# Patient Record
Sex: Female | Born: 1965 | Race: White | Hispanic: No | Marital: Married | Smoking: Never smoker
Health system: Southern US, Community
[De-identification: ages and names within clinical notes are randomized; demographics above are authoritative.]

## PROBLEM LIST (undated history)

## (undated) DIAGNOSIS — K219 Gastro-esophageal reflux disease without esophagitis: Secondary | ICD-10-CM

## (undated) DIAGNOSIS — T4145XA Adverse effect of unspecified anesthetic, initial encounter: Secondary | ICD-10-CM

## (undated) DIAGNOSIS — G510 Bell's palsy: Secondary | ICD-10-CM

## (undated) DIAGNOSIS — I499 Cardiac arrhythmia, unspecified: Secondary | ICD-10-CM

## (undated) DIAGNOSIS — J189 Pneumonia, unspecified organism: Secondary | ICD-10-CM

## (undated) DIAGNOSIS — T8859XA Other complications of anesthesia, initial encounter: Secondary | ICD-10-CM

## (undated) DIAGNOSIS — IMO0001 Reserved for inherently not codable concepts without codable children: Secondary | ICD-10-CM

## (undated) DIAGNOSIS — E119 Type 2 diabetes mellitus without complications: Secondary | ICD-10-CM

## (undated) DIAGNOSIS — R42 Dizziness and giddiness: Secondary | ICD-10-CM

## (undated) DIAGNOSIS — I639 Cerebral infarction, unspecified: Secondary | ICD-10-CM

## (undated) DIAGNOSIS — J45909 Unspecified asthma, uncomplicated: Secondary | ICD-10-CM

## (undated) HISTORY — PX: CYSTOSCOPY: SHX5120

## (undated) HISTORY — DX: Cerebral infarction, unspecified: I63.9

## (undated) HISTORY — DX: Unspecified asthma, uncomplicated: J45.909

## (undated) HISTORY — PX: EYE SURGERY: SHX253

## (undated) HISTORY — DX: Bell's palsy: G51.0

---

## 1995-06-16 HISTORY — PX: CHOLECYSTECTOMY: SHX55

## 1998-04-19 ENCOUNTER — Ambulatory Visit (HOSPITAL_COMMUNITY): Admission: RE | Admit: 1998-04-19 | Discharge: 1998-04-19 | Payer: Self-pay | Admitting: Infectious Diseases

## 1998-04-19 ENCOUNTER — Encounter: Admission: RE | Admit: 1998-04-19 | Discharge: 1998-04-19 | Payer: Self-pay | Admitting: Infectious Diseases

## 1999-04-18 ENCOUNTER — Emergency Department (HOSPITAL_COMMUNITY): Admission: EM | Admit: 1999-04-18 | Discharge: 1999-04-18 | Payer: Self-pay | Admitting: Emergency Medicine

## 2001-11-01 ENCOUNTER — Emergency Department (HOSPITAL_COMMUNITY): Admission: EM | Admit: 2001-11-01 | Discharge: 2001-11-01 | Payer: Self-pay | Admitting: *Deleted

## 2004-04-29 ENCOUNTER — Other Ambulatory Visit: Admission: RE | Admit: 2004-04-29 | Discharge: 2004-04-29 | Payer: Self-pay

## 2004-05-31 ENCOUNTER — Emergency Department (HOSPITAL_COMMUNITY): Admission: EM | Admit: 2004-05-31 | Discharge: 2004-05-31 | Payer: Self-pay | Admitting: Emergency Medicine

## 2005-07-29 ENCOUNTER — Emergency Department (HOSPITAL_COMMUNITY): Admission: EM | Admit: 2005-07-29 | Discharge: 2005-07-29 | Payer: Self-pay | Admitting: Emergency Medicine

## 2005-11-09 ENCOUNTER — Emergency Department (HOSPITAL_COMMUNITY): Admission: EM | Admit: 2005-11-09 | Discharge: 2005-11-09 | Payer: Self-pay | Admitting: Emergency Medicine

## 2006-03-24 ENCOUNTER — Emergency Department (HOSPITAL_COMMUNITY): Admission: EM | Admit: 2006-03-24 | Discharge: 2006-03-24 | Payer: Self-pay | Admitting: Emergency Medicine

## 2006-03-29 ENCOUNTER — Ambulatory Visit: Payer: Self-pay | Admitting: Cardiology

## 2006-11-13 ENCOUNTER — Emergency Department (HOSPITAL_COMMUNITY): Admission: EM | Admit: 2006-11-13 | Discharge: 2006-11-14 | Payer: Self-pay | Admitting: Emergency Medicine

## 2006-11-30 ENCOUNTER — Emergency Department (HOSPITAL_COMMUNITY): Admission: EM | Admit: 2006-11-30 | Discharge: 2006-11-30 | Payer: Self-pay | Admitting: Emergency Medicine

## 2007-05-24 ENCOUNTER — Other Ambulatory Visit: Admission: RE | Admit: 2007-05-24 | Discharge: 2007-05-24 | Payer: Self-pay | Admitting: Unknown Physician Specialty

## 2007-05-24 ENCOUNTER — Encounter (INDEPENDENT_AMBULATORY_CARE_PROVIDER_SITE_OTHER): Payer: Self-pay | Admitting: Unknown Physician Specialty

## 2008-06-05 ENCOUNTER — Emergency Department (HOSPITAL_COMMUNITY): Admission: EM | Admit: 2008-06-05 | Discharge: 2008-06-05 | Payer: Self-pay | Admitting: Emergency Medicine

## 2008-06-15 HISTORY — PX: TONSILLECTOMY: SUR1361

## 2010-06-15 ENCOUNTER — Emergency Department (HOSPITAL_COMMUNITY)
Admission: EM | Admit: 2010-06-15 | Discharge: 2010-06-15 | Payer: Self-pay | Source: Home / Self Care | Admitting: Emergency Medicine

## 2010-08-25 LAB — URINALYSIS, ROUTINE W REFLEX MICROSCOPIC
Bilirubin Urine: NEGATIVE
Glucose, UA: NEGATIVE mg/dL
Hgb urine dipstick: NEGATIVE
Ketones, ur: NEGATIVE mg/dL
Nitrite: NEGATIVE
Protein, ur: NEGATIVE mg/dL
Specific Gravity, Urine: 1.02 (ref 1.005–1.030)
Urobilinogen, UA: 0.2 mg/dL (ref 0.0–1.0)
pH: 6.5 (ref 5.0–8.0)

## 2010-08-25 LAB — URINE MICROSCOPIC-ADD ON

## 2010-08-25 LAB — WET PREP, GENITAL
Trich, Wet Prep: NONE SEEN
Yeast Wet Prep HPF POC: NONE SEEN

## 2010-08-25 LAB — GC/CHLAMYDIA PROBE AMP, GENITAL
Chlamydia, DNA Probe: NEGATIVE
GC Probe Amp, Genital: NEGATIVE

## 2010-08-25 LAB — POCT PREGNANCY, URINE: Preg Test, Ur: NEGATIVE

## 2013-06-15 HISTORY — PX: COLONOSCOPY: SHX174

## 2013-10-18 ENCOUNTER — Other Ambulatory Visit: Payer: Self-pay | Admitting: Internal Medicine

## 2013-10-18 DIAGNOSIS — R1011 Right upper quadrant pain: Secondary | ICD-10-CM

## 2013-10-19 ENCOUNTER — Other Ambulatory Visit: Payer: Self-pay

## 2013-10-20 ENCOUNTER — Ambulatory Visit
Admission: RE | Admit: 2013-10-20 | Discharge: 2013-10-20 | Disposition: A | Discharge status: Home / Self Care | Source: Ambulatory Visit | Attending: Internal Medicine | Admitting: Internal Medicine

## 2013-10-20 DIAGNOSIS — R1011 Right upper quadrant pain: Secondary | ICD-10-CM

## 2013-10-24 ENCOUNTER — Other Ambulatory Visit: Payer: Self-pay | Admitting: Internal Medicine

## 2013-10-24 DIAGNOSIS — R109 Unspecified abdominal pain: Secondary | ICD-10-CM

## 2013-10-27 ENCOUNTER — Ambulatory Visit
Admission: RE | Admit: 2013-10-27 | Discharge: 2013-10-27 | Disposition: A | Discharge status: Home / Self Care | Source: Ambulatory Visit | Attending: Internal Medicine | Admitting: Internal Medicine

## 2013-10-27 DIAGNOSIS — R109 Unspecified abdominal pain: Secondary | ICD-10-CM

## 2013-10-27 MED ORDER — IOHEXOL 300 MG/ML  SOLN
100.0000 mL | Freq: Once | INTRAMUSCULAR | Status: AC | PRN
  Administered 2013-10-27: 100 mL via INTRAVENOUS

## 2013-12-19 ENCOUNTER — Other Ambulatory Visit: Payer: Self-pay | Admitting: Gastroenterology

## 2014-02-26 ENCOUNTER — Other Ambulatory Visit: Payer: Self-pay | Admitting: Internal Medicine

## 2014-02-26 DIAGNOSIS — M25512 Pain in left shoulder: Secondary | ICD-10-CM

## 2014-03-08 ENCOUNTER — Ambulatory Visit
Admission: RE | Admit: 2014-03-08 | Discharge: 2014-03-08 | Disposition: A | Discharge status: Home / Self Care | Source: Ambulatory Visit | Attending: Internal Medicine | Admitting: Internal Medicine

## 2014-03-08 DIAGNOSIS — M25512 Pain in left shoulder: Secondary | ICD-10-CM

## 2014-10-30 ENCOUNTER — Other Ambulatory Visit (HOSPITAL_COMMUNITY): Payer: Self-pay | Admitting: Orthopedic Surgery

## 2014-11-08 NOTE — Pre-Procedure Instructions (Signed)
    Melissa [name]  11/08/2014       Your procedure is scheduled on Tuesday, May 7.  [facility] at 5:30 A.M.   Call this number if you have problems the morning of surgery: [phone]              For any other questions, please call [phone], Monday - Friday 8 AM - 4 PM.     Remember:  Do not eat food or drink liquids after midnight Monday, June 6  Take these medicines the morning of surgery with A SIP OF WATER : nebivolol (BYSTOLIC).               Do not take sitaGLIPtin (JANUVIA), metFORMIN (GLUCOPHAGE) the morning of surgery.   Do not wear jewelry, make-up or nail polish.  Do not wear lotions, powders, or perfumes.    Do not shave 48 hours prior to surgery.    Do not bring valuables to the hospital.  [facility] is not responsible for any belongings or valuables.  Contacts, dentures or bridgework may not be worn into surgery.  Leave your suitcase in the car.  After surgery it may be brought to your room.  For patients admitted to the hospital, discharge time will be determined by your treatment team.  Patients discharged the day of surgery will not be allowed to drive home.   Name and phone number of your driver:  -  Special instructions:  Review  [place] - Preparing For Surgery.  Please read over the following fact sheets that you were given. Pain Booklet, Coughing and Deep Breathing and Surgical Site Infection Prevention, How to Manage Your Diabetes Before and After Surgery and What Do I Do About My Diabetes Medications?

## 2014-11-09 ENCOUNTER — Encounter (HOSPITAL_COMMUNITY)
Admission: RE | Admit: 2014-11-09 | Discharge: 2014-11-09 | Disposition: A | Discharge status: Home / Self Care | Source: Ambulatory Visit | Attending: Orthopedic Surgery | Admitting: Orthopedic Surgery

## 2014-11-09 ENCOUNTER — Encounter (HOSPITAL_COMMUNITY): Payer: Self-pay | Admitting: *Deleted

## 2014-11-09 ENCOUNTER — Encounter (HOSPITAL_COMMUNITY): Payer: Self-pay

## 2014-11-09 ENCOUNTER — Other Ambulatory Visit (HOSPITAL_COMMUNITY): Payer: Self-pay | Admitting: Orthopedic Surgery

## 2014-11-09 DIAGNOSIS — E119 Type 2 diabetes mellitus without complications: Secondary | ICD-10-CM | POA: Diagnosis not present

## 2014-11-09 DIAGNOSIS — Z881 Allergy status to other antibiotic agents status: Secondary | ICD-10-CM | POA: Diagnosis not present

## 2014-11-09 DIAGNOSIS — M7502 Adhesive capsulitis of left shoulder: Secondary | ICD-10-CM | POA: Diagnosis not present

## 2014-11-09 DIAGNOSIS — Z888 Allergy status to other drugs, medicaments and biological substances status: Secondary | ICD-10-CM | POA: Diagnosis not present

## 2014-11-09 DIAGNOSIS — Z880 Allergy status to penicillin: Secondary | ICD-10-CM | POA: Diagnosis not present

## 2014-11-09 DIAGNOSIS — K219 Gastro-esophageal reflux disease without esophagitis: Secondary | ICD-10-CM | POA: Diagnosis not present

## 2014-11-09 DIAGNOSIS — Z6841 Body Mass Index (BMI) 40.0 and over, adult: Secondary | ICD-10-CM | POA: Diagnosis not present

## 2014-11-09 DIAGNOSIS — Z01812 Encounter for preprocedural laboratory examination: Secondary | ICD-10-CM | POA: Diagnosis present

## 2014-11-09 DIAGNOSIS — Z9049 Acquired absence of other specified parts of digestive tract: Secondary | ICD-10-CM | POA: Diagnosis not present

## 2014-11-09 DIAGNOSIS — Z794 Long term (current) use of insulin: Secondary | ICD-10-CM | POA: Diagnosis not present

## 2014-11-09 DIAGNOSIS — Z886 Allergy status to analgesic agent status: Secondary | ICD-10-CM | POA: Diagnosis not present

## 2014-11-09 DIAGNOSIS — Z882 Allergy status to sulfonamides status: Secondary | ICD-10-CM | POA: Diagnosis not present

## 2014-11-09 HISTORY — DX: Reserved for inherently not codable concepts without codable children: IMO0001

## 2014-11-09 HISTORY — DX: Pneumonia, unspecified organism: J18.9

## 2014-11-09 HISTORY — DX: Gastro-esophageal reflux disease without esophagitis: K21.9

## 2014-11-09 LAB — HCG, SERUM, QUALITATIVE: Preg, Serum: NEGATIVE

## 2014-11-09 LAB — CBC
HEMATOCRIT: 40.7 % (ref 36.0–46.0)
HEMOGLOBIN: 13.3 g/dL (ref 12.0–15.0)
MCH: 27.5 pg (ref 26.0–34.0)
MCHC: 32.7 g/dL (ref 30.0–36.0)
MCV: 84.1 fL (ref 78.0–100.0)
PLATELETS: 283 10*3/uL (ref 150–400)
RBC: 4.84 MIL/uL (ref 3.87–5.11)
RDW: 14.1 % (ref 11.5–15.5)
WBC: 7.8 10*3/uL (ref 4.0–10.5)

## 2014-11-09 LAB — BASIC METABOLIC PANEL
Anion gap: 7 (ref 5–15)
BUN: 11 mg/dL (ref 6–20)
CO2: 26 mmol/L (ref 22–32)
CREATININE: 0.71 mg/dL (ref 0.44–1.00)
Calcium: 9.4 mg/dL (ref 8.9–10.3)
Chloride: 105 mmol/L (ref 101–111)
GFR calc non Af Amer: >60 mL/min (ref >60)
Glucose, Bld: 251 mg/dL — ABNORMAL HIGH (ref 65–99)
Potassium: 4.4 mmol/L (ref 3.5–5.1)
SODIUM: 138 mmol/L (ref 135–145)

## 2014-11-09 LAB — GLUCOSE, CAPILLARY: Glucose-Capillary: 251 mg/dL — ABNORMAL HIGH (ref 65–99)

## 2014-11-09 NOTE — Progress Notes (Addendum)
Mrs Melissa [name] has Type II diabetes , PCP Dr Claris GowerKahl manages diabetes .  Last A1C was "9 something" and was drawn in May.  50 units Insulin Glargine (Toujeo)  Added recently.  Patient reports fasting runs high and she does take sliding scale Humulog.I instructed patient to take 40 Units of Toujeo the evening before surgery and if CBC > 220 the morning of surgery to take 1/2 of sliding scale dose.   Patient verbalized understanding.  I [facility] labs and [facility].  Patient has a history of palpations and tacycardia had an Echo cardiogram in 2012- "was normal", " I was drinking too much caffeine.  Patient reports that she no longer has palpations, drinking less caffeine. [facility] by Dr Claris GowerKahl, cardiologist.  A1C was 9.0 when drawn 10/22/14.  Dr Claris GowerKahl added Nathen Mayoujeo at that time

## 2014-11-19 MED ORDER — CLINDAMYCIN PHOSPHATE 900 MG/50ML IV SOLN
900.0000 mg | INTRAVENOUS | Status: DC
  Filled 2014-11-19: qty 50

## 2014-11-20 ENCOUNTER — Ambulatory Visit (HOSPITAL_COMMUNITY): Admitting: Anesthesiology

## 2014-11-20 ENCOUNTER — Encounter (HOSPITAL_COMMUNITY)
Admission: RE | Disposition: A | Discharge status: Home / Self Care | Payer: Self-pay | Source: Ambulatory Visit | Attending: Orthopedic Surgery

## 2014-11-20 ENCOUNTER — Ambulatory Visit (HOSPITAL_COMMUNITY)
Admission: RE | Admit: 2014-11-20 | Discharge: 2014-11-20 | Disposition: A | Discharge status: Home / Self Care | Source: Ambulatory Visit | Attending: Orthopedic Surgery | Admitting: Orthopedic Surgery

## 2014-11-20 ENCOUNTER — Encounter (HOSPITAL_COMMUNITY): Payer: Self-pay | Admitting: *Deleted

## 2014-11-20 DIAGNOSIS — K219 Gastro-esophageal reflux disease without esophagitis: Secondary | ICD-10-CM | POA: Insufficient documentation

## 2014-11-20 DIAGNOSIS — Z6841 Body Mass Index (BMI) 40.0 and over, adult: Secondary | ICD-10-CM | POA: Insufficient documentation

## 2014-11-20 DIAGNOSIS — Z882 Allergy status to sulfonamides status: Secondary | ICD-10-CM | POA: Insufficient documentation

## 2014-11-20 DIAGNOSIS — Z881 Allergy status to other antibiotic agents status: Secondary | ICD-10-CM | POA: Insufficient documentation

## 2014-11-20 DIAGNOSIS — E119 Type 2 diabetes mellitus without complications: Secondary | ICD-10-CM | POA: Insufficient documentation

## 2014-11-20 DIAGNOSIS — Z886 Allergy status to analgesic agent status: Secondary | ICD-10-CM | POA: Insufficient documentation

## 2014-11-20 DIAGNOSIS — M7502 Adhesive capsulitis of left shoulder: Secondary | ICD-10-CM | POA: Insufficient documentation

## 2014-11-20 DIAGNOSIS — Z794 Long term (current) use of insulin: Secondary | ICD-10-CM | POA: Insufficient documentation

## 2014-11-20 DIAGNOSIS — Z888 Allergy status to other drugs, medicaments and biological substances status: Secondary | ICD-10-CM | POA: Insufficient documentation

## 2014-11-20 DIAGNOSIS — Z880 Allergy status to penicillin: Secondary | ICD-10-CM | POA: Insufficient documentation

## 2014-11-20 DIAGNOSIS — Z9049 Acquired absence of other specified parts of digestive tract: Secondary | ICD-10-CM | POA: Insufficient documentation

## 2014-11-20 HISTORY — DX: Other complications of anesthesia, initial encounter: T88.59XA

## 2014-11-20 HISTORY — DX: Adverse effect of unspecified anesthetic, initial encounter: T41.45XA

## 2014-11-20 HISTORY — DX: Cardiac arrhythmia, unspecified: I49.9

## 2014-11-20 HISTORY — PX: SHOULDER ARTHROSCOPY: SHX128

## 2014-11-20 HISTORY — DX: Type 2 diabetes mellitus without complications: E11.9

## 2014-11-20 LAB — GLUCOSE, CAPILLARY
GLUCOSE-CAPILLARY: 190 mg/dL — AB (ref 65–99)
Glucose-Capillary: 142 mg/dL — ABNORMAL HIGH (ref 65–99)

## 2014-11-20 SURGERY — ARTHROSCOPY, SHOULDER
Anesthesia: Regional | Site: Shoulder | Laterality: Left

## 2014-11-20 MED ORDER — SUCCINYLCHOLINE CHLORIDE 20 MG/ML IJ SOLN
INTRAMUSCULAR | Status: DC | PRN
  Administered 2014-11-20: 100 mg via INTRAVENOUS

## 2014-11-20 MED ORDER — MIDAZOLAM HCL 2 MG/2ML IJ SOLN
INTRAMUSCULAR | Status: AC
  Filled 2014-11-20: qty 2

## 2014-11-20 MED ORDER — CHLORHEXIDINE GLUCONATE 4 % EX LIQD: 60.0000 mL | Freq: Once | CUTANEOUS | Status: DC

## 2014-11-20 MED ORDER — EPINEPHRINE HCL 1 MG/ML IJ SOLN
INTRAMUSCULAR | Status: DC | PRN
  Administered 2014-11-20: .1 mL

## 2014-11-20 MED ORDER — FENTANYL CITRATE (PF) 100 MCG/2ML IJ SOLN
INTRAMUSCULAR | Status: DC | PRN
  Administered 2014-11-20: 100 ug via INTRAVENOUS

## 2014-11-20 MED ORDER — NEOSTIGMINE METHYLSULFATE 10 MG/10ML IV SOLN
INTRAVENOUS | Status: DC | PRN
  Administered 2014-11-20: 5 mg via INTRAVENOUS

## 2014-11-20 MED ORDER — PHENYLEPHRINE 40 MCG/ML (10ML) SYRINGE FOR IV PUSH (FOR BLOOD PRESSURE SUPPORT)
PREFILLED_SYRINGE | INTRAVENOUS | Status: AC
  Filled 2014-11-20: qty 20

## 2014-11-20 MED ORDER — KETOROLAC TROMETHAMINE 30 MG/ML IJ SOLN
INTRAMUSCULAR | Status: DC | PRN
  Administered 2014-11-20: 30 mg via INTRAMUSCULAR

## 2014-11-20 MED ORDER — SODIUM CHLORIDE 0.9 % IJ SOLN
INTRAMUSCULAR | Status: DC | PRN
  Administered 2014-11-20: 30 mL

## 2014-11-20 MED ORDER — SODIUM CHLORIDE 0.9 % IR SOLN
Status: DC | PRN
  Administered 2014-11-20: 9000 mL

## 2014-11-20 MED ORDER — VANCOMYCIN HCL IN DEXTROSE 1-5 GM/200ML-% IV SOLN
1000.0000 mg | INTRAVENOUS | Status: DC
  Filled 2014-11-20: qty 200

## 2014-11-20 MED ORDER — MORPHINE SULFATE 4 MG/ML IJ SOLN
INTRAMUSCULAR | Status: DC | PRN
  Administered 2014-11-20: 8 mg via INTRAMUSCULAR

## 2014-11-20 MED ORDER — LIDOCAINE HCL (CARDIAC) 20 MG/ML IV SOLN
INTRAVENOUS | Status: DC | PRN
  Administered 2014-11-20: 50 mg via INTRAVENOUS

## 2014-11-20 MED ORDER — BUPIVACAINE-EPINEPHRINE (PF) 0.5% -1:200000 IJ SOLN
INTRAMUSCULAR | Status: AC
  Filled 2014-11-20: qty 30

## 2014-11-20 MED ORDER — GLYCOPYRROLATE 0.2 MG/ML IJ SOLN
INTRAMUSCULAR | Status: AC
  Filled 2014-11-20: qty 3

## 2014-11-20 MED ORDER — PHENYLEPHRINE HCL 10 MG/ML IJ SOLN
INTRAMUSCULAR | Status: DC | PRN
  Administered 2014-11-20: 80 ug via INTRAVENOUS
  Administered 2014-11-20: 40 ug via INTRAVENOUS
  Administered 2014-11-20 (×2): 80 ug via INTRAVENOUS
  Administered 2014-11-20 (×2): 40 ug via INTRAVENOUS

## 2014-11-20 MED ORDER — VANCOMYCIN HCL 1000 MG IV SOLR
1000.0000 mg | INTRAVENOUS | Status: DC | PRN
  Administered 2014-11-20: 1000 mg via INTRAVENOUS

## 2014-11-20 MED ORDER — MORPHINE SULFATE 4 MG/ML IJ SOLN
INTRAMUSCULAR | Status: AC
  Filled 2014-11-20: qty 2

## 2014-11-20 MED ORDER — OXYCODONE-ACETAMINOPHEN 5-325 MG PO TABS: 1.0000 | ORAL_TABLET | Freq: Four times a day (QID) | ORAL | Status: DC | PRN

## 2014-11-20 MED ORDER — BUPIVACAINE-EPINEPHRINE (PF) 0.5% -1:200000 IJ SOLN
INTRAMUSCULAR | Status: DC | PRN
  Administered 2014-11-20: 25 mL via PERINEURAL

## 2014-11-20 MED ORDER — LACTATED RINGERS IV SOLN
INTRAVENOUS | Status: DC | PRN
  Administered 2014-11-20 (×2): via INTRAVENOUS

## 2014-11-20 MED ORDER — MEPERIDINE HCL 25 MG/ML IJ SOLN: 6.2500 mg | INTRAMUSCULAR | Status: DC | PRN

## 2014-11-20 MED ORDER — GLYCOPYRROLATE 0.2 MG/ML IJ SOLN
INTRAMUSCULAR | Status: DC | PRN
  Administered 2014-11-20: 0.6 mg via INTRAVENOUS

## 2014-11-20 MED ORDER — NEOSTIGMINE METHYLSULFATE 10 MG/10ML IV SOLN
INTRAVENOUS | Status: AC
  Filled 2014-11-20: qty 1

## 2014-11-20 MED ORDER — HYDROMORPHONE HCL 1 MG/ML IJ SOLN
0.2500 mg | INTRAMUSCULAR | Status: DC | PRN
  Administered 2014-11-20 (×2): 0.5 mg via INTRAVENOUS

## 2014-11-20 MED ORDER — ONDANSETRON HCL 4 MG/2ML IJ SOLN
INTRAMUSCULAR | Status: DC | PRN
  Administered 2014-11-20: 4 mg via INTRAVENOUS

## 2014-11-20 MED ORDER — MIDAZOLAM HCL 5 MG/5ML IJ SOLN
INTRAMUSCULAR | Status: DC | PRN
  Administered 2014-11-20: 2 mg via INTRAVENOUS

## 2014-11-20 MED ORDER — PROPOFOL 10 MG/ML IV BOLUS
INTRAVENOUS | Status: AC
  Filled 2014-11-20: qty 20

## 2014-11-20 MED ORDER — HYDROMORPHONE HCL 1 MG/ML IJ SOLN
INTRAMUSCULAR | Status: DC
Start: 2014-11-20 — End: 2014-11-20
  Filled 2014-11-20: qty 1

## 2014-11-20 MED ORDER — FENTANYL CITRATE (PF) 250 MCG/5ML IJ SOLN
INTRAMUSCULAR | Status: AC
  Filled 2014-11-20: qty 5

## 2014-11-20 MED ORDER — OXYCODONE HCL 5 MG/5ML PO SOLN: 5.0000 mg | Freq: Once | ORAL | Status: DC | PRN

## 2014-11-20 MED ORDER — OXYCODONE HCL 5 MG PO TABS: 5.0000 mg | ORAL_TABLET | Freq: Once | ORAL | Status: DC | PRN

## 2014-11-20 MED ORDER — ROCURONIUM BROMIDE 100 MG/10ML IV SOLN
INTRAVENOUS | Status: DC | PRN
  Administered 2014-11-20 (×2): 10 mg via INTRAVENOUS

## 2014-11-20 MED ORDER — EPINEPHRINE HCL 1 MG/ML IJ SOLN
INTRAMUSCULAR | Status: AC
  Filled 2014-11-20: qty 1

## 2014-11-20 MED ORDER — BUPIVACAINE-EPINEPHRINE (PF) 0.5% -1:200000 IJ SOLN
INTRAMUSCULAR | Status: DC | PRN
  Administered 2014-11-20: 10 mL

## 2014-11-20 MED ORDER — SCOPOLAMINE 1 MG/3DAYS TD PT72: 1.0000 | MEDICATED_PATCH | TRANSDERMAL | Status: DC

## 2014-11-20 MED ORDER — PROPOFOL 10 MG/ML IV BOLUS
INTRAVENOUS | Status: DC | PRN
  Administered 2014-11-20: 180 mg via INTRAVENOUS

## 2014-11-20 MED ORDER — ONDANSETRON HCL 4 MG/2ML IJ SOLN
INTRAMUSCULAR | Status: AC
  Filled 2014-11-20: qty 2

## 2014-11-20 SURGICAL SUPPLY — 74 items
BENZOIN TINCTURE PRP APPL 2/3 (GAUZE/BANDAGES/DRESSINGS) ×3 IMPLANT
BIT DRILL TAK (DRILL) IMPLANT
BLADE CUDA 5.5 (BLADE) IMPLANT
BLADE CUTTER GATOR 3.5 (BLADE) ×3 IMPLANT
BLADE GREAT WHITE 4.2 (BLADE) ×2 IMPLANT
BLADE GREAT WHITE 4.2MM (BLADE) ×1
BLADE SURG 11 STRL SS (BLADE) ×3 IMPLANT
BUR GATOR 2.9 (BURR) IMPLANT
BUR GATOR 2.9MM (BURR)
BUR OVAL 4.0 (BURR) ×3 IMPLANT
BUR OVAL 6.0 (BURR) ×3 IMPLANT
CANNULA SHOULDER 7CM (CANNULA) IMPLANT
CARTRIDGE CURVETEK MED (MISCELLANEOUS) IMPLANT
CARTRIDGE CURVETEK XLRG (MISCELLANEOUS) IMPLANT
CLOSURE WOUND 1/2 X4 (GAUZE/BANDAGES/DRESSINGS) ×1
COVER SURGICAL LIGHT HANDLE (MISCELLANEOUS) ×3 IMPLANT
DRAPE IMP U-DRAPE 54X76 (DRAPES) ×3 IMPLANT
DRAPE INCISE IOBAN 66X45 STRL (DRAPES) ×6 IMPLANT
DRAPE STERI 35X30 U-POUCH (DRAPES) ×3 IMPLANT
DRAPE U-SHAPE 47X51 STRL (DRAPES) ×6 IMPLANT
DRILL TAK (DRILL)
DRSG MEPILEX BORDER 4X4 (GAUZE/BANDAGES/DRESSINGS) ×3 IMPLANT
DRSG MEPILEX BORDER 4X8 (GAUZE/BANDAGES/DRESSINGS) ×3 IMPLANT
DRSG PAD ABDOMINAL 8X10 ST (GAUZE/BANDAGES/DRESSINGS) ×9 IMPLANT
DURAPREP 26ML APPLICATOR (WOUND CARE) ×3 IMPLANT
ELECT MENISCUS 165MM 90D (ELECTRODE) IMPLANT
ELECT REM PT RETURN 9FT ADLT (ELECTROSURGICAL) ×3
ELECTRODE REM PT RTRN 9FT ADLT (ELECTROSURGICAL) ×1 IMPLANT
FILTER STRAW FLUID ASPIR (MISCELLANEOUS) ×3 IMPLANT
GAUZE SPONGE 4X4 12PLY STRL (GAUZE/BANDAGES/DRESSINGS) ×3 IMPLANT
GAUZE XEROFORM 1X8 LF (GAUZE/BANDAGES/DRESSINGS) ×3 IMPLANT
GLOVE BIO SURGEON ST LM GN SZ9 (GLOVE) ×3 IMPLANT
GLOVE BIOGEL PI IND STRL 8 (GLOVE) ×1 IMPLANT
GLOVE BIOGEL PI INDICATOR 8 (GLOVE) ×2
GLOVE SURG ORTHO 8.0 STRL STRW (GLOVE) ×3 IMPLANT
GOWN STRL REUS W/ TWL LRG LVL3 (GOWN DISPOSABLE) ×3 IMPLANT
GOWN STRL REUS W/TWL LRG LVL3 (GOWN DISPOSABLE) ×6
KIT BASIN OR (CUSTOM PROCEDURE TRAY) ×3 IMPLANT
KIT ROOM TURNOVER OR (KITS) ×3 IMPLANT
MANIFOLD NEPTUNE II (INSTRUMENTS) ×3 IMPLANT
NDL SUT 6 .5 CRC .975X.05 MAYO (NEEDLE) ×1 IMPLANT
NEEDLE HYPO 25X1 1.5 SAFETY (NEEDLE) ×3 IMPLANT
NEEDLE MAYO TAPER (NEEDLE) ×2
NEEDLE SPNL 18GX3.5 QUINCKE PK (NEEDLE) ×3 IMPLANT
NS IRRIG 1000ML POUR BTL (IV SOLUTION) ×3 IMPLANT
PACK SHOULDER (CUSTOM PROCEDURE TRAY) ×3 IMPLANT
PACK UNIVERSAL I (CUSTOM PROCEDURE TRAY) ×3 IMPLANT
PAD ARMBOARD 7.5X6 YLW CONV (MISCELLANEOUS) ×6 IMPLANT
SET ARTHROSCOPY TUBING (MISCELLANEOUS) ×2
SET ARTHROSCOPY TUBING LN (MISCELLANEOUS) ×1 IMPLANT
SLING ARM IMMOBILIZER MED (SOFTGOODS) IMPLANT
SLING ARM IMMOBILIZER XL (CAST SUPPLIES) ×3 IMPLANT
SPEAR FASTAKII (SLEEVE) IMPLANT
SPONGE LAP 4X18 X RAY DECT (DISPOSABLE) ×6 IMPLANT
STRIP CLOSURE SKIN 1/2X4 (GAUZE/BANDAGES/DRESSINGS) ×2 IMPLANT
SUCTION FRAZIER TIP 10 FR DISP (SUCTIONS) ×3 IMPLANT
SUT ETHILON 3 0 PS 1 (SUTURE) ×3 IMPLANT
SUT FIBERWIRE 2-0 18 17.9 3/8 (SUTURE)
SUT PROLENE 3 0 PS 2 (SUTURE) ×3 IMPLANT
SUT VIC AB 0 CT1 27 (SUTURE) ×4
SUT VIC AB 0 CT1 27XBRD ANBCTR (SUTURE) ×2 IMPLANT
SUT VIC AB 1 CT1 27 (SUTURE) ×2
SUT VIC AB 1 CT1 27XBRD ANBCTR (SUTURE) ×1 IMPLANT
SUT VIC AB 2-0 CT1 27 (SUTURE) ×2
SUT VIC AB 2-0 CT1 TAPERPNT 27 (SUTURE) ×1 IMPLANT
SUT VICRYL 0 UR6 27IN ABS (SUTURE) IMPLANT
SUTURE FIBERWR 2-0 18 17.9 3/8 (SUTURE) IMPLANT
SYR 20CC LL (SYRINGE) ×6 IMPLANT
SYR 3ML LL SCALE MARK (SYRINGE) ×3 IMPLANT
SYR TB 1ML LUER SLIP (SYRINGE) ×3 IMPLANT
TOWEL OR 17X24 6PK STRL BLUE (TOWEL DISPOSABLE) ×3 IMPLANT
TOWEL OR 17X26 10 PK STRL BLUE (TOWEL DISPOSABLE) ×3 IMPLANT
WAND HAND CNTRL MULTIVAC 90 (MISCELLANEOUS) IMPLANT
WATER STERILE IRR 1000ML POUR (IV SOLUTION) ×3 IMPLANT

## 2014-11-20 NOTE — Anesthesia Preprocedure Evaluation (Signed)
Anesthesia Evaluation  Patient identified by MRN, date of birth, ID band Patient awake    Reviewed: Allergy & Precautions, NPO status , Patient's Chart, lab work & pertinent test results  History of Anesthesia Complications (+) PONV  Airway Mallampati: I  TM Distance: >3 FB Neck ROM: Full    Dental  (+) Teeth Intact, Dental Advisory Given   Pulmonary  breath sounds clear to auscultation        Cardiovascular Rhythm:Regular Rate:Normal     Neuro/Psych    GI/Hepatic GERD-  Medicated and Controlled,  Endo/Other  diabetes, Well Controlled, Type 2, Insulin DependentMorbid obesity  Renal/GU      Musculoskeletal   Abdominal   Peds  Hematology   Anesthesia Other Findings   Reproductive/Obstetrics                             Anesthesia Physical Anesthesia Plan  ASA: III  Anesthesia Plan: General and Regional   Post-op Pain Management:    Induction: Intravenous  Airway Management Planned: Oral ETT  Additional Equipment:   Intra-op Plan:   Post-operative Plan: Extubation in OR  Informed Consent: I have reviewed the patients History and Physical, chart, labs and discussed the procedure including the risks, benefits and alternatives for the proposed anesthesia with the patient or authorized representative who has indicated his/her understanding and acceptance.   Dental advisory given  Plan Discussed with: CRNA, Surgeon and Anesthesiologist  Anesthesia Plan Comments:         Anesthesia Quick Evaluation

## 2014-11-20 NOTE — Anesthesia Postprocedure Evaluation (Signed)
Anesthesia Post Note  Patient: Melissa [name]  Procedure(s) Performed: Procedure(s) (LRB): ARTHROSCOPY SHOULDER WITH MUA, ROTATOR INTERVAL RELEASE (Left)  Anesthesia type: general  Patient location: PACU  Post pain: Pain level controlled  Post assessment: Patient's Cardiovascular Status Stable  Last Vitals:  Filed Vitals:   11/20/14 1024  BP: 122/70  Pulse: 85  Temp:   Resp: 18    Post vital signs: Reviewed and stable  Level of consciousness: sedated  Complications: No apparent anesthesia complications

## 2014-11-20 NOTE — Transfer of Care (Signed)
Immediate Anesthesia Transfer of Care Note  Patient: Melissa [name]  Procedure(s) Performed: Procedure(s) with comments: ARTHROSCOPY SHOULDER WITH MUA, ROTATOR INTERVAL RELEASE (Left) - LEFT SHOULDER MUA, DOA, ROTATOR INTERVAL RELEASE.  Patient Location: PACU  Anesthesia Type:General  Level of Consciousness: awake, alert , oriented, patient cooperative and responds to stimulation  Airway & Oxygen Therapy: Patient Spontanous Breathing and Patient connected to nasal cannula oxygen  Post-op Assessment: Report given to RN, Post -op Vital signs reviewed and stable and Patient able to stick tongue midline  Post vital signs: stable  Last Vitals:  Filed Vitals:   11/20/14 0548  BP: 116/47  Pulse: 91  Temp: 36.8 C  Resp: 20    Complications: No apparent anesthesia complications

## 2014-11-20 NOTE — Op Note (Signed)
Melissa [name], Melissa [name]                   ACCOUNT NO.:  [id number]  MEDICAL RECORD NO.:  [id number]  LOCATION:  [facility]                         FACILITY:  [facility]  PHYSICIAN:  Burnard BuntingG. Scott Dean, M.D.    DATE OF BIRTH:  1965-10-22  DATE OF PROCEDURE: DATE OF DISCHARGE:  11/20/2014                              OPERATIVE REPORT   PREOPERATIVE DIAGNOSIS:  Left frozen shoulder.  PREOPERATIVE DIAGNOSIS:  Left frozen shoulder.  PROCEDURE:  Left shoulder manipulation under anesthesia with arthroscopy, rotator interval release and subacromial bursectomy without acromioplasty and without coracoacromial ligament release.  ATTENDING SURGEON:  Burnard BuntingG. Scott Dean, M.D.  ASSISTANT:  Patrick Jupiterarla Bethune, RNFA.  INDICATIONS:  Melissa [name] is a 49 year old patient with left frozen shoulder, refractory to nonoperative management, presents now for operative management after explanation of risks and benefits.  OPERATIVE FINDINGS:  Examination under anesthesia.  Pre-manipulation range of motion, forward flexion about 140, external rotation at 15 degrees, abduction was about 40 degrees, isolated glenohumeral abduction was about 80.  Postmanipulation, forward flexion 180, external rotation at 15 degrees, abduction was about 50, isolated glenohumeral abduction 100 degrees.  PROCEDURE IN DETAIL:  The patient was brought to the operating room where general anesthetic was induced.  IV antibiotics were administered. Time-out was called.  Left shoulder manipulated into full forward flexion and abduction with some external rotation with care being taken to avoid rotational torque on the proximal humerus.  At this time, shoulder was prescrubbed with alcohol and Betadine, allowed to air dry, prepped with DuraPrep solution, draped in sterile manner.  Posterior portal created 2 cm medial and inferior to the posterolateral margin of the acromion.  Diagnostic arthroscopy was performed.  Anterior portal was then created under direct  visualization.  Rotator cuff was intact. Glenohumeral surfaces were intact.  The patient did have significant synovitis in and around the biceps anchor with a stable biceps anchor, but significant synovitis also within the rotator interval.  This was debrided with the ArthroCare wand.  Rotator interval was released longitudinally along the superior border of the intraarticular subscap out to the biceps tendon.  Following this rotator interval release, the scope was placed in the subacromial space.  Bursectomy was performed at that time.  The patient after rotator release had external rotation about 70 degrees.  At this time, thorough irrigation was performed of the joint in the subacromial space under direct visualization.  A solution of Marcaine, morphine, and Toradol was injected into the joint.  The portals were then closed using 3-0 nylon.  The patient was placed in a sling for one day only until the block wears off.  She will start on CPM machine today.     Burnard BuntingG. Scott Dean, M.D.     GSD/MEDQ  D:  11/20/2014  T:  11/20/2014  Job:  [id number]

## 2014-11-20 NOTE — Anesthesia Procedure Notes (Addendum)
Anesthesia Regional Block:  Interscalene brachial plexus block  Pre-Anesthetic Checklist: ,, timeout performed, Correct Patient, Correct Site, Correct Laterality, Correct Procedure, Correct Position, site marked, Risks and benefits discussed,  Surgical consent,  Pre-op evaluation,  At surgeon's request and post-op pain management  Laterality: Left and Upper  Prep: chloraprep       Needles:  Injection technique: Single-shot  Needle Type: Echogenic Needle     Needle Length: 5cm 5 cm Needle Gauge: 21 and 21 G    Additional Needles:  Procedures: ultrasound guided (picture in chart) Interscalene brachial plexus block Narrative:  Start time: 11/20/2014 7:03 AM End time: 11/20/2014 7:08 AM Injection made incrementally with aspirations every 5 mL.  Performed by: Personally  Anesthesiologist: CREWS, DAVID   Procedure Name: Intubation Date/Time: 11/20/2014 7:41 AM Performed by: Marylyn IshiharaFURLOW, Michell Giuliano Pre-anesthesia Checklist: Patient identified, Emergency Drugs available, Suction available, Patient being monitored and Timeout performed Patient Re-evaluated:Patient Re-evaluated prior to inductionOxygen Delivery Method: Circle system utilized Preoxygenation: Pre-oxygenation with 100% oxygen Intubation Type: IV induction Ventilation: Mask ventilation without difficulty Laryngoscope Size: Mac and 3 Grade View: Grade I Tube type: Oral Tube size: 7.0 mm Number of attempts: 1 Placement Confirmation: ETT inserted through vocal cords under direct vision,  positive ETCO2 and breath sounds checked- equal and bilateral Secured at: 22 cm Tube secured with: Tape

## 2014-11-20 NOTE — Addendum Note (Signed)
Addendum  created 11/20/14 1649 by Marylyn Ishiharaiane Montay Vanvoorhis, CRNA   Modules edited: Anesthesia Attestations, Anesthesia Events, Narrator   Narrator:  Narrator: Event Log Edited

## 2014-11-20 NOTE — H&P (Signed)
Melissa [name] is an 49 y.o. female.   Chief Complaint: Left shoulder pain and stiffness HPI: Melissa [name] is a 49 year old female with left shoulder pain and stiffness for over 6 months. Denies any history of injury or trauma. She has had multiple episodes of nonoperative treatment including anti-inflammatory disease injection home access program to try to improve stretching however she reports report persistent pain and stiffness in the left shoulder. Denies any neck symptoms or numbness and tingling in the arm. Presents now for manipulation under anesthesia  and rotator interval release.  Past Medical History  Diagnosis Date  . Complication of anesthesia   . PONV (postoperative nausea and vomiting)   . Diabetes mellitus without complication     Type II  . Dysrhythmia     Tachycardia  . Shortness of breath dyspnea     with exertion  . Pneumonia     2014- 'Walking"  . GERD (gastroesophageal reflux disease)     Past Surgical History  Procedure Laterality Date  . Tonsillectomy  2010  . Colonoscopy  2015  . Cholecystectomy  1997  . Cystoscopy      age 42- stretched stem of bladder  . Eye surgery      Baby- Strabismus    Family History  Problem Relation Age of Onset  . Adopted: Yes   Social History:  reports that she has never smoked. She does not have any smokeless tobacco history on file. She reports that she does not drink alcohol or use illicit drugs.  Allergies:  Allergies  Allergen Reactions  . Amoxicillin Anaphylaxis  . Ciprofloxacin Anaphylaxis  . Erythromycin Anaphylaxis  . Penicillins Anaphylaxis  . Sulfa Antibiotics Anaphylaxis  . Vicodin [Hydrocodone-Acetaminophen] Itching    Medications Prior to Admission  Medication Sig Dispense Refill  . insulin aspart (NOVOLOG) cartridge Inject into the skin 3 (three) times daily with meals. Sliding Scale    . Insulin Glargine 300 UNIT/ML SOPN Inject 50 Units into the skin every evening. Toujeo    . medroxyPROGESTERone  (DEPO-PROVERA) 150 MG/ML injection Inject 150 mg into the muscle every 3 (three) months.    . metFORMIN (GLUCOPHAGE) 1000 MG tablet Take 1,000 mg by mouth 2 (two) times daily with a meal.    . Naproxen Sodium 220 MG CAPS Take 440 mg by mouth daily as needed.    . nebivolol (BYSTOLIC) 10 MG tablet Take 10 mg by mouth daily.    . sitaGLIPtin (JANUVIA) 100 MG tablet Take 100 mg by mouth daily.    Marland Kitchen. telmisartan (MICARDIS) 40 MG tablet Take 40 mg by mouth daily.    Marland Kitchen. omeprazole (PRILOSEC OTC) 20 MG tablet Take 20 mg by mouth as needed.      Results for orders placed or performed during the hospital encounter of 11/20/14 (from the past 48 hour(s))  Glucose, capillary     Status: Abnormal   Collection Time: 11/20/14  5:49 AM  Result Value Ref Range   Glucose-Capillary 142 (H) 65 - 99 mg/dL   Comment 1 Notify RN    No results found.  Review of Systems  Constitutional: Negative.   HENT: Negative.   Eyes: Negative.   Respiratory: Negative.   Cardiovascular: Negative.   Gastrointestinal: Negative.   Genitourinary: Negative.   Musculoskeletal: Positive for joint pain.  Skin: Negative.   Neurological: Negative.   Endo/Heme/Allergies: Negative.   Psychiatric/Behavioral: Negative.     Blood pressure 116/47, pulse 91, temperature 98.2 F (36.8 C), resp. rate 20, height 5' (1.524  m), weight 119.296 kg (263 lb), SpO2 98 %. Physical Exam  Constitutional: She appears well-developed.  HENT:  Head: Normocephalic.  Eyes: Pupils are equal, round, and reactive to light.  Neck: Normal range of motion.  Cardiovascular: Normal rate.   Respiratory: Effort normal.  Neurological: She is alert.  Skin: Skin is warm.  Psychiatric: She has a normal mood and affect.   examination of the left shoulder demonstrates intact radial pulse 5 out of 5 grip EPL FPL interosseous flexion extension biceps triceps deltoid strength she does have restricted shoulder range of motion external rotation 15 is abduction is  about 20 on the left compared to 60 on the right she has a less than 90 for flexion abduction on the left-hand side rotator cuff strength is intact   Assessment/Plan Impression is left frozen shoulder with refractory to nonoperative management plan left shoulder manipulation under anesthesia with rotator interval release risk and benefits discussed with the patient including but not limited to infection nerve vessel damage potential for bone breakage as well as incomplete return of full range of motion plan to perform as outpatient with use of CPM machine to begin today all questions  answered  Melissa [name] SCOTT 11/20/2014, 7:21 AM

## 2014-11-20 NOTE — Brief Op Note (Signed)
11/20/2014  8:48 AM  PATIENT:  Melissa [name]  49 y.o. female  PRE-OPERATIVE DIAGNOSIS:  LEFT FROZEN SHOULDER  POST-OPERATIVE DIAGNOSIS:  LEFT FROZEN SHOULDER  PROCEDURE:  Procedure(s): ARTHROSCOPY SHOULDER WITH MUA, ROTATOR INTERVAL RELEASE  SURGEON:  Surgeon(s): Cammy CopaScott Saifullah Jolley, MD  ASSISTANT: carla bethune rnfa  ANESTHESIA:   general  EBL: 2 ml    Total I/O In: 1001 [I.V.:1001] Out: -   BLOOD ADMINISTERED: none  DRAINS: none   LOCAL MEDICATIONS USED:  none  SPECIMEN:  No Specimen  COUNTS:  YES  TOURNIQUET:  * No tourniquets in log *  DICTATION: .Other Dictation: Dictation Number [id number]  PLAN OF CARE: Discharge to home after PACU  PATIENT DISPOSITION:  PACU - hemodynamically stable

## 2014-11-21 ENCOUNTER — Encounter (HOSPITAL_COMMUNITY): Payer: Self-pay | Admitting: Orthopedic Surgery

## 2014-11-28 ENCOUNTER — Ambulatory Visit: Attending: Orthopedic Surgery | Admitting: Physical Therapy

## 2014-11-28 DIAGNOSIS — M25512 Pain in left shoulder: Secondary | ICD-10-CM | POA: Diagnosis not present

## 2014-11-28 DIAGNOSIS — M25612 Stiffness of left shoulder, not elsewhere classified: Secondary | ICD-10-CM

## 2014-11-28 DIAGNOSIS — R293 Abnormal posture: Secondary | ICD-10-CM | POA: Diagnosis not present

## 2014-11-28 DIAGNOSIS — R29898 Other symptoms and signs involving the musculoskeletal system: Secondary | ICD-10-CM | POA: Diagnosis not present

## 2014-11-28 NOTE — Patient Instructions (Signed)
   Rayshawn Maney PT, DPT, LAT, ATC  [facility] Phone: [phone]     

## 2014-11-28 NOTE — Therapy (Signed)
[facility] [address] [place], [place], [address] Phone: [phone]   Fax:  [phone]  Physical Therapy Evaluation  Patient Details  Name: ANNABEL ROCKMAN MRN: [id number] Date of Birth: 1965-12-14 Referring Provider:  Cammy Copa, MD  Encounter Date: 11/28/2014      PT End of Session - 11/28/14 1621    Visit Number 1   Number of Visits 12   Date for PT Re-Evaluation 01/09/15   PT Start Time 1545   PT Stop Time 1630   PT Time Calculation (min) 45 min   Activity Tolerance Patient tolerated treatment well;Patient limited by pain   Behavior During Therapy [facility] for tasks assessed/performed      Past Medical History  Diagnosis Date  . Complication of anesthesia   . PONV (postoperative nausea and vomiting)   . Diabetes mellitus without complication     Type II  . Dysrhythmia     Tachycardia  . Shortness of breath dyspnea     with exertion  . Pneumonia     2014- 'Walking"  . GERD (gastroesophageal reflux disease)     Past Surgical History  Procedure Laterality Date  . Tonsillectomy  2010  . Colonoscopy  2015  . Cholecystectomy  1997  . Cystoscopy      age 29- stretched stem of bladder  . 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 Shoulder arthroscopy Left 11/20/2014    Procedure: ARTHROSCOPY SHOULDER WITH MUA, ROTATOR INTERVAL RELEASE;  Surgeon: Cammy Copa, MD;  [facility]: [facility];  Service: Orthopedics;  Laterality: Left;  LEFT SHOULDER MUA, DOA, ROTATOR INTERVAL RELEASE.    There were no vitals filed for this visit.  Visit Diagnosis:  Left shoulder pain - Plan: PT plan of care cert/re-cert  Decreased ROM of left shoulder - Plan: PT plan of care cert/re-cert  Weakness of left arm - Plan: PT plan of care cert/re-cert  Posture abnormality - Plan: PT plan of care cert/re-cert      Subjective Assessment - 11/28/14 1539    Subjective pt a 49 y.o F with CC of L should pain s/p L shoulder atrhroscopy and MUA  11/20/2014. She reports currently she is feeling like she is doing ok.    Limitations Lifting;House hold activities   How long can you sit comfortably? unlimited   How long can you stand comfortably? unlimited   How long can you walk comfortably? unlimited   Diagnostic tests MRI 03/09/2015 Minimal tendinosis of the supraspinatus tendon without a discrete   Patient Stated Goals to be able to reach, and improve range and strength.    Currently in Pain? Yes   Pain Score 2   last took pain medication 4am   Pain Location Shoulder   Pain Orientation Left   Pain Descriptors / Indicators Dull  deep   Pain Type Surgical pain   Pain Onset In the past 7 days   Pain Frequency Constant   Aggravating Factors  lifting and reaching, laying down   Pain Relieving Factors pain medication            [facility] PT Assessment - 11/28/14 1543    Assessment   Medical Diagnosis L shoulder pain   s/p left shoulder arthroscopy and MUA   Onset Date/Surgical Date --  11/20/2014   Hand Dominance Right   Next MD Visit 12/05/2014   Prior Therapy yes   Precautions   Precautions None   Restrictions   Weight  Bearing Restrictions No   Balance Screen   Has the patient fallen in the past 6 months No   Has the patient had a decrease in activity level because of a fear of falling?  No   Is the patient reluctant to leave their home because of a fear of falling?  No   Home Environment   Living Environment Private residence   Living Arrangements Spouse/significant other   Available Help at Discharge Available 24 hours/day;Available PRN/intermittently   Type of Home House   Home Access Stairs to enter   Entrance Stairs-Number of Steps 4   Home Layout One level   Prior Function   Level of Independence Independent;Independent with basic ADLs   Vocation Full time employment  Nurse   Vocation Requirements lifting, pulling, carrying, standing, walking   Leisure going home, playing with puppy   Cognition   Overall  Cognitive Status Within Functional Limits for tasks assessed   Observation/Other Assessments   Observations surgical incisions is clean, intact and healing well   Focus on Therapeutic Outcomes (FOTO)  41% limited  Predicted 28%   Posture/Postural Control   Posture/Postural Control Postural limitations   Postural Limitations Rounded Shoulders;Forward head   ROM / Strength   AROM / PROM / Strength PROM;AROM;Strength   AROM   Overall AROM Comments R with normal limits   AROM Assessment Site Shoulder   Right/Left Shoulder Right;Left   Left Shoulder Extension 25 Degrees  pain at end range   Left Shoulder Flexion 118 Degrees  pain at end range   Left Shoulder ABduction 80 Degrees  pain at end range   Left Shoulder Internal Rotation 20 Degrees  pain at end range   Left Shoulder External Rotation 15 Degrees  pain at endrange   PROM   PROM Assessment Site Shoulder   Right/Left Shoulder Left;Right   Left Shoulder Extension 40 Degrees   Left Shoulder Flexion 125 Degrees  pain at end range   Left Shoulder ABduction 98 Degrees   Left Shoulder Internal Rotation 25 Degrees   Left Shoulder External Rotation 22 Degrees   Strength   Strength Assessment Site Shoulder   Right/Left Shoulder Right;Left   Right Shoulder Flexion 5/5   Right Shoulder Extension 5/5   Right Shoulder ABduction 5/5   Right Shoulder Internal Rotation 5/5   Right Shoulder External Rotation 5/5   Left Shoulder Flexion 3/5   Left Shoulder Extension 3/5   Left Shoulder ABduction 3/5   Left Shoulder Internal Rotation 3/5   Left Shoulder External Rotation 3/5                   [facility] Adult PT Treatment/Exercise - 11/28/14 1543    Shoulder Exercises: Supine   Protraction AROM;Strengthening;10 reps;Limitations  no weight   Shoulder Exercises: Standing   External Rotation AROM;Strengthening;Left;10 reps;Theraband   Theraband Level (Shoulder External Rotation) Level 2 (Red)   Internal Rotation  AROM;Strengthening;Right;10 reps;Theraband   Theraband Level (Shoulder Internal Rotation) Level 2 (Red)   Row AROM;Strengthening;Both;10 reps;Theraband   Theraband Level (Shoulder Row) Level 2 (Red)   Shoulder Exercises: ROM/Strengthening   Other ROM/Strengthening Exercises wand flex/ abd x 10  in standing   Other ROM/Strengthening Exercises External rotation wand x 10  in supine                PT Education - 11/28/14 1620    Education provided Yes   Education Details evaluation findings, POC, goals, HEP   Person(s) Educated Patient  Methods Explanation   Comprehension Verbalized understanding          PT Short Term Goals - 11/28/14 1628    PT SHORT TERM GOAL #1   Title pt will be I with basic HEP (12/19/2014)   Time 3   Period Weeks   Status New   PT SHORT TERM GOAL #2   Title She will be able to verbalize and demonstrate techniques to reduce inflammation via RICE method(12/19/2014)   Time 3   Status New   PT SHORT TERM GOAL #3   Title She will increase her FOTO score by > 10 points to assist with increased functional capacity (12/19/2014)   Time 3   Period Weeks   Status New           PT Long Term Goals - 11/28/14 1629    PT LONG TERM GOAL #1   Title pt will be I upon discharge with all exercises given throught therapy (01/09/2015)   Time 6   Period Weeks   Status New   PT LONG TERM GOAL #2   Title She will Increase L shoulder AROM to [facility] compared bil to assist with ADLS and personal hygiene (01/09/2015)   Time 6   Period Weeks   Status New   PT LONG TERM GOAL #3   Title She will demonstrate < 2/10 pain during and following overhead lifting and pushing and pulling > 5# to assist with work related task (01/09/2015)   Time 6   Period Weeks   Status New   PT LONG TERM GOAL #4   Title She will increase L shoulder strength to <4/5 to assist with ADLs and work related activities (01/09/2015)   Time 6   Period Weeks   Status New   PT LONG TERM GOAL #5    Title She will increase her FOTO score to > 72 upon discharge to indicate improved functional capacity (01/09/2015)   Time 6   Period Weeks   Status New               Plan - 11/28/14 1621    Clinical Impression Statement Laycee present to OPPT with CC of L shoulder pain s/p shoulder arthroscopy and MUA. The incision site is clean and intact and appears to be healing well. She demonstrates limited AROM/PROM of the left shoulder and limited strength secondary to pain and stiffness. She demonstrates tenderness located at the belly of the supraspinatus, the lateral deltoid and anterior shoulder. She would benefit from skilled physical therapy to improve her function and decrease pain by addressing the impairments listed.    Pt will benefit from skilled therapeutic intervention in order to improve on the following deficits Pain;Impaired UE functional use;Decreased balance;Decreased strength;Postural dysfunction;Improper body mechanics;Hypomobility;Decreased activity tolerance;Decreased endurance;Decreased range of motion;Increased muscle spasms   Rehab Potential Good   PT Frequency 2x / week   PT Duration 6 weeks   PT Treatment/Interventions ADLs/Self Care Home Management;Iontophoresis /ml Dexamethasone;Cryotherapy;Electrical Stimulation;Ultrasound;Moist Heat;Therapeutic activities;Therapeutic exercise;Neuromuscular re-education;Manual techniques;Taping;Dry needling;Patient/family education   PT Next Visit Plan assess response to HEP, shoulder mobility. scapular stability, modalities PRN   PT Home Exercise Plan SEE HEP handout   Consulted and Agree with Plan of Care Patient         Problem List There are no active problems to display for this patient.  Lulu Riding PT, DPT, LAT, ATC  11/28/2014  4:41 PM   [facility] [address] [place], [place], [address]  Phone: [phone]   Fax:  [phone]

## 2014-12-03 ENCOUNTER — Encounter: Payer: Self-pay | Admitting: Physical Therapy

## 2014-12-03 ENCOUNTER — Ambulatory Visit: Attending: Orthopedic Surgery | Admitting: Physical Therapy

## 2014-12-03 DIAGNOSIS — R29898 Other symptoms and signs involving the musculoskeletal system: Secondary | ICD-10-CM | POA: Diagnosis not present

## 2014-12-03 DIAGNOSIS — M25512 Pain in left shoulder: Secondary | ICD-10-CM | POA: Diagnosis present

## 2014-12-03 DIAGNOSIS — R293 Abnormal posture: Secondary | ICD-10-CM | POA: Insufficient documentation

## 2014-12-03 DIAGNOSIS — M25612 Stiffness of left shoulder, not elsewhere classified: Secondary | ICD-10-CM

## 2014-12-03 NOTE — Patient Instructions (Signed)
  Scapular Retraction: Abduction / Extension (Prone)   Lie with arms out from sides 90. Pinch shoulder blades together and raise arms a few inches from floor. Repeat _10___ times per set. Do _1___ sets per session. Do __1__ sessions per day. Then bring arms back at side 10 times squeezing shoulder blades together.   CHEST: Doorway, Bilateral - Standing   Standing in doorway, place hands on wall with elbows bent at shoulder height. Lean forward. Hold 15___ seconds. 2__ reps per set, _1__ sets per day, _2__ days per week  Copyright  VHI. All rights reserved.   http://orth.exer.us/959   Copyright  VHI. All rights reserved.

## 2014-12-03 NOTE — Therapy (Signed)
[facility] [address], [place], [place], [address] Phone: [phone]   Fax:  [phone]  Physical Therapy Treatment  Patient Details  Name: Melissa [name] MRN: [id number] Date of Birth: 09-12-1965 Referring Provider:  Meredith Pel, MD  Encounter Date: 12/03/2014      PT End of Session - 12/03/14 0841    Visit Number 2   Number of Visits 12   Date for PT Re-Evaluation 01/09/15   PT Start Time 0800   PT Stop Time 0842   PT Time Calculation (min) 42 min   Activity Tolerance Patient tolerated treatment well;Patient limited by pain   Behavior During Therapy [facility] for tasks assessed/performed      Past Medical History  Diagnosis Date  . Complication of anesthesia   . PONV (postoperative nausea and vomiting)   . Diabetes mellitus without complication     Type II  . Dysrhythmia     Tachycardia  . Shortness of breath dyspnea     with exertion  . Pneumonia     2014- 'Walking"  . GERD (gastroesophageal reflux disease)     Past Surgical History  Procedure Laterality Date  . Tonsillectomy  2010  . Colonoscopy  2015  . Cholecystectomy  1997  . Cystoscopy      age 80- stretched stem of bladder  . 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 Shoulder arthroscopy Left 11/20/2014    Procedure: ARTHROSCOPY SHOULDER WITH MUA, ROTATOR INTERVAL RELEASE;  Surgeon: Meredith Pel, MD;  Location: [place];  Service: Orthopedics;  Laterality: Left;  LEFT SHOULDER MUA, DOA, ROTATOR INTERVAL RELEASE.    There were no vitals filed for this visit.  Visit Diagnosis:  Left shoulder pain  Decreased ROM of left shoulder  Weakness of left arm  Posture abnormality      Subjective Assessment - 12/03/14 0804    Subjective I am in a little bit of pain.  I am out of work and will go back in a couple of days.  I have a continous motion machine at home that I have been using.    Limitations Lifting;House hold activities   Diagnostic tests MRI 03/09/2015 Minimal tendinosis of the supraspinatus tendon without a discrete   Patient Stated Goals to be able to reach, and improve range and strength.    Currently in Pain? Yes   Pain Score 3    Pain Location Shoulder   Pain Orientation Left   Pain Descriptors / Indicators Aching;Dull   Pain Type Surgical pain   Pain Onset In the past 7 days   Pain Frequency Intermittent   Aggravating Factors  lifting and reaching, and laying down   Pain Relieving Factors pain medication   Multiple Pain Sites No                         [facility] Adult PT Treatment/Exercise - 12/03/14 0001    Shoulder Exercises: Pulleys   Flexion 2 minutes   ABduction 2 minutes   Shoulder Exercises: ROM/Strengthening   UBE (Upper Arm Bike) level 1, 2 min forward, 2 min backward   Other ROM/Strengthening Exercises wall ladder for flexion and abduction 10x with Tactlie cues to decrease upper trap activity   Manual Therapy   Manual Therapy Soft tissue mobilization;Taping;Joint mobilization   Joint Mobilization P-A mobilization to T1-T5 in prone   Soft tissue mobilization left upper trap, subscapularis, levator scapua, RTC  Kinesiotex Edema;Inhibit Muscle  inhibit the upper left trap, edema over T1-T5                PT Education - 12/03/14 0840    Education provided Yes   Education Details shoulder extension, shoulder horizontal abduction, doorway stretch   Person(s) Educated Patient   Methods Explanation;Demonstration;Tactile cues;Verbal cues;Handout   Comprehension Returned demonstration;Verbalized understanding          PT Short Term Goals - 12/03/14 6659    PT SHORT TERM GOAL #1   Title pt will be I with basic HEP (12/19/2014)   Time 3   Period Weeks   Status On-going  learning exercises   PT SHORT TERM GOAL #2   Title She will be able to verbalize and demonstrate techniques to reduce inflammation via RICE method(12/19/2014)   Time 3   Period Weeks   Status  Achieved   PT SHORT TERM GOAL #3   Title She will increase her FOTO score by > 10 points to assist with increased functional capacity (12/19/2014)   Time 3   Period Weeks   Status On-going  improving daily           PT Long Term Goals - 11/28/14 1629    PT LONG TERM GOAL #1   Title pt will be I upon discharge with all exercises given throught therapy (01/09/2015)   Time 6   Period Weeks   Status New   PT LONG TERM GOAL #2   Title She will Increase L shoulder AROM to [facility] compared bil to assist with ADLS and personal hygiene (01/09/2015)   Time 6   Period Weeks   Status New   PT LONG TERM GOAL #3   Title She will demonstrate < 2/10 pain during and following overhead lifting and pushing and pulling > 5# to assist with work related task (01/09/2015)   Time 6   Period Weeks   Status New   PT LONG TERM GOAL #4   Title She will increase L shoulder strength to <4/5 to assist with ADLs and work related activities (01/09/2015)   Time 6   Period Weeks   Status New   PT LONG TERM GOAL #5   Title She will increase her FOTO score to > 72 upon discharge to indicate improved functional capacity (01/09/2015)   Time [place] - 12/03/14 9357    Clinical Impression Statement Patient has increased swelling in the cervical thoracic area with decreased vertebral mobility.  Patient left upper trapezius is hyperactive with movement and has difficulty with shutting it down.  Patietn has tight shoulder girdle muscles.  Patient has weakness in scapular muscles. RICE goal met.    Pt will benefit from skilled therapeutic intervention in order to improve on the following deficits Pain;Impaired UE functional use;Decreased balance;Decreased strength;Postural dysfunction;Improper body mechanics;Hypomobility;Decreased activity tolerance;Decreased endurance;Decreased range of motion;Increased muscle spasms   Rehab Potential Good   PT Frequency 2x / week   PT  Duration 6 weeks   PT Treatment/Interventions ADLs/Self Care Home Management;Iontophoresis 70m/ml Dexamethasone;Cryotherapy;Electrical Stimulation;Ultrasound;Moist Heat;Therapeutic activities;Therapeutic exercise;Neuromuscular re-education;Manual techniques;Taping;Dry needling;Patient/family education   PT Next Visit Plan scapular strengthening, see how tape helped, work on inhibiting left upper trap, left shoulder strengthening; take shoulder ROM measurements   PT Home Exercise Plan current HEP   Consulted and Agree with Plan of Care Patient  Problem List There are no active problems to display for this patient.   Shakenna Herrero,PT 12/03/2014, 8:47 AM  [facility] [address], [place], [place], [address] Phone: [phone]   Fax:  [phone]

## 2014-12-04 ENCOUNTER — Ambulatory Visit

## 2014-12-04 DIAGNOSIS — R293 Abnormal posture: Secondary | ICD-10-CM

## 2014-12-04 DIAGNOSIS — R29898 Other symptoms and signs involving the musculoskeletal system: Secondary | ICD-10-CM

## 2014-12-04 DIAGNOSIS — M25612 Stiffness of left shoulder, not elsewhere classified: Secondary | ICD-10-CM

## 2014-12-04 DIAGNOSIS — M25512 Pain in left shoulder: Secondary | ICD-10-CM

## 2014-12-04 NOTE — Therapy (Signed)
[facility] [address], [address] [place], [place], [address] Phone: [phone]   Fax:  [phone]  Physical Therapy Treatment  Patient Details  Name: FORRESTINE LECRONE MRN: [id number] Date of Birth: Dec 21, 1965 Referring Provider:  Cammy Copa, MD  Encounter Date: 12/04/2014      PT End of Session - 12/04/14 1137    Visit Number 3   Date for PT Re-Evaluation 01/09/15   PT Start Time 1054   PT Stop Time 1137   PT Time Calculation (min) 43 min   Activity Tolerance Patient tolerated treatment well   Behavior During Therapy [facility] for tasks assessed/performed      Past Medical History  Diagnosis Date  . Complication of anesthesia   . PONV (postoperative nausea and vomiting)   . Diabetes mellitus without complication     Type II  . Dysrhythmia     Tachycardia  . Shortness of breath dyspnea     with exertion  . Pneumonia     2014- 'Walking"  . GERD (gastroesophageal reflux disease)     Past Surgical History  Procedure Laterality Date  . Tonsillectomy  2010  . Colonoscopy  2015  . Cholecystectomy  1997  . Cystoscopy      age 49- stretched stem of bladder  . 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 Shoulder arthroscopy Left 11/20/2014    Procedure: ARTHROSCOPY SHOULDER WITH MUA, ROTATOR INTERVAL RELEASE;  Surgeon: Cammy Copa, MD;  [facility];  Service: Orthopedics;  Laterality: Left;  LEFT SHOULDER MUA, DOA, ROTATOR INTERVAL RELEASE.    There were no vitals filed for this visit.  Visit Diagnosis:  Left shoulder pain  Decreased ROM of left shoulder  Weakness of left arm  Posture abnormality      Subjective Assessment - 12/04/14 1055    Subjective A little sore after treatment yesterday. Kinesiotape helped to reduce Lt UT pain.    Currently in Pain? Yes   Pain Score 0-No pain            [facility] PT Assessment - 12/04/14 0001    AROM   Left Shoulder Flexion 135 Degrees   Left Shoulder  ABduction 114 Degrees                     [facility] Adult PT Treatment/Exercise - 12/04/14 0001    Exercises   Exercises Neck   Neck Exercises: Seated   Other Seated Exercise UT stretch & levator stretch: 3x20 seconds   Shoulder Exercises: Seated   Horizontal ABduction Strengthening;Both;Theraband;20 reps   Theraband Level (Shoulder Horizontal ABduction) Level 1 (Yellow)   Shoulder Exercises: Standing   External Rotation AROM;Strengthening;Left;10 reps;Theraband   Theraband Level (Shoulder External Rotation) Level 2 (Red)  good demo of HEP   Internal Rotation AROM;Strengthening;Right;10 reps;Theraband   Theraband Level (Shoulder Internal Rotation) Level 2 (Red)   Row AROM;Strengthening;Both;10 reps;Theraband   Theraband Level (Shoulder Row) Level 2 (Red)   Other Standing Exercises finger ladder: flexion and abduction 2x10 each   Shoulder Exercises: Pulleys   Flexion 3 minutes   ABduction 2 minutes   Shoulder Exercises: ROM/Strengthening   UBE (Upper Arm Bike) level 1x 6 minutes (3/3)  Pt tolerated increased time well   Other ROM/Strengthening Exercises IR with towel 10" x 10                PT Education - 12/04/14 1137    Education provided Yes  Education Details IR with towel   Person(s) Educated Patient   Methods Explanation;Demonstration;Handout   Comprehension Verbalized understanding;Returned demonstration          PT Short Term Goals - 12/03/14 1771    PT SHORT TERM GOAL #1   Title pt will be I with basic HEP (12/19/2014)   Time 3   Period Weeks   Status On-going  learning exercises   PT SHORT TERM GOAL #2   Title She will be able to verbalize and demonstrate techniques to reduce inflammation via RICE method(12/19/2014)   Time 3   Period Weeks   Status Achieved   PT SHORT TERM GOAL #3   Title She will increase her FOTO score by > 10 points to assist with increased functional capacity (12/19/2014)   Time 3   Period Weeks   Status On-going   improving daily           PT Long Term Goals - 11/28/14 1629    PT LONG TERM GOAL #1   Title pt will be I upon discharge with all exercises given throught therapy (01/09/2015)   Time 6   Period Weeks   Status New   PT LONG TERM GOAL #2   Title She will Increase L shoulder AROM to [facility] compared bil to assist with ADLS and personal hygiene (01/09/2015)   Time 6   Period Weeks   Status New   PT LONG TERM GOAL #3   Title She will demonstrate < 2/10 pain during and following overhead lifting and pushing and pulling > 5# to assist with work related task (01/09/2015)   Time 6   Period Weeks   Status New   PT LONG TERM GOAL #4   Title She will increase L shoulder strength to <4/5 to assist with ADLs and work related activities (01/09/2015)   Time 6   Period Weeks   Status New   PT LONG TERM GOAL #5   Title She will increase her FOTO score to > 72 upon discharge to indicate improved functional capacity (01/09/2015)   Time 6   Period Weeks   Status New               Plan - 12/04/14 1112    Clinical Impression Statement Pt with good flexiblity of the Lt UE overhead.  Pt able to tolerate endurance exercise with short rest breaks today.  No pain reported with exercise.  Pt will benefit from PT for postural/scapular strength and retraining to inhibit scapular elevation with Lt UE use and to build Lt shoulder strength and endurance.     Pt will benefit from skilled therapeutic intervention in order to improve on the following deficits Pain;Impaired UE functional use;Decreased balance;Decreased strength;Postural dysfunction;Improper body mechanics;Hypomobility;Decreased activity tolerance;Decreased endurance;Decreased range of motion;Increased muscle spasms   Rehab Potential Good   PT Frequency 2x / week   PT Duration 6 weeks   PT Treatment/Interventions ADLs/Self Care Home Management;Iontophoresis 4mg /ml Dexamethasone;Cryotherapy;Electrical Stimulation;Ultrasound;Moist Heat;Therapeutic  activities;Therapeutic exercise;Neuromuscular re-education;Manual techniques;Taping;Dry needling;Patient/family education   PT Next Visit Plan Scapular, Lt shoulder strength, Postural srength.  Apply kinesiotape to Lt UE if needed.     Consulted and Agree with Plan of Care Patient        Problem List There are no active problems to display for this patient.   Briar Sword, PT 12/04/2014, 11:38 AM  [facility] [address], [address] [place], [place], [address] Phone: [phone]   Fax:  [phone]

## 2014-12-04 NOTE — Patient Instructions (Addendum)
ROM: Towel Stretch - with Interior Rotation   Pull left arm up behind back by pulling towel up with other arm. Hold _10___ seconds. Repeat _10___ times per set. Do ____ sets per session. Do 2-3___ sessions per day.  http://orth.exer.us/889   Copyright  VHI. All rights reserved.  [facility] [address] [place], [place] [address] Phone # [phone] Fax [phone]

## 2014-12-11 ENCOUNTER — Encounter: Admitting: Physical Therapy

## 2014-12-12 ENCOUNTER — Ambulatory Visit: Admitting: Physical Therapy

## 2014-12-12 ENCOUNTER — Encounter: Payer: Self-pay | Admitting: Physical Therapy

## 2014-12-12 DIAGNOSIS — M25512 Pain in left shoulder: Secondary | ICD-10-CM

## 2014-12-12 DIAGNOSIS — M25612 Stiffness of left shoulder, not elsewhere classified: Secondary | ICD-10-CM

## 2014-12-12 DIAGNOSIS — R29898 Other symptoms and signs involving the musculoskeletal system: Secondary | ICD-10-CM

## 2014-12-12 DIAGNOSIS — R293 Abnormal posture: Secondary | ICD-10-CM

## 2014-12-12 NOTE — Therapy (Signed)
[facility] [address] [place], [place], [address] Phone: [phone]   Fax:  [phone]  Physical Therapy Treatment  Patient Details  Name: Melissa [name] MRN: [id number] Date of Birth: November 15, 1965 Referring Provider:  Cammy Copa, MD  Encounter Date: 12/12/2014      PT End of Session - 12/12/14 1046    Visit Number 4   Number of Visits 12   Date for PT Re-Evaluation 01/09/15   PT Start Time 1015   PT Stop Time 1053   PT Time Calculation (min) 38 min   Activity Tolerance Patient tolerated treatment well   Behavior During Therapy [facility] for tasks assessed/performed      Past Medical History  Diagnosis Date  . Complication of anesthesia   . PONV (postoperative nausea and vomiting)   . Diabetes mellitus without complication     Type II  . Dysrhythmia     Tachycardia  . Shortness of breath dyspnea     with exertion  . Pneumonia     2014- 'Walking"  . GERD (gastroesophageal reflux disease)     Past Surgical History  Procedure Laterality Date  . Tonsillectomy  2010  . Colonoscopy  2015  . Cholecystectomy  1997  . Cystoscopy      age 9- stretched stem of bladder  . 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 Shoulder arthroscopy Left 11/20/2014    Procedure: ARTHROSCOPY SHOULDER WITH MUA, ROTATOR INTERVAL RELEASE;  Surgeon: Cammy Copa, MD;  Location: [facility] OR;  Service: Orthopedics;  Laterality: Left;  LEFT SHOULDER MUA, DOA, ROTATOR INTERVAL RELEASE.    There were no vitals filed for this visit.  Visit Diagnosis:  Weakness of left arm  Left shoulder pain  Decreased ROM of left shoulder  Posture abnormality      Subjective Assessment - 12/12/14 1013    Subjective Went back to work this week.Work was super busy, pulled three people out of cars.   Currently in Pain? Yes   Pain Score 2    Pain Orientation Left   Pain Descriptors / Indicators Sore   Aggravating Factors  Work duites, heavy  duties   Pain Relieving Factors Meds                         [facility] Adult PT Treatment/Exercise - 12/12/14 0001    Neck Exercises: Seated   Other Seated Exercise UT 3 x breaths, holding onto bed    Other Seated Exercise Levator stretch RT 3x breaths   Shoulder Exercises: Seated   Horizontal ABduction Strengthening;Both;20 reps;Theraband  TC to inhibit Bil UT 2x10   Theraband Level (Shoulder Horizontal ABduction) Level 1 (Yellow)   External Rotation Strengthening;Both;20 reps;Theraband   Theraband Level (Shoulder External Rotation) Level 1 (Yellow)   External Rotation Limitations VC to open chest   Shoulder Exercises: Standing   Extension Strengthening;Both;20 reps;Theraband   Theraband Level (Shoulder Extension) Level 2 (Red)   Row Strengthening;Both;20 reps;Theraband   Theraband Level (Shoulder Row) Level 2 (Red)   Shoulder Exercises: ROM/Strengthening   UBE (Upper Arm Bike) L2 3x3    Pushups 10 reps  VC/TC to inhibit bil UT   Shoulder Exercises: Stretch   Other Shoulder Stretches IR behind the back stretch with towel 3x 15 sec   Manual Therapy   Manual Therapy --  Applied Kineisotape to LT upper trap, skin intact   Manual therapy comments Pt verbally  understands wear time                   PT Short Term Goals - 12/12/14 1039    PT SHORT TERM GOAL #1   Title pt will be I with basic HEP (12/19/2014)   Period Weeks   Status Achieved   PT SHORT TERM GOAL #2   Title She will be able to verbalize and demonstrate techniques to reduce inflammation via RICE method(12/19/2014)   Time 3   Period Weeks   Status Achieved   PT SHORT TERM GOAL #3   Title She will increase her FOTO score by > 10 points to assist with increased functional capacity (12/19/2014)   Time 3   Period Weeks   Status On-going  Will do on 10th visit.            PT Long Term Goals - 11/28/14 1629    PT LONG TERM GOAL #1   Title pt will be I upon discharge with all exercises given  throught therapy (01/09/2015)   Time 6   Period Weeks   Status New   PT LONG TERM GOAL #2   Title She will Increase L shoulder [facility]OM to [facility] bil to assist with ADLS and personal hygiene (01/09/2015)   Time 6   Period Weeks   Status New   PT LONG TERM GOAL #3   Title She will demonstrate < 2/10 pain during and following overhead lifting and pushing and pulling > 5# to assist with work related task (01/09/2015)   Time 6   Period Weeks   Status New   PT LONG TERM GOAL #4   Title She will increase L shoulder strength to <4/5 to assist with ADLs and work related activities (01/09/2015)   Time 6   Period Weeks   Status New   PT LONG TERM GOAL #5   Title She will increase her FOTO score to > 72 upon discharge to indicate improved functional capacity (01/09/2015)   Time 6   Period Weeks   Status New               Plan - 12/12/14 1047    Clinical Impression Statement Started work this week and has been sore. Was able to do everything at work she needed to do. Continues to work on inhibiting her UT muscles as she strengthens her upper/mid back.   Pt will benefit from skilled therapeutic intervention in order to improve on the following deficits Pain;Impaired UE functional use;Decreased balance;Decreased strength;Postural dysfunction;Improper body mechanics;Hypomobility;Decreased activity tolerance;Decreased endurance;Decreased range of motion;Increased muscle spasms   Rehab Potential Good   PT Duration 6 weeks   PT Treatment/Interventions ADLs/Self Care Home Management;Iontophoresis 4mg /ml Dexamethasone;Cryotherapy;Electrical Stimulation;Ultrasound;Moist Heat;Therapeutic activities;Therapeutic exercise;Neuromuscular re-education;Manual techniques;Taping;Dry needling;Patient/family education   PT Next Visit Plan Scapular, Lt shoulder strength, Postural srength.  Apply kinesiotape to Lt UE if needed.     Consulted and Agree with Plan of Care Patient        Problem List There are  no active problems to display for this patient.   COCHRAN,JENNIFER, PTA 12/12/2014, 10:54 AM  [facility] [address] [place], [place], [address] Phone: [phone]   Fax:  [phone]

## 2014-12-18 ENCOUNTER — Encounter: Payer: Self-pay | Admitting: Physical Therapy

## 2014-12-18 ENCOUNTER — Ambulatory Visit: Attending: Orthopedic Surgery | Admitting: Physical Therapy

## 2014-12-18 DIAGNOSIS — M25512 Pain in left shoulder: Secondary | ICD-10-CM | POA: Insufficient documentation

## 2014-12-18 DIAGNOSIS — M25612 Stiffness of left shoulder, not elsewhere classified: Secondary | ICD-10-CM

## 2014-12-18 DIAGNOSIS — M7582 Other shoulder lesions, left shoulder: Secondary | ICD-10-CM | POA: Diagnosis present

## 2014-12-18 DIAGNOSIS — R29898 Other symptoms and signs involving the musculoskeletal system: Secondary | ICD-10-CM | POA: Diagnosis present

## 2014-12-18 DIAGNOSIS — R293 Abnormal posture: Secondary | ICD-10-CM | POA: Diagnosis present

## 2014-12-18 NOTE — Therapy (Signed)
[facility] [address] [place], [place], [address] Phone: [phone]   Fax:  [phone]  Physical Therapy Treatment  Patient Details  Name: Melissa [name] MRN: [id number] Date of Birth: 1965/10/26 Referring Provider:  Cammy Copa, MD  Encounter Date: 12/18/2014      PT End of Session - 12/18/14 1611    Visit Number 5   Number of Visits 12   Date for PT Re-Evaluation 01/09/15   PT Start Time 1530   PT Stop Time 1612   PT Time Calculation (min) 42 min   Activity Tolerance Patient tolerated treatment well   Behavior During Therapy [facility] for tasks assessed/performed      Past Medical History  Diagnosis Date  . Complication of anesthesia   . PONV (postoperative nausea and vomiting)   . Diabetes mellitus without complication     Type II  . Dysrhythmia     Tachycardia  . Shortness of breath dyspnea     with exertion  . Pneumonia     2014- 'Walking"  . GERD (gastroesophageal reflux disease)     Past Surgical History  Procedure Laterality Date  . Tonsillectomy  2010  . Colonoscopy  2015  . Cholecystectomy  1997  . Cystoscopy      age 21- stretched stem of bladder  . 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 Shoulder arthroscopy Left 11/20/2014    Procedure: ARTHROSCOPY SHOULDER WITH MUA, ROTATOR INTERVAL RELEASE;  Surgeon: Cammy Copa, MD;  Location: [facility] OR;  Service: Orthopedics;  Laterality: Left;  LEFT SHOULDER MUA, DOA, ROTATOR INTERVAL RELEASE.    There were no vitals filed for this visit.  Visit Diagnosis:  Weakness of left arm  Left shoulder pain  Decreased ROM of left shoulder      Subjective Assessment - 12/18/14 1539    Subjective I have been sore lately.  When cold air hits the area I will hurt. I could reach in my back pocket last week for first time.    Limitations Lifting;House hold activities   How long can you sit comfortably? unlimited   How long can you stand comfortably?  unlimited   How long can you walk comfortably? unlimited   Diagnostic tests MRI 03/09/2015 Minimal tendinosis of the supraspinatus tendon without a discrete   Patient Stated Goals to be able to reach, and improve range and strength.    Currently in Pain? Yes   Pain Score 3    Pain Location Shoulder   Pain Orientation Left   Pain Descriptors / Indicators Dull;Aching   Pain Type Surgical pain   Pain Onset In the past 7 days   Pain Frequency Intermittent   Aggravating Factors  cold air   Pain Relieving Factors ice   Multiple Pain Sites No            [facility] PT Assessment - 12/18/14 0001    AROM   Left Shoulder Flexion 150 Degrees   Left Shoulder ABduction 142 Degrees   Left Shoulder Internal Rotation 58 Degrees   Left Shoulder External Rotation 72 Degrees                     [facility] Adult PT Treatment/Exercise - 12/18/14 0001    Shoulder Exercises: Seated   Horizontal ABduction Strengthening;Both;20 reps;Theraband  TC to inhibit Bil UT 2x10   Theraband Level (Shoulder Horizontal ABduction) Level 3 (Green)   Horizontal ABduction Limitations tactile  cues to relax the upper trap   External Rotation Strengthening;Left;10 reps;Weights  shoulder at 90 degrees; 3 sets   External Rotation Weight (lbs) 1    Abduction Strengthening;Left;10 reps;Weights;Limitations  3 sets   ABduction Weight (lbs) 1   ABduction Limitations therapist guides scapula   Shoulder Exercises: Standing   Internal Rotation AAROM;Left;10 reps  with therapist guidance   Flexion Both;10 reps;Weights;Other (comment)  therapist guiding left scapula; 3 sets   Shoulder Flexion Weight (lbs) 1   Shoulder Exercises: ROM/Strengthening   UBE (Upper Arm Bike) L2 3x3    Manual Therapy   Manual Therapy Soft tissue mobilization;Joint mobilization;Passive ROM   Joint Mobilization T1-T6 for rotation and sidebending grade 3; A-C joint mobilition grade 3; posterior mobilization to left shoulder   Soft tissue  mobilization anterior left shoulder and left upper trapezius   Passive ROM left shoulder for IR reaching behind her back using a towel roll for distraction                PT Education - 12/18/14 1611    Education provided No          PT Short Term Goals - 12/18/14 1612    PT SHORT TERM GOAL #3   Title She will increase her FOTO score by > 10 points to assist with increased functional capacity (12/19/2014)   Time 3   Period Weeks   Status On-going  Not done yet           PT Long Term Goals - 11/28/14 1629    PT LONG TERM GOAL #1   Title pt will be I upon discharge with all exercises given throught therapy (01/09/2015)   Time 6   Period Weeks   Status New   PT LONG TERM GOAL #2   Title She will Increase L shoulder [facility] to assist with ADLS and personal hygiene (01/09/2015)   Time 6   Period Weeks   Status New   PT LONG TERM GOAL #3   Title She will demonstrate < 2/10 pain during and following overhead lifting and pushing and pulling > 5# to assist with work related task (01/09/2015)   Time 6   Period Weeks   Status New   PT LONG TERM GOAL #4   Title She will increase L shoulder strength to <4/5 to assist with ADLs and work related activities (01/09/2015)   Time 6   Period Weeks   Status New   PT LONG TERM GOAL #5   Title She will increase her FOTO score to > 72 upon discharge to indicate improved functional capacity (01/09/2015)   Time 6   Period Weeks   Status New               Plan - 12/18/14 1613    Clinical Impression Statement Patient has increased left shoulder AROM as follows: flexion 150, abduction 142, and external rotation to 52 degrees.  Patient is not able to get an item out of her back pocket.  Patient left shoulder fatiques with strengthening.  Patient left scapula needs assistance to move correctly with overhead movements. Patient is now able to use green theraband for horizontal abduction and exercise with 1 pound weight.  Patient A-C joint has decresaed mobility and T1-T5 has decresaed mobility.    Pt will benefit from skilled therapeutic intervention in order to improve on the following deficits Pain;Impaired UE functional use;Decreased balance;Decreased strength;Postural dysfunction;Improper body mechanics;Hypomobility;Decreased activity tolerance;Decreased endurance;Decreased range  of motion;Increased muscle spasms   Rehab Potential Good   PT Frequency 2x / week   PT Duration 6 weeks   PT Treatment/Interventions ADLs/Self Care Home Management;Iontophoresis 4mg /ml Dexamethasone;Cryotherapy;Electrical Stimulation;Ultrasound;Moist Heat;Therapeutic activities;Therapeutic exercise;Neuromuscular re-education;Manual techniques;Taping;Dry needling;Patient/family education   PT Next Visit Plan left shoulder and scapula strength, use manual skills to move the left scapula with overhead movements.    PT Home Exercise Plan progress as needed   Consulted and Agree with Plan of Care Patient        Problem List There are no active problems to display for this patient.   Griffin Gerrard,PT 12/18/2014, 4:17 PM  [facility] [address] [place], [place], [address] Phone: [phone]   Fax:  [phone]

## 2014-12-20 ENCOUNTER — Ambulatory Visit: Admitting: Physical Therapy

## 2014-12-20 ENCOUNTER — Encounter: Payer: Self-pay | Admitting: Physical Therapy

## 2014-12-20 DIAGNOSIS — R293 Abnormal posture: Secondary | ICD-10-CM

## 2014-12-20 DIAGNOSIS — R29898 Other symptoms and signs involving the musculoskeletal system: Secondary | ICD-10-CM

## 2014-12-20 DIAGNOSIS — M25512 Pain in left shoulder: Secondary | ICD-10-CM

## 2014-12-20 DIAGNOSIS — M25612 Stiffness of left shoulder, not elsewhere classified: Secondary | ICD-10-CM

## 2014-12-20 NOTE — Therapy (Signed)
[facility] [address], [address] [place], [place], [address] Phone: [phone]   Fax:  [phone]  Physical Therapy Treatment  Patient Details  Name: Melissa Maymy L [name] MRN: [id number] Date of Birth: 05/06/1966 Referring Provider:  Cammy Copaean, Scott Gregory, MD  Encounter Date: 12/20/2014      PT End of Session - 12/20/14 1613    Visit Number 6   Number of Visits 12   Date for PT Re-Evaluation 01/09/15   PT Start Time 1533   PT Stop Time 1614   PT Time Calculation (min) 41 min   Activity Tolerance Patient tolerated treatment well   Behavior [facility] Therapy [facility] for tasks assessed/performed      Past Medical History  Diagnosis Date  . Complication of anesthesia   . PONV (postoperative nausea and vomiting)   . Diabetes mellitus without complication     Type II  . Dysrhythmia     Tachycardia  . Shortness of breath dyspnea     with exertion  . Pneumonia     2014- 'Walking"  . GERD (gastroesophageal reflux disease)     Past Surgical History  Procedure Laterality Date  . Tonsillectomy  2010  . Colonoscopy  2015  . Cholecystectomy  1997  . Cystoscopy      age 66- stretched stem of bladder  . 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 Shoulder arthroscopy Left 11/20/2014    Procedure: ARTHROSCOPY SHOULDER WITH MUA, ROTATOR INTERVAL RELEASE;  Surgeon: Cammy CopaScott Gregory Dean, MD;  Location: [facility] OR;  Service: Orthopedics;  Laterality: Left;  LEFT SHOULDER MUA, DOA, ROTATOR INTERVAL RELEASE.    There were no vitals filed for this visit.  Visit Diagnosis:  Weakness of left arm  Left shoulder pain  Decreased ROM of left shoulder  Posture abnormality      Subjective Assessment - 12/20/14 1538    Subjective Just come from work, but my left arm stays sore   Currently in Pain? Yes   Pain Score 2    Pain Location Shoulder   Pain Orientation Left   Pain Descriptors / Indicators Dull;Aching   Pain Type Surgical pain   Pain Onset More  than a month ago   Pain Frequency Intermittent   Multiple Pain Sites No                         [facility] Adult PT Treatment/Exercise - 12/20/14 0001    Shoulder Exercises: Standing   ABduction Strengthening;20 reps;Theraband   Theraband Level (Shoulder ABduction) Level 2 (Red)  standing agains wall to contro trunk extension   Extension Strengthening;Both;20 reps;Theraband   Theraband Level (Shoulder Extension) Level 2 (Red)   Other Standing Exercises Scapular mob: bilelevation/depression into abd with elbow bend in front of mirrow   Shoulder Exercises: ROM/Strengthening   UBE (Upper Arm Bike) L2 6 (3/3)  sitting on green physioball   Shoulder Exercises: Stretch   Other Shoulder Stretches IR behind the back stretch with towel 3x 15 sec   Other Shoulder Stretches abduction stretch leaning with Lt side against wall   Manual Therapy   Manual Therapy Soft tissue mobilization;Joint mobilization;Passive ROM  in Rt sidelying to left scapula with PROM Lt UE                  PT Short Term Goals - 12/18/14 1612    PT SHORT TERM GOAL #3   Title She will increase her  FOTO score by > 10 points to assist with increased functional capacity (12/19/2014)   Time 3   Period Weeks   Status On-going  Not done yet           PT Long Term Goals - 11/28/14 1629    PT LONG TERM GOAL #1   Title pt will be I upon discharge with all exercises given throught therapy (01/09/2015)   Time 6   Period Weeks   Status New   PT LONG TERM GOAL #2   Title She will Increase L shoulder AROM to [facility] compared bil to assist with ADLS and personal hygiene (01/09/2015)   Time 6   Period Weeks   Status New   PT LONG TERM GOAL #3   Title She will demonstrate < 2/10 pain during and following overhead lifting and pushing and pulling > 5# to assist with work related task (01/09/2015)   Time 6   Period Weeks   Status New   PT LONG TERM GOAL #4   Title She will increase L shoulder strength to <4/5  to assist with ADLs and work related activities (01/09/2015)   Time 6   Period Weeks   Status New   PT LONG TERM GOAL #5   Title She will increase her FOTO score to > 72 upon discharge to indicate improved functional capacity (01/09/2015)   Time 6   Period Weeks   Status New               Plan - 12/20/14 1614    Clinical Impression Statement Pt continues to expierience weakness left shoulder and compensatory m ovement with available range of motion. Pt needs assistance to  move correctly with overhead movements. Pt will benefit from skilled Pt to address weakness and decreased ROM    Pt will benefit from skilled therapeutic intervention in order to improve on the following deficits Pain;Impaired UE functional use;Decreased balance;Decreased strength;Postural dysfunction;Improper body mechanics;Hypomobility;Decreased activity tolerance;Decreased endurance;Decreased range of motion;Increased muscle spasms   Rehab Potential Good   PT Frequency 2x / week   PT Duration 8 weeks   PT Treatment/Interventions ADLs/Self Care Home Management;Iontophoresis /ml Dexamethasone;Cryotherapy;Electrical Stimulation;Ultrasound;Moist Heat;Therapeutic activities;Therapeutic exercise;Neuromuscular re-education;Manual techniques;Taping;Dry needling;Patient/family education   PT Next Visit Plan Continue with L:eft shoulder strength and scapular mob use manual skills to move the left scapula with overhead movements.   Consulted and Agree with Plan of Care Patient        Problem List There are no active problems to display for this patient.   NAUMANN-HOUEGNIFIO,Kodi Steil PTA 12/20/2014, 5:28 PM  [facility] [address], [address] [place], [place], [address] Phone: [phone]   Fax:  [phone]

## 2014-12-24 ENCOUNTER — Encounter: Admitting: Physical Therapy

## 2014-12-24 ENCOUNTER — Ambulatory Visit

## 2014-12-27 ENCOUNTER — Encounter: Admitting: Physical Therapy

## 2014-12-27 ENCOUNTER — Encounter: Payer: Self-pay | Admitting: Physical Therapy

## 2014-12-27 ENCOUNTER — Ambulatory Visit: Admitting: Physical Therapy

## 2014-12-27 DIAGNOSIS — M25612 Stiffness of left shoulder, not elsewhere classified: Secondary | ICD-10-CM

## 2014-12-27 DIAGNOSIS — M25512 Pain in left shoulder: Secondary | ICD-10-CM

## 2014-12-27 DIAGNOSIS — R293 Abnormal posture: Secondary | ICD-10-CM

## 2014-12-27 DIAGNOSIS — R29898 Other symptoms and signs involving the musculoskeletal system: Secondary | ICD-10-CM

## 2014-12-27 NOTE — Therapy (Signed)
[facility] [address], [address] [place], [place], [address] Phone: [phone]   Fax:  [phone]  Physical Therapy Treatment  Patient Details  Name: Melissa [name] MRN: [id number] Date of Birth: 08/12/1965 Referring Provider:  Cammy Copaean, Scott Gregory, MD  Encounter Date: 12/27/2014      PT End of Session - 12/27/14 1630    Visit Number 7   Number of Visits 12   Date for PT Re-Evaluation 01/09/15   PT Start Time 1616   PT Stop Time 1658   PT Time Calculation (min) 42 min   Activity Tolerance Patient tolerated treatment well   Behavior During Therapy [facility] for tasks assessed/performed      Past Medical History  Diagnosis Date  . Complication of anesthesia   . PONV (postoperative nausea and vomiting)   . Diabetes mellitus without complication     Type II  . Dysrhythmia     Tachycardia  . Shortness of breath dyspnea     with exertion  . Pneumonia     2014- 'Walking"  . GERD (gastroesophageal reflux disease)     Past Surgical History  Procedure Laterality Date  . Tonsillectomy  2010  . Colonoscopy  2015  . Cholecystectomy  1997  . Cystoscopy      age 75- stretched stem of bladder  . 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 Shoulder arthroscopy Left 11/20/2014    Procedure: ARTHROSCOPY SHOULDER WITH MUA, ROTATOR INTERVAL RELEASE;  Surgeon: Cammy CopaScott Gregory Dean, MD;  Location: [facility];  Service: Orthopedics;  Laterality: Left;  LEFT SHOULDER MUA, DOA, ROTATOR INTERVAL RELEASE.    There were no vitals filed for this visit.  Visit Diagnosis:  Weakness of left arm  Left shoulder pain  Decreased ROM of left shoulder  Posture abnormality      Subjective Assessment - 12/27/14 1625    Subjective Just come from work, my left arm is doing well, except can not sleep on it or reaching behind. All over the shoulde feels sore.   Limitations Lifting;House hold activities   How long can you sit comfortably? unlimited   How long  can you stand comfortably? unlimited   How long can you walk comfortably? unlimited   Diagnostic tests MRI 03/09/2015 Minimal tendinosis of the supraspinatus tendon without a discrete   Patient Stated Goals to be able to reach, and improve range and strength.    Currently in Pain? Yes   Pain Score 2    Pain Location Shoulder   Pain Orientation Left   Pain Descriptors / Indicators Dull;Aching   Pain Type Surgical pain   Pain Onset More than a month ago   Pain Frequency Intermittent                         [facility] Adult PT Treatment/Exercise - 12/27/14 0001    Shoulder Exercises: Supine   Other Supine Exercises sidelying Rt Lt UE into abd and flexion with 2# weight   Shoulder Exercises: Standing   ABduction Strengthening;20 reps;Theraband   Theraband Level (Shoulder ABduction) Level 2 (Red)   Extension Strengthening;Both;20 reps;Theraband   Theraband Level (Shoulder Extension) Level 2 (Red)   Row Strengthening;Both;20 reps;Theraband   Theraband Level (Shoulder Row) Level 2 (Red)   Other Standing Exercises Scapular mob: bil elevation/depression into abd with elbow bend in front of mirrow   Shoulder Exercises: ROM/Strengthening   UBE (Upper Arm Bike) L2 6 (3/3)  may increase time and resistance, sit on green ball   Other ROM/Strengthening Exercises prone rows and extension with 2# 2x10   Manual Therapy   Manual Therapy Soft tissue mobilization;Joint mobilization;Passive ROM  in supine to upper trapezius, TP at [facility] joint                  PT Short Term Goals - 12/27/14 1700    PT SHORT TERM GOAL #1   Title pt will be I with basic HEP (12/19/2014)   Time 3   Period Weeks   Status Achieved   PT SHORT TERM GOAL #2   Title She will be able to verbalize and demonstrate techniques to reduce inflammation via RICE method(12/19/2014)   Time 3   Period Weeks   Status Achieved   PT SHORT TERM GOAL #3   Title She will increase her FOTO score by > 10 points to assist  with increased functional capacity (12/19/2014)   Time 3   Period Weeks   Status On-going           PT Long Term Goals - 12/27/14 1701    PT LONG TERM GOAL #1   Title pt will be I upon discharge with all exercises given throught therapy (01/09/2015)   Time 6   Period Weeks   Status On-going   PT LONG TERM GOAL #2   Title She will Increase L shoulder AROM to [facility] compared bil to assist with ADLS and personal hygiene (01/09/2015)   Time 6   Period Weeks   Status On-going   PT LONG TERM GOAL #3   Title She will demonstrate < 2/10 pain during and following overhead lifting and pushing and pulling > 5# to assist with work related task (01/09/2015)   Time 6   Period Weeks   Status Achieved   PT LONG TERM GOAL #4   Title She will increase L shoulder strength to <4/5 to assist with ADLs and work related activities (01/09/2015)   Time 6   Period Weeks   Status On-going   PT LONG TERM GOAL #5   Title She will increase her FOTO score to > 72 upon discharge to indicate improved functional capacity (01/09/2015)   Time 6   Period Weeks   Status On-going               Plan - 12/27/14 1631    Clinical Impression Statement Pt continues to present with compensatory movement with Left shoulder and weakness and limited endurance. Pt needs assitance to move scapula correctly with overhead movements. Pt will continue to benefit from skilled PT  to address  weakness and decreased ROM.   Pt will benefit from skilled therapeutic intervention in order to improve on the following deficits Pain;Impaired UE functional use;Decreased balance;Decreased strength;Postural dysfunction;Improper body mechanics;Hypomobility;Decreased activity tolerance;Decreased endurance;Decreased range of motion;Increased muscle spasms   Rehab Potential Good   PT Frequency 2x / week   PT Duration 8 weeks   PT Treatment/Interventions ADLs/Self Care Home Management;Iontophoresis /ml Dexamethasone;Cryotherapy;Electrical  Stimulation;Ultrasound;Moist Heat;Therapeutic activities;Therapeutic exercise;Neuromuscular re-education;Manual techniques;Taping;Dry needling;Patient/family education   PT Next Visit Plan Continue with left shoulder strength and scapular mob, use manual skills to move the left scapula with overhead movements   PT Home Exercise Plan progress as needed   Consulted and Agree with Plan of Care Patient        Problem List There are no active problems to display for this patient.   NAUMANN-HOUEGNIFIO,Laray Corbit PTA 12/27/2014, 5:09 PM  [facility] [address] [place], [place], [address] Phone: [phone]   Fax:  [phone]

## 2014-12-31 ENCOUNTER — Encounter: Admitting: Physical Therapy

## 2014-12-31 ENCOUNTER — Encounter

## 2015-01-02 ENCOUNTER — Encounter: Admitting: Physical Therapy

## 2015-01-03 ENCOUNTER — Ambulatory Visit: Admitting: Physical Therapy

## 2015-01-03 DIAGNOSIS — R29898 Other symptoms and signs involving the musculoskeletal system: Secondary | ICD-10-CM | POA: Diagnosis not present

## 2015-01-03 DIAGNOSIS — M25612 Stiffness of left shoulder, not elsewhere classified: Secondary | ICD-10-CM

## 2015-01-03 NOTE — Therapy (Signed)
[facility] [address] [place], [place], [address] Phone: [phone]   Fax:  [phone]  Physical Therapy Treatment  Patient Details  Name: Melissa [name] MRN: [id number] Date of Birth: [birth year] Referring Provider:  Meredith Pel, MD  Encounter Date: 01/03/2015      PT End of Session - 01/03/15 1614    Visit Number 8   Number of Visits 12   Date for PT Re-Evaluation 01/09/15   PT Start Time 4142   PT Stop Time 1645   PT Time Calculation (min) 31 min      Past Medical History  Diagnosis Date  . Complication of anesthesia   . PONV (postoperative nausea and vomiting)   . Diabetes mellitus without complication     Type II  . Dysrhythmia     Tachycardia  . Shortness of breath dyspnea     with exertion  . Pneumonia     2014- 'Walking"  . GERD (gastroesophageal reflux disease)     Past Surgical History  Procedure Laterality Date  . Tonsillectomy  2010  . Colonoscopy  2015  . Cholecystectomy  1997  . Cystoscopy      age 17- stretched stem of bladder  . 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 Shoulder arthroscopy Left 11/20/2014    Procedure: ARTHROSCOPY SHOULDER WITH MUA, ROTATOR INTERVAL RELEASE;  Surgeon: Meredith Pel, MD;  Location: [place];  Service: Orthopedics;  Laterality: Left;  LEFT SHOULDER MUA, DOA, ROTATOR INTERVAL RELEASE.    There were no vitals filed for this visit.  Visit Diagnosis:  Decreased ROM of left shoulder  Weakness of left arm      Subjective Assessment - 01/03/15 1615    Subjective Patient here just from work and is very tired. My left arm is pretty good, it's not bad.   Currently in Pain? Yes   Pain Score 2    Pain Location Shoulder   Pain Orientation Left   Pain Descriptors / Indicators Aching;Dull   Pain Type Surgical pain   Pain Relieving Factors ibuprofen prn            [facility] PT Assessment - 01/03/15 0001    PROM   Left Shoulder Extension 49  Degrees   Left Shoulder Flexion 125 Degrees  no pain    Left Shoulder ABduction 128 Degrees   Left Shoulder Internal Rotation 63 Degrees  Limited reaching behind back thumb to L3   Left Shoulder External Rotation 36 Degrees  Able to reach behind head easily   Strength   Left Shoulder Flexion --  5-/5   Left Shoulder Extension 5/5   Left Shoulder ABduction 4+/5   Left Shoulder Internal Rotation 5/5   Left Shoulder External Rotation 5/5                     [facility] Adult PT Treatment/Exercise - 01/03/15 0001    Shoulder Exercises: Standing   ABduction Strengthening;Theraband  x 30   Theraband Level (Shoulder ABduction) Level 2 (Red)   Extension Strengthening;Both;Theraband  x 30   Theraband Level (Shoulder Extension) Level 3 (Green)   Row Strengthening;Both;20 reps;Theraband   Theraband Level (Shoulder Row) Level 3 (Green)   Shoulder Exercises: ROM/Strengthening   UBE (Upper Arm Bike) L2 6 (3/3)  patient did not want increased resistance today. up next tim   Other ROM/Strengthening Exercises prone rows and extension with 2# 3x10   Other ROM/Strengthening  Exercises prone T no weight 2x   Manual Therapy   Joint Mobilization Scapular mobs for IR                  PT Short Term Goals - 12/27/14 1700    PT SHORT TERM GOAL #1   Title pt will be I with basic HEP (12/19/2014)   Time 3   Period Weeks   Status Achieved   PT SHORT TERM GOAL #2   Title She will be able to verbalize and demonstrate techniques to reduce inflammation via RICE method(12/19/2014)   Time 3   Period Weeks   Status Achieved   PT SHORT TERM GOAL #3   Title She will increase her FOTO score by > 10 points to assist with increased functional capacity (12/19/2014)   Time 3   Period Weeks   Status On-going           PT Long Term Goals - 01/03/15 1655    PT LONG TERM GOAL #1   Title pt will be I upon discharge with all exercises given throught therapy (01/09/2015)   Time 6   Period Weeks    Status On-going   PT LONG TERM GOAL #2   Title She will Increase L shoulder AROM to [facility] compared bil to assist with ADLS and personal hygiene (01/09/2015)   Time 6   Period Weeks   Status On-going   PT LONG TERM GOAL #3   Title She will demonstrate < 2/10 pain during and following overhead lifting and pushing and pulling > 5# to assist with work related task (01/09/2015)   Time 6   Period Weeks   Status Achieved   PT LONG TERM GOAL #4   Title She will increase L shoulder strength to <4/5 to assist with ADLs and work related activities (01/09/2015)  goal should be for > 4/5 strength   Time 6   Period Weeks   Status Achieved   PT LONG TERM GOAL #5   Title She will increase her FOTO score to > 72 upon discharge to indicate improved functional capacity (01/09/2015)   Time 6   Period Weeks   Status On-going               Plan - 01/03/15 1654    Clinical Impression Statement Patient is progressing with her ROM and has met her strength goals. She still has decreased mid trap strength on the left. Her greatest limitation functionally is reaching behind her back. She will benefit from continued PT to gain full functional ROM and strength.    PT Next Visit Plan Continue with left shoulder strength and scapular mob, use manual skills to move the left scapula with overhead movements. Also focus on gaining IR (behind back).        Problem List There are no active problems to display for this patient.   Almyra Free Liora Myles PT  01/03/2015, 5:02 PM  [facility] [address], [place], [place], [address] Phone: [phone]   Fax:  [phone]

## 2015-01-07 ENCOUNTER — Ambulatory Visit

## 2015-01-07 DIAGNOSIS — M25612 Stiffness of left shoulder, not elsewhere classified: Secondary | ICD-10-CM

## 2015-01-07 DIAGNOSIS — R29898 Other symptoms and signs involving the musculoskeletal system: Secondary | ICD-10-CM | POA: Diagnosis not present

## 2015-01-07 DIAGNOSIS — R293 Abnormal posture: Secondary | ICD-10-CM

## 2015-01-07 NOTE — Therapy (Signed)
[facility] [address] [place], [place], [address] Phone: [phone]   Fax:  [phone]  Physical Therapy Treatment  Patient Details  Name: Melissa [name] MRN: [id number] Date of Birth: 01-Jul-1965 Referring Provider:  Meredith Pel, MD  Encounter Date: 01/07/2015      PT End of Session - 01/07/15 1649    Visit Number 9   PT Start Time 4944   PT Stop Time 1649   PT Time Calculation (min) 36 min   Activity Tolerance Patient tolerated treatment well   Behavior During Therapy [facility] for tasks assessed/performed      Past Medical History  Diagnosis Date  . Complication of anesthesia   . PONV (postoperative nausea and vomiting)   . Diabetes mellitus without complication     Type II  . Dysrhythmia     Tachycardia  . Shortness of breath dyspnea     with exertion  . Pneumonia     2014- 'Walking"  . GERD (gastroesophageal reflux disease)     Past Surgical History  Procedure Laterality Date  . Tonsillectomy  2010  . Colonoscopy  2015  . Cholecystectomy  1997  . Cystoscopy      age 40- stretched stem of bladder  . 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 Shoulder arthroscopy Left 11/20/2014    Procedure: ARTHROSCOPY SHOULDER WITH MUA, ROTATOR INTERVAL RELEASE;  Surgeon: Meredith Pel, MD;  Location: [place];  Service: Orthopedics;  Laterality: Left;  LEFT SHOULDER MUA, DOA, ROTATOR INTERVAL RELEASE.    There were no vitals filed for this visit.  Visit Diagnosis:  Decreased ROM of left shoulder  Weakness of left arm  Posture abnormality      Subjective Assessment - 01/07/15 1620    Subjective Ready for D/C.  Lt shoulder feels a little bit weak but I can do what I want to do.     Currently in Pain? Yes   Pain Score 1    Pain Location Shoulder   Pain Orientation Left   Pain Descriptors / Indicators Aching;Dull   Pain Type Surgical pain   Pain Frequency Intermittent   Aggravating Factors  end of  day, working   Pain Relieving Factors ibuprofen prn   Multiple Pain Sites No            [facility] PT Assessment - 01/07/15 0001    Assessment   Medical Diagnosis L shoulder pain   s/p left shoulder arthroscopy and MUA   Onset Date/Surgical Date 11/20/14   Hand Dominance Right   Observation/Other Assessments   Focus on Therapeutic Outcomes (FOTO)  37% limited   PROM   Left Shoulder Extension 49 Degrees   Left Shoulder Flexion 125 Degrees  no pain    Left Shoulder ABduction 128 Degrees  lacking 4.5 inches vs the Rt   Left Shoulder Internal Rotation 63 Degrees  limited reaching behind back thumb to L3   Left Shoulder External Rotation 36 Degrees  able to reach behind head easily   Strength   Left Shoulder Flexion --  5-/5   Left Shoulder Extension 5/5   Left Shoulder ABduction 4+/5   Left Shoulder Internal Rotation 5/5   Left Shoulder External Rotation 5/5                     [facility] Adult PT Treatment/Exercise - 01/07/15 0001    Shoulder Exercises: Standing   ABduction Strengthening;Theraband  x  30   Theraband Level (Shoulder ABduction) Level 3 (Green)   Extension Strengthening;Both;Theraband  x 30   Theraband Level (Shoulder Extension) Level 3 (Green)   Row Strengthening;Both;20 reps;Theraband   Theraband Level (Shoulder Row) Level 3 (Green)   Shoulder Exercises: ROM/Strengthening   UBE (Upper Arm Bike) L2 6 (3/3)  patient did not want increased resistance today. up next tim   Other ROM/Strengthening Exercises prone rows and extension with 2# 3x10   Other ROM/Strengthening Exercises prone T no weight 2x   Shoulder Exercises: Isometric Strengthening   External Rotation Theraband   Theraband Level (External Rotation) Level 3 (Green)  2x10   Internal Rotation Theraband   Theraband Level (Internal Rotation) Level 3 (Green)  2x10                  PT Short Term Goals - 12/27/14 1700    PT SHORT TERM GOAL #1   Title pt will be I with basic HEP  (12/19/2014)   Time 3   Period Weeks   Status Achieved   PT SHORT TERM GOAL #2   Title She will be able to verbalize and demonstrate techniques to reduce inflammation via RICE method(12/19/2014)   Time 3   Period Weeks   Status Achieved   PT SHORT TERM GOAL #3   Title She will increase her FOTO score by > 10 points to assist with increased functional capacity (12/19/2014)   Time 3   Period Weeks   Status On-going           PT Long Term Goals - 01/07/15 1650    PT LONG TERM GOAL #1   Title pt will be I upon discharge with all exercises given throught therapy (01/09/2015)   Status Achieved   PT LONG TERM GOAL #2   Title She will Increase L shoulder AROM to [facility] compared bil to assist with ADLS and personal hygiene (01/09/2015)   Status Partially Met  see ROM measures   PT LONG TERM GOAL #3   Title She will demonstrate < 2/10 pain during and following overhead lifting and pushing and pulling > 5# to assist with work related task (01/09/2015)   Status Achieved   PT LONG TERM GOAL #4   Title She will increase L shoulder strength to <4/5 to assist with ADLs and work related activities (01/09/2015)   Status Achieved   PT LONG TERM GOAL #5   Title She will increase her FOTO score to > 72 upon discharge to indicate improved functional capacity (01/09/2015)   Status Achieved               Plan - 01/07/15 1641    Clinical Impression Statement Pt with mild Lt shoulder weakness and limited AROM into IR.  Pt has comprehensive HEP and will continue with this for continued gains.  Pt will D/C to HEP and follow-up with MD as needed.     PT Next Visit Plan D/C PT to HEP   Consulted and Agree with Plan of Care Patient        Problem List There are no active problems to display for this patient.  PHYSICAL THERAPY DISCHARGE SUMMARY  Visits from Start of Care: 9  Current functional level related to goals / functional outcomes: See above for goal assessment.     Remaining  deficits: Limited IR AROM and strength deficits s/p surgery.  Pt has HEP in place for continued gains.     Education / Equipment: HEP Plan:  Patient agrees to discharge.  Patient goals were partially met. Patient is being discharged due to being pleased with the current functional level.  ?????     Ahtziry Saathoff, PT 01/07/2015, 4:51 PM  [facility] [address] [place], [place], [address] Phone: [phone]   Fax:  [phone]

## 2015-01-10 ENCOUNTER — Encounter: Admitting: Physical Therapy

## 2015-06-07 ENCOUNTER — Ambulatory Visit (INDEPENDENT_AMBULATORY_CARE_PROVIDER_SITE_OTHER): Admitting: Primary Care

## 2015-06-07 ENCOUNTER — Encounter: Payer: Self-pay | Admitting: Primary Care

## 2015-06-07 VITALS — BP 118/76 | HR 76 | Temp 97.5°F | Ht 62.0 in | Wt 261.8 lb

## 2015-06-07 DIAGNOSIS — E119 Type 2 diabetes mellitus without complications: Secondary | ICD-10-CM

## 2015-06-07 DIAGNOSIS — K219 Gastro-esophageal reflux disease without esophagitis: Secondary | ICD-10-CM | POA: Diagnosis not present

## 2015-06-07 DIAGNOSIS — E1165 Type 2 diabetes mellitus with hyperglycemia: Secondary | ICD-10-CM | POA: Insufficient documentation

## 2015-06-07 DIAGNOSIS — R000 Tachycardia, unspecified: Secondary | ICD-10-CM | POA: Insufficient documentation

## 2015-06-07 DIAGNOSIS — R110 Nausea: Secondary | ICD-10-CM | POA: Insufficient documentation

## 2015-06-07 DIAGNOSIS — Z794 Long term (current) use of insulin: Secondary | ICD-10-CM

## 2015-06-07 MED ORDER — INSULIN GLARGINE 300 UNIT/ML ~~LOC~~ SOPN: 60.0000 [IU] | PEN_INJECTOR | Freq: Every day | SUBCUTANEOUS | Status: DC

## 2015-06-07 MED ORDER — ONDANSETRON 8 MG PO TBDP: 8.0000 mg | ORAL_TABLET | Freq: Three times a day (TID) | ORAL | Status: DC | PRN

## 2015-06-07 MED ORDER — INSULIN PEN NEEDLE 32G X 4 MM MISC: Status: DC

## 2015-06-07 NOTE — Assessment & Plan Note (Signed)
Diagnosed in 1997. Failed treatment with levemir, lantus, byetta. Currently on metformin, januvia, and toujeo. She is needing insurance authorization for toujeo as she's been using samples for the past several months.  Refill provided for pens and toujeo.  On micardis for renal protection. No statin, will obtain records for lipid panel. Endorses normal urine microalbumin, will obtain records.

## 2015-06-07 NOTE — Progress Notes (Signed)
Subjective:    Patient ID: Melissa [name], female    DOB: 22-Apr-1966, 49 y.o.   MRN: [id number]  HPI  [name] is a 49 year old female who presents today to establish care and discuss the problems mentioned below. Will obtain old records. Last physical was sometime in 2016.  1) Type 2 Diabetes: Diagnosed in 1997. Currently managed on Toujeo 60 units at bedtime, Metforman 1000 mg BID, and Januvia 100 mg. Her last A1C was 7.4 several months ago. She's failed treatment in the past with Byetta, Lantus, Levemir. She has not taken the Novolog as perscribed. The Toujeo has helped to get her A1C down from over 10 to 7.4. She feels it helps the best and is requesting refill and authorization. Currently on micardis 40 mg for renal protection. Believes she's had a urine microalbumin this year which was negative. Endorses a poor diet and does not exercise.   2) Tachycardia: Currently managed on Bystolic 10 mg for tachycardia. Resting heart rate off Bystolic is 115. Occasional chest pain. She follows with Dr. Claris Gower with cardiology in [place].   3) Depression: Treated in 2012 with Wellbutrin for overeating and a situational depression. She stayed on this medication for several years. Feels well overall now and denies concerns for anxiety or depression.   4) Frequent Headaches: History of migraines that are very infrequent now. Headaches occur several times monthly and will take aleve with improvement.   5) GERD: Diagnosed years ago. She does not currently take anything regularly. She is aware of her triggers.   Review of Systems  Constitutional: Negative for unexpected weight change.  HENT: Negative for rhinorrhea.   Respiratory: Negative for cough and shortness of breath.   Cardiovascular: Negative for chest pain.  Gastrointestinal: Negative for diarrhea and constipation.       Bouts of diarrhea, suspected IBS  Genitourinary: Negative for difficulty urinating.       No periods. History of  endometriosis, follow with GYN.  Musculoskeletal:       Some shoulder pain.  Skin: Negative for rash.  Allergic/Immunologic: Positive for environmental allergies.  Neurological: Negative for dizziness, numbness and headaches.  Psychiatric/Behavioral:       Denies concerns for anxiety or depression       Past Medical History  Diagnosis Date  . Complication of anesthesia   . PONV (postoperative nausea and vomiting)   . Diabetes mellitus without complication (HCC)     Type II  . Dysrhythmia     Tachycardia  . Shortness of breath dyspnea     with exertion  . Pneumonia     2014- 'Walking"  . GERD (gastroesophageal reflux disease)   . Asthma     Social History   Social History  . Marital Status: Married    Spouse Name: N/A  . Number of Children: N/A  . Years of Education: N/A   Occupational History  . Not on file.   Social History Main Topics  . Smoking status: Never Smoker   . Smokeless tobacco: Not on file  . Alcohol Use: 0.0 oz/week    0 Standard drinks or equivalent per week     Comment: rare  . Drug Use: No  . Sexual Activity: Not on file   Other Topics Concern  . Not on file   Social History Narrative   Married.   1 daughter.   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     Past  Surgical History  Procedure Laterality Date  . Tonsillectomy  2010  . Colonoscopy  2015  . Cholecystectomy  1997  . Cystoscopy      age 24- stretched stem of bladder  . 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 Shoulder arthroscopy Left 11/20/2014    Procedure: ARTHROSCOPY SHOULDER WITH MUA, ROTATOR INTERVAL RELEASE;  Surgeon: Cammy CopaScott Gregory Dean, MD;  Location: [facility] OR;  Service: Orthopedics;  Laterality: Left;  LEFT SHOULDER MUA, DOA, ROTATOR INTERVAL RELEASE.    Family History  Problem Relation Age of Onset  . Adopted: Yes  . Other      adopted    Allergies  Allergen Reactions  . Amoxicillin Anaphylaxis  . Ciprofloxacin Anaphylaxis  . Erythromycin  Anaphylaxis  . Other Anaphylaxis    ANTIBIOTICS!! PT CAN ONLY TAKE AVELOX AND LEVAQUIN AND ZITHROMAX  . Penicillins Anaphylaxis  . Sulfa Antibiotics Anaphylaxis  . Vicodin [Hydrocodone-Acetaminophen] Itching    Current Outpatient Prescriptions on File Prior to Visit  Medication Sig Dispense Refill  . medroxyPROGESTERone (DEPO-PROVERA) 150 MG/ML injection Inject 150 mg into the muscle every 3 (three) months.    . metFORMIN (GLUCOPHAGE) 1000 MG tablet Take 1,000 mg by mouth 2 (two) times daily with a meal.    . nebivolol (BYSTOLIC) 10 MG tablet Take 10 mg by mouth daily.    . sitaGLIPtin (JANUVIA) 100 MG tablet Take 100 mg by mouth daily.    Marland Kitchen. telmisartan (MICARDIS) 40 MG tablet Take 40 mg by mouth daily.    . insulin aspart (NOVOLOG) cartridge Inject into the skin 3 (three) times daily with meals. Reported on 06/07/2015    . Naproxen Sodium 220 MG CAPS Take 440 mg by mouth daily as needed. Reported on 06/07/2015    . omeprazole (PRILOSEC OTC) 20 MG tablet Take 20 mg by mouth as needed. Reported on 06/07/2015     No current facility-administered medications on file prior to visit.    BP 118/76 mmHg  Pulse 76  Temp(Src) 97.5 F (36.4 C) (Oral)  Ht 5\' 2"  (1.575 m)  Wt 261 lb 12.8 oz (118.752 kg)  BMI 47.87 kg/m2  SpO2 98%    Objective:   Physical Exam  Constitutional: She is oriented to person, place, and time. She appears well-nourished.  Cardiovascular: Normal rate and regular rhythm.   Pulmonary/Chest: Effort normal and breath sounds normal.  Neurological: She is alert and oriented to person, place, and time.  Skin: Skin is warm and dry.  Psychiatric: She has a normal mood and affect.          Assessment & Plan:

## 2015-06-07 NOTE — Patient Instructions (Signed)
I've sent refills to [facility] for your Zofran.  The pens and Toujeo have been sent in through [facility].  I will review your records and determine appropriate follow up.  It was a pleasure to meet you today! Please don't hesitate to call me with any questions. Welcome to [facility]!

## 2015-06-07 NOTE — Progress Notes (Signed)
Pre visit review using our clinic review tool, if applicable. No additional management support is needed unless otherwise documented below in the visit note. 

## 2015-06-07 NOTE — Assessment & Plan Note (Signed)
Does not take any medication regularly. Aware of triggers and understands weight loss would be helpful.

## 2015-06-07 NOTE — Assessment & Plan Note (Signed)
Managed on bystolic 10 mg. HR off medication resting is 115-120. Denies dizziness, chest pain.

## 2015-06-07 NOTE — Assessment & Plan Note (Signed)
Occasional with diabetic medications. Will require zofran PRN. Refills provided.

## 2015-06-18 ENCOUNTER — Telehealth: Payer: Self-pay | Admitting: Primary Care

## 2015-06-18 ENCOUNTER — Other Ambulatory Visit: Payer: Self-pay | Admitting: Primary Care

## 2015-06-18 DIAGNOSIS — Z794 Long term (current) use of insulin: Principal | ICD-10-CM

## 2015-06-18 DIAGNOSIS — E1165 Type 2 diabetes mellitus with hyperglycemia: Secondary | ICD-10-CM

## 2015-06-18 DIAGNOSIS — E785 Hyperlipidemia, unspecified: Secondary | ICD-10-CM

## 2015-06-18 NOTE — Telephone Encounter (Signed)
Please notify Melissa [name] that I've reviewed her records and she is due again now for an A1C and cholesterol check. Please ensure that she comes fasting for 4 hours. Will you please schedule a lab only appointment? She will be due for a complete physical in early May. Will you please schedule?   Thanks.

## 2015-06-19 ENCOUNTER — Encounter: Payer: Self-pay | Admitting: *Deleted

## 2015-06-19 NOTE — Telephone Encounter (Deleted)
Also notified patient on prior author on

## 2015-06-19 NOTE — Telephone Encounter (Addendum)
Called and spoken to patient stated that she will schedule the CPE when she later due to work schedule. She does not know until the work schedule comes out. Will be sending letter to reminder patient to schedule lab appointment prior to CPE appointment.

## 2015-06-24 ENCOUNTER — Encounter: Payer: Self-pay | Admitting: Primary Care

## 2015-08-05 ENCOUNTER — Ambulatory Visit (INDEPENDENT_AMBULATORY_CARE_PROVIDER_SITE_OTHER): Admitting: Primary Care

## 2015-08-05 ENCOUNTER — Encounter: Payer: Self-pay | Admitting: Primary Care

## 2015-08-05 VITALS — BP 120/76 | HR 86 | Temp 97.8°F | Ht 62.0 in | Wt 260.8 lb

## 2015-08-05 DIAGNOSIS — J029 Acute pharyngitis, unspecified: Secondary | ICD-10-CM | POA: Diagnosis not present

## 2015-08-05 MED ORDER — AZITHROMYCIN 250 MG PO TABS: ORAL_TABLET | ORAL | Status: DC

## 2015-08-05 NOTE — Progress Notes (Signed)
Subjective:    Patient ID: Melissa [name], female    DOB: [name] 01, 1967, 50 y.o.   MRN: [id number]  HPI  Melissa [name] is a 50 year old female who presents today with a chief complaint of lymphadenopathy. She also reports nausea, headache, ear pain, nausea, vomiting, diarrhea. Her symptoms have been present since Friday last week. Her main complaint is right sided lymph node/neck swelling and moderate/severe pain. Her nausea and vomiting have subsided and her diarrhea is slowing down.  She's taken 800 mg of ibuprofen for her symptoms. She's also taken Zofran for nausea and vomiting. She's running fevers of 101 if she does not take her iburpofen. Overall she's feeling fatigued. Denies sore throat, cough, nasal congestion, sinus pressure.  Review of Systems  Constitutional: Positive for fever and fatigue. Negative for chills.  HENT: Positive for congestion, sinus pressure and sore throat.   Respiratory: Negative for cough and shortness of breath.   Gastrointestinal: Positive for diarrhea. Negative for nausea and vomiting.  Neurological: Positive for headaches.       Past Medical History  Diagnosis Date  . Complication of anesthesia   . Diabetes mellitus without complication (HCC)     Type II  . Dysrhythmia     Tachycardia  . Shortness of breath dyspnea     with exertion  . Pneumonia     2014- 'Walking"  . GERD (gastroesophageal reflux disease)   . Asthma     Social History   Social History  . Marital Status: Married    Spouse Name: N/A  . Number of Children: N/A  . Years of Education: N/A   Occupational History  . Not on file.   Social History Main Topics  . Smoking status: Never Smoker   . Smokeless tobacco: Not on file  . Alcohol Use: 0.0 oz/week    0 Standard drinks or equivalent per week     Comment: rare  . Drug Use: No  . Sexual Activity: Not on file   Other Topics Concern  . Not on file   Social History Narrative   Married.   1 daughter.   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     Past Surgical History  Procedure Laterality Date  . Tonsillectomy  2010  . Colonoscopy  2015  . Cholecystectomy  1997  . Cystoscopy      age 45- stretched stem of bladder  . 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 Shoulder arthroscopy Left 11/20/2014    Procedure: ARTHROSCOPY SHOULDER WITH MUA, ROTATOR INTERVAL RELEASE;  Surgeon: Cammy Copa, MD;  Location: [facility] OR;  Service: Orthopedics;  Laterality: Left;  LEFT SHOULDER MUA, DOA, ROTATOR INTERVAL RELEASE.    Family History  Problem Relation Age of Onset  . Adopted: Yes  . Other      adopted    Allergies  Allergen Reactions  . Amoxicillin Anaphylaxis  . Ciprofloxacin Anaphylaxis  . Erythromycin Anaphylaxis  . Other Anaphylaxis    ANTIBIOTICS!! PT CAN ONLY TAKE AVELOX AND LEVAQUIN AND ZITHROMAX  . Penicillins Anaphylaxis  . Sulfa Antibiotics Anaphylaxis  . Vicodin [Hydrocodone-Acetaminophen] Itching    Current Outpatient Prescriptions on File Prior to Visit  Medication Sig Dispense Refill  . insulin aspart (NOVOLOG) cartridge Inject into the skin 3 (three) times daily with meals. Reported on 06/07/2015    . Insulin Glargine (TOUJEO SOLOSTAR) 300 UNIT/ML SOPN Inject 60 Units into the skin at  bedtime. 12 pen 3  . Insulin Pen Needle (BD PEN NEEDLE NANO U/F) 32G X 4 MM MISC AS DIRECTED INJECT THREE TIMES A DAY SUBQ 30 DAYS 100 each 11  . medroxyPROGESTERone (DEPO-PROVERA) 150 MG/ML injection Inject 150 mg into the muscle every 3 (three) months.    . metFORMIN (GLUCOPHAGE) 1000 MG tablet Take 1,000 mg by mouth 2 (two) times daily with a meal.    . Naproxen Sodium 220 MG CAPS Take 440 mg by mouth daily as needed. Reported on 06/07/2015    . nebivolol (BYSTOLIC) 10 MG tablet Take 10 mg by mouth daily.    Marland Kitchen omeprazole (PRILOSEC OTC) 20 MG tablet Take 20 mg by mouth as needed. Reported on 06/07/2015    . ondansetron (ZOFRAN ODT) 8 MG disintegrating tablet Take 1 tablet (8 mg total)  by mouth every 8 (eight) hours as needed for nausea or vomiting. 30 tablet 3  . sitaGLIPtin (JANUVIA) 100 MG tablet Take 100 mg by mouth daily.    Marland Kitchen telmisartan (MICARDIS) 40 MG tablet Take 40 mg by mouth daily.     No current facility-administered medications on file prior to visit.    BP 120/76 mmHg  Pulse 86  Temp(Src) 97.8 F (36.6 C) (Oral)  Ht  (1.575 m)  Wt 260 lb 12.8 oz (118.298 kg)  BMI 47.69 kg/m2  SpO2 97%    Objective:   Physical Exam  Constitutional: She appears well-nourished.  HENT:  Right Ear: Tympanic membrane and ear canal normal.  Left Ear: Tympanic membrane and ear canal normal.  Nose: Right sinus exhibits no maxillary sinus tenderness and no frontal sinus tenderness. Left sinus exhibits no maxillary sinus tenderness and no frontal sinus tenderness.  Mouth/Throat: Posterior oropharyngeal erythema present. No oropharyngeal exudate or posterior oropharyngeal edema.  Eyes: Conjunctivae are normal.  Neck:  Enlarged lymph node to right cervical chain just below right mandibular joint. Very tender.  Cardiovascular: Normal rate and regular rhythm.   Pulmonary/Chest: Effort normal and breath sounds normal. She has no wheezes. She has no rales.  Lymphadenopathy:    She has cervical adenopathy.  Skin: Skin is warm and dry. No erythema.          Assessment & Plan:  Cervical Adenopathy:  Recent GI symptoms of N/V/D with fevers of 101 consistently. Exam with 1.5 cm, very tender, lymph node to right cervical chain. ENT exam unremarkable. Suspect related to recent GI involvement. Due to presentation and high fevers, will treat empirically for bacterial involvement. RX for Zpak provided as she has multiple allergies. Ibuprofen, fluids, rest. Return precautions provided.

## 2015-08-05 NOTE — Patient Instructions (Signed)
Start Azithromycin antibiotics. Take 2 tablets by mouth today, then 1 tablet daily for 4 additional days.  Continue ibuprofen for fevers and inflammation.   Please call me if your fevers do not reduce or if your swelling becomes worse.  It was a pleasure to see you today!

## 2015-08-05 NOTE — Progress Notes (Signed)
Pre visit review using our clinic review tool, if applicable. No additional management support is needed unless otherwise documented below in the visit note. 

## 2015-08-22 ENCOUNTER — Other Ambulatory Visit: Payer: Self-pay

## 2015-08-22 DIAGNOSIS — I10 Essential (primary) hypertension: Secondary | ICD-10-CM

## 2015-08-22 MED ORDER — NEBIVOLOL HCL 10 MG PO TABS: 10.0000 mg | ORAL_TABLET | Freq: Every day | ORAL | Status: DC

## 2015-08-22 NOTE — Telephone Encounter (Signed)
Pt left v/m requesting refill bystolic [facility] mg [facility]. 06/07/15 visit to establish care notes that pt is taking Bystolic and is followed by Dr Claris GowerKahl cardi[place].Please advise.

## 2015-09-11 ENCOUNTER — Other Ambulatory Visit: Payer: Self-pay | Admitting: Primary Care

## 2015-09-11 DIAGNOSIS — E119 Type 2 diabetes mellitus without complications: Secondary | ICD-10-CM

## 2015-09-11 DIAGNOSIS — Z794 Long term (current) use of insulin: Principal | ICD-10-CM

## 2015-09-11 MED ORDER — SITAGLIPTIN PHOSPHATE 100 MG PO TABS: 100.0000 mg | ORAL_TABLET | Freq: Every day | ORAL | Status: DC

## 2015-09-11 NOTE — Telephone Encounter (Signed)
Received faxed refill request for sitagliptin (JANUVIA) 100 MG tablet.  Rx has not been prescribed by Jae DireKate. Last seen on 08/05/2015. No future appointment.

## 2016-01-27 ENCOUNTER — Encounter: Payer: Self-pay | Admitting: Internal Medicine

## 2016-01-27 ENCOUNTER — Ambulatory Visit (INDEPENDENT_AMBULATORY_CARE_PROVIDER_SITE_OTHER): Admitting: Internal Medicine

## 2016-01-27 DIAGNOSIS — E1165 Type 2 diabetes mellitus with hyperglycemia: Secondary | ICD-10-CM

## 2016-01-27 DIAGNOSIS — E785 Hyperlipidemia, unspecified: Secondary | ICD-10-CM | POA: Diagnosis not present

## 2016-01-27 DIAGNOSIS — J029 Acute pharyngitis, unspecified: Secondary | ICD-10-CM

## 2016-01-27 DIAGNOSIS — Z794 Long term (current) use of insulin: Secondary | ICD-10-CM

## 2016-01-27 MED ORDER — HYDROCODONE-HOMATROPINE 5-1.5 MG/5ML PO SYRP: 5.0000 mL | ORAL_SOLUTION | Freq: Three times a day (TID) | ORAL | 0 refills | Status: DC | PRN

## 2016-01-27 MED ORDER — ALBUTEROL SULFATE HFA 108 (90 BASE) MCG/ACT IN AERS: 2.0000 | INHALATION_SPRAY | Freq: Four times a day (QID) | RESPIRATORY_TRACT | 2 refills | Status: DC | PRN

## 2016-01-27 NOTE — Progress Notes (Signed)
Subjective:    Patient ID: Melissa [name], female    DOB: 10/22/1965, 50 y.o.   MRN: [id number]  HPI  Pt presents to the clinic today with a complaint of cough x 4 weeks.   She reports symptoms began with nasal congestion and runny nose which have improved, and have progressed to chest congestion.  She reports the cough is nonproductive.  She admits to shortness of breath with coughing spells, and states she has burning in her chest when she coughs.  She admits to intermittent bilateral ear pain that she describes as an ache at a severity of 2/10.  She admits to fever and chills the first 2 weeks with a Tmax of 101.7, but denies these symptoms over the last 2 weeks.  She denies sore throat, watery eyes or red eyes.  She has tried Mucinex, Mucinex D, Robitussin DM, Nyquil, Dayquil, Delsym, Tessalon, Albuterol, honey, salt water gargles, and cough drops with no relief.  She reports her son-in-law had similar symptoms 4 weeks ago, and his symptoms have resolved while hers have persisted.  She reports she gets asthmatic bronchitis once a year usually in the fall, and the current symptoms feel similar to prior episodes.  Review of Systems Past Medical History:  Diagnosis Date  . Asthma   . Complication of anesthesia   . Diabetes mellitus without complication (HCC)    Type II  . Dysrhythmia    Tachycardia  . GERD (gastroesophageal reflux disease)   . Pneumonia    2014- 'Walking"  . Shortness of breath dyspnea    with exertion   Past Surgical History:  Procedure Laterality Date  . CHOLECYSTECTOMY  1997  . COLONOSCOPY  2015  . CYSTOSCOPY     age 48- stretched stem of bladder  . 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 SHOULDER ARTHROSCOPY Left 11/20/2014   Procedure: ARTHROSCOPY SHOULDER WITH MUA, ROTATOR INTERVAL RELEASE;  Surgeon: Cammy CopaScott Gregory Dean, MD;  Location: [facility] OR;  Service: Orthopedics;  Laterality: Left;  LEFT SHOULDER MUA, DOA, ROTATOR INTERVAL RELEASE.  . TONSILLECTOMY  2010   Family  History  Problem Relation Age of Onset  . Adopted: Yes  . Other      adopted   Current Outpatient Prescriptions on File Prior to Visit  Medication Sig Dispense Refill  . insulin aspart (NOVOLOG) cartridge Inject into the skin 3 (three) times daily with meals. Reported on 06/07/2015    . Insulin Pen Needle (BD PEN NEEDLE NANO U/F) 32G X 4 MM MISC AS DIRECTED INJECT THREE TIMES A DAY SUBQ 30 DAYS 100 each 11  . medroxyPROGESTERone (DEPO-PROVERA) 150 MG/ML injection Inject 150 mg into the muscle every 3 (three) months.    . metFORMIN (GLUCOPHAGE) 1000 MG tablet Take 1,000 mg by mouth 2 (two) times daily with a meal.    . Naproxen Sodium 220 MG CAPS Take 440 mg by mouth daily as needed. Reported on 06/07/2015    . nebivolol (BYSTOLIC) 10 MG tablet Take 1 tablet (10 mg total) by mouth daily. 90 tablet 3  . omeprazole (PRILOSEC OTC) 20 MG tablet Take 20 mg by mouth as needed. Reported on 06/07/2015    . ondansetron (ZOFRAN ODT) 8 MG disintegrating tablet Take 1 tablet (8 mg total) by mouth every 8 (eight) hours as needed for nausea or vomiting. 30 tablet 3  . sitaGLIPtin (JANUVIA) 100 MG tablet Take 1 tablet (100 mg total) by mouth daily. 90 tablet 1  . telmisartan (  MICARDIS) 40 MG tablet Take 40 mg by mouth daily.    . Insulin Glargine (TOUJEO SOLOSTAR) 300 UNIT/ML SOPN Inject 60 Units into the skin at bedtime. (Patient not taking: Reported on 01/27/2016) 12 pen 3   No current facility-administered medications on file prior to visit.    Allergies  Allergen Reactions  . Amoxicillin Anaphylaxis  . Ciprofloxacin Anaphylaxis  . Erythromycin Anaphylaxis  . Other Anaphylaxis    ANTIBIOTICS!! PT CAN ONLY TAKE AVELOX AND LEVAQUIN AND ZITHROMAX  . Penicillins Anaphylaxis  . Sulfa Antibiotics Anaphylaxis  . Vicodin [Hydrocodone-Acetaminophen] Itching    Const: Pt reports fever and chills. HEENT: Pt reports bilateral ear pain, nasal congestion, and runny nose.  Denies sore throat, watery eyes, or  red eyes. Pulm: Pt report cough, shortness of breath, chest congestion, and burning in her chest when coughing.  Denies sputum production.     Objective:   Physical Exam  BP 120/80   Pulse 85   Temp 98.6 F (37 C) (Oral)   Wt 248 lb (112.5 kg)   SpO2 98%   BMI 45.36 kg/m   General:  Well-appearing, in no acute distress. HEENT: No scleral icterus or conjunctival injection.  TMs pearly grey, landmarks visible.  No erythema or discharge of the nose.  No erythema or exudates of the pharynx.  Right maxillary sinus tender to palpation. Neck: No lymphadenopathy noted. Pulm:  Clear to auscultation bilaterally.  No wheezes, rales, or rhonchi. CV: Regular rate and rhythm.  No murmurs, rubs, or gallops.     Assessment & Plan:   Viral URI with cough:  Rx for Hycodan 5ml q8hrs as needed for cough Zrytec daily  eRx refill for Albuterol inhaler Call if symptoms worsen or do not resolve  Kailan Carmen, NP

## 2016-01-27 NOTE — Patient Instructions (Signed)

## 2016-01-28 LAB — LIPID PANEL
Cholesterol: 199 mg/dL (ref 0–200)
HDL: 45.8 mg/dL (ref >39.00)
NonHDL: 153.03
TRIGLYCERIDES: 251 mg/dL — AB (ref 0.0–149.0)
Total CHOL/HDL Ratio: 4
VLDL: 50.2 mg/dL — ABNORMAL HIGH (ref 0.0–40.0)

## 2016-01-28 LAB — HEMOGLOBIN A1C: HEMOGLOBIN A1C: 7 % — AB (ref 4.6–6.5)

## 2016-01-28 LAB — LDL CHOLESTEROL, DIRECT: Direct LDL: 123 mg/dL

## 2016-03-20 ENCOUNTER — Telehealth: Payer: Self-pay | Admitting: Primary Care

## 2016-03-20 NOTE — Telephone Encounter (Signed)
Patient Name: Melissa [name]  DOB: 12/20/1965    Initial Comment Caller states having blood in stool, wanting to make appt   Nurse Assessment  Nurse: Stefano GaulStringer, RN, Dwana CurdVera Date/Time (Eastern Time): 03/20/2016 8:46:05 AM  Confirm and document reason for call. If symptomatic, describe symptoms. You must click the next button to save text entered. ---Caller states she is having blood in her stool. has had small amount of blood in the commode. Has happened 3-4 times. Has slight abd pain when she has to a BM  Has the patient traveled out of the country within the last 30 days? ---Not Applicable  Does the patient have any new or worsening symptoms? ---Yes  Will a triage be completed? ---Yes  Related visit to physician within the last 2 weeks? ---No  Does the PT have any chronic conditions? (i.e. diabetes, asthma, etc.) ---No  Is the patient pregnant or possibly pregnant? (Ask all females between the ages of 4512-55) ---No  Is this a behavioral health or substance abuse call? ---No     Guidelines    Guideline Title Affirmed Question Affirmed Notes  Rectal Bleeding MODERATE rectal bleeding (small blood clots, passing blood without stool, or toilet water turns red)    Final Disposition User   See Physician within 24 Hours Stringer, RN, Dwana CurdVera    Comments  Pt does not want to make appt for today but wants appt on Monday. Please call pt back to make appt   Referrals  GO TO FACILITY REFUSED   Disagree/Comply: Disagree  Disagree/Comply Reason: Disagree with instructions

## 2016-03-20 NOTE — Telephone Encounter (Signed)
I spoke with pt and last wk one time had loose BM with streaks of blood and reoccurred on 03/29/16. No pain now. Pt has abd pain when has BM, no fever, N&V or mucus in stool. Pt said loose stool is normal for pt. Pt could not come in for appt today but scheduled appt on 03/23/16 at 8:45 wit Mayra ReelKate Clark NP. 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

## 2016-03-20 NOTE — Telephone Encounter (Signed)
Noted  

## 2016-03-23 ENCOUNTER — Ambulatory Visit (INDEPENDENT_AMBULATORY_CARE_PROVIDER_SITE_OTHER): Admitting: Primary Care

## 2016-03-23 ENCOUNTER — Encounter: Payer: Self-pay | Admitting: Primary Care

## 2016-03-23 VITALS — BP 118/76 | HR 98 | Temp 97.7°F | Ht 62.0 in | Wt 251.0 lb

## 2016-03-23 DIAGNOSIS — G4700 Insomnia, unspecified: Secondary | ICD-10-CM | POA: Insufficient documentation

## 2016-03-23 DIAGNOSIS — R5383 Other fatigue: Secondary | ICD-10-CM | POA: Diagnosis not present

## 2016-03-23 LAB — COMPREHENSIVE METABOLIC PANEL
ALBUMIN: 3.7 g/dL (ref 3.5–5.2)
ALK PHOS: 98 U/L (ref 39–117)
ALT: 27 U/L (ref 0–35)
AST: 17 U/L (ref 0–37)
BILIRUBIN TOTAL: 0.3 mg/dL (ref 0.2–1.2)
BUN: 11 mg/dL (ref 6–23)
CO2: 26 mEq/L (ref 19–32)
Calcium: 9.1 mg/dL (ref 8.4–10.5)
Chloride: 102 mEq/L (ref 96–112)
Creatinine, Ser: 0.91 mg/dL (ref 0.40–1.20)
GFR: 70.13 mL/min (ref >60.00)
Glucose, Bld: 271 mg/dL — ABNORMAL HIGH (ref 70–99)
Potassium: 4.3 mEq/L (ref 3.5–5.1)
SODIUM: 136 meq/L (ref 135–145)
TOTAL PROTEIN: 7.2 g/dL (ref 6.0–8.3)

## 2016-03-23 LAB — CBC
HCT: 38 % (ref 36.0–46.0)
Hemoglobin: 12.9 g/dL (ref 12.0–15.0)
MCHC: 33.9 g/dL (ref 30.0–36.0)
MCV: 83.4 fl (ref 78.0–100.0)
Platelets: 235 10*3/uL (ref 150.0–400.0)
RBC: 4.56 Mil/uL (ref 3.87–5.11)
RDW: 15.1 % (ref 11.5–15.5)
WBC: 8.5 10*3/uL (ref 4.0–10.5)

## 2016-03-23 LAB — VITAMIN D 25 HYDROXY (VIT D DEFICIENCY, FRACTURES): VITD: 22.39 ng/mL — AB (ref 30.00–100.00)

## 2016-03-23 LAB — TSH: TSH: 2.67 u[IU]/mL (ref 0.35–4.50)

## 2016-03-23 LAB — VITAMIN B12: VITAMIN B 12: 1082 pg/mL — AB (ref 211–911)

## 2016-03-23 MED ORDER — TRAZODONE HCL 50 MG PO TABS: 25.0000 mg | ORAL_TABLET | Freq: Every evening | ORAL | 0 refills | Status: DC | PRN

## 2016-03-23 NOTE — Patient Instructions (Signed)
Your symptoms are similar to those of menopause.  Complete lab work prior to leaving today. I will notify you of your results once received.   It was a pleasure to see you today!  Menopause Menopause is the normal time of life when menstrual periods stop completely. Menopause is complete when you have missed 12 consecutive menstrual periods. It usually occurs between the ages of 48 years and 55 years. Very rarely does a woman develop menopause before the age of 40 years. At menopause, your ovaries stop producing the female hormones estrogen and progesterone. This can cause undesirable symptoms and also affect your health. Sometimes the symptoms may occur 4-5 years before the menopause begins. There is no relationship between menopause and:  Oral contraceptives.  Number of children you had.  Race.  The age your menstrual periods started (menarche). Heavy smokers and very thin women may develop menopause earlier in life. CAUSES  The ovaries stop producing the female hormones estrogen and progesterone.  Other causes include:  Surgery to remove both ovaries.  The ovaries stop functioning for no known reason.  Tumors of the pituitary gland in the brain.  Medical disease that affects the ovaries and hormone production.  Radiation treatment to the abdomen or pelvis.  Chemotherapy that affects the ovaries. SYMPTOMS   Hot flashes.  Night sweats.  Decrease in sex drive.  Vaginal dryness and thinning of the vagina causing painful intercourse.  Dryness of the skin and developing wrinkles.  Headaches.  Tiredness.  Irritability.  Memory problems.  Weight gain.  Bladder infections.  Hair growth of the face and chest.  Infertility. More serious symptoms include:  Loss of bone (osteoporosis) causing breaks (fractures).  Depression.  Hardening and narrowing of the arteries (atherosclerosis) causing heart attacks and strokes. DIAGNOSIS   When the menstrual periods  have stopped for 12 straight months.  Physical exam.  Hormone studies of the blood. TREATMENT  There are many treatment choices and nearly as many questions about them. The decisions to treat or not to treat menopausal changes is an individual choice made with your health care provider. Your health care provider can discuss the treatments with you. Together, you can decide which treatment will work best for you. Your treatment choices may include:   Hormone therapy (estrogen and progesterone).  Non-hormonal medicines.  Treating the individual symptoms with medicine (for example antidepressants for depression).  Herbal medicines that may help specific symptoms.  Counseling by a psychiatrist or psychologist.  Group therapy.  Lifestyle changes including:  Eating healthy.  Regular exercise.  Limiting caffeine and alcohol.  Stress management and meditation.  No treatment. HOME CARE INSTRUCTIONS   Take the medicine your health care provider gives you as directed.  Get plenty of sleep and rest.  Exercise regularly.  Eat a diet that contains calcium (good for the bones) and soy products (acts like estrogen hormone).  Avoid alcoholic beverages.  Do not smoke.  If you have hot flashes, dress in layers.  Take supplements, calcium, and vitamin D to strengthen bones.  You can use over-the-counter lubricants or moisturizers for vaginal dryness.  Group therapy is sometimes very helpful.  Acupuncture may be helpful in some cases. SEEK MEDICAL CARE IF:   You are not sure you are in menopause.  You are having menopausal symptoms and need advice and treatment.  You are still having menstrual periods after age 48 years.  You have pain with intercourse.  Menopause is complete (no menstrual period for 12 months) and  you develop vaginal bleeding.  You need a referral to a specialist (gynecologist, psychiatrist, or psychologist) for treatment. SEEK IMMEDIATE MEDICAL CARE  IF:   You have severe depression.  You have excessive vaginal bleeding.  You fell and think you have a broken bone.  You have pain when you urinate.  You develop leg or chest pain.  You have a fast pounding heart beat (palpitations).  You have severe headaches.  You develop vision problems.  You feel a lump in your breast.  You have abdominal pain or severe indigestion.   This information is not intended to replace advice given to you by your health care provider. Make sure you discuss any questions you have with your health care provider.   Document Released: 08/22/2003 Document Revised: 02/01/2013 Document Reviewed: 12/29/2012 Elsevier Interactive Patient Education [employer]

## 2016-03-23 NOTE — Assessment & Plan Note (Signed)
Ongoing for the past 3-4 months. Check CBC, CMP, TSH, Vitamin D and B 12. Likely related to menopause and/or sleep disturbance.

## 2016-03-23 NOTE — Progress Notes (Signed)
Subjective:    Patient ID: Melissa [name], female    DOB: 05/05/1966, 50 y.o.   MRN: [id number]  HPI  [name] is a 50 year old female with a history of GERD and type 2 diabetes who presents today with a chief complaint of fatigue.  She's noticed symptoms of her hair falling out, periods of hypoglycemia, increased fatigue, feeling tired all of the time. She is not sleeping throughout the night as she will wake numerous times throughout the night. This has been problematic for years, but more so increased over the past 3-4 months. She was previously managed on Ambien with improvement. She's tried OTC Melatonin which causes headaches and other OTC products without improvement. She is also experiencing hot flashes. Denies cold intolerance, unexplained weight gain or loss, palpitations.  She's stopped taking her insulin in early August due to hypoglycemic episodes. Her blood sugars are running between 130-140's on average and her last A1C was 7.0 in August.   Wt Readings from Last 3 Encounters:  03/23/16 251 lb (113.9 kg)  01/27/16 248 lb (112.5 kg)  08/05/15 260 lb 12.8 oz (118.3 kg)    Review of Systems  Constitutional: Positive for fatigue. Negative for chills and unexpected weight change.  Respiratory: Negative for shortness of breath.   Cardiovascular: Negative for chest pain and palpitations.  Endocrine: Negative for cold intolerance.  Genitourinary:       Hot flashes  Neurological: Negative for dizziness and weakness.       Past Medical History:  Diagnosis Date  . Asthma   . Complication of anesthesia   . Diabetes mellitus without complication (HCC)    Type II  . Dysrhythmia    Tachycardia  . GERD (gastroesophageal reflux disease)   . Pneumonia    2014- 'Walking"  . Shortness of breath dyspnea    with exertion     Social History   Social History  . Marital status: Married    Spouse name: N/A  . Number of children: N/A  . Years of education: N/A   Occupational  History  . Not on file.   Social History Main Topics  . Smoking status: Never Smoker  . Smokeless tobacco: Never Used  . Alcohol use 0.0 oz/week     Comment: rare  . Drug use: No  . Sexual activity: Not on file   Other Topics Concern  . Not on file   Social History Narrative   Married.   1 daughter.   Works at [employer].   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     Past Surgical History:  Procedure Laterality Date  . CHOLECYSTECTOMY  1997  . COLONOSCOPY  2015  . CYSTOSCOPY     age 53- stretched stem of bladder  . 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 SHOULDER ARTHROSCOPY Left 11/20/2014   Procedure: ARTHROSCOPY SHOULDER WITH MUA, ROTATOR INTERVAL RELEASE;  Surgeon: Cammy CopaScott Gregory Dean, MD;  Location: [facility] OR;  Service: Orthopedics;  Laterality: Left;  LEFT SHOULDER MUA, DOA, ROTATOR INTERVAL RELEASE.  . TONSILLECTOMY  2010    Family History  Problem Relation Age of Onset  . Adopted: Yes  . Other      adopted    Allergies  Allergen Reactions  . Amoxicillin Anaphylaxis  . Ciprofloxacin Anaphylaxis  . Erythromycin Anaphylaxis  . Other Anaphylaxis    ANTIBIOTICS!! PT CAN ONLY TAKE AVELOX AND LEVAQUIN AND ZITHROMAX  . Penicillins Anaphylaxis  . Sulfa Antibiotics Anaphylaxis  .  Vicodin [Hydrocodone-Acetaminophen] Itching    Current Outpatient Prescriptions on File Prior to Visit  Medication Sig Dispense Refill  . albuterol (PROVENTIL HFA;VENTOLIN HFA) 108 (90 Base) MCG/ACT inhaler Inhale 2 puffs into the lungs every 6 (six) hours as needed for wheezing or shortness of breath. 1 Inhaler 2  . medroxyPROGESTERone (DEPO-PROVERA) 150 MG/ML injection Inject 150 mg into the muscle every 3 (three) months.    . metFORMIN (GLUCOPHAGE) 1000 MG tablet Take 1,000 mg by mouth 2 (two) times daily with a meal.    . Naproxen Sodium 220 MG CAPS Take 440 mg by mouth daily as needed. Reported on 06/07/2015    . nebivolol (BYSTOLIC) 10 MG tablet Take 1 tablet (10 mg total) by mouth daily.  90 tablet 3  . omeprazole (PRILOSEC OTC) 20 MG tablet Take 20 mg by mouth as needed. Reported on 06/07/2015    . ondansetron (ZOFRAN ODT) 8 MG disintegrating tablet Take 1 tablet (8 mg total) by mouth every 8 (eight) hours as needed for nausea or vomiting. 30 tablet 3  . sitaGLIPtin (JANUVIA) 100 MG tablet Take 1 tablet (100 mg total) by mouth daily. 90 tablet 1  . telmisartan (MICARDIS) 40 MG tablet Take 40 mg by mouth daily.    . insulin aspart (NOVOLOG) cartridge Inject into the skin 3 (three) times daily with meals. Reported on 06/07/2015    . Insulin Glargine (TOUJEO SOLOSTAR) 300 UNIT/ML SOPN Inject 60 Units into the skin at bedtime. (Patient not taking: Reported on 03/23/2016) 12 pen 3  . Insulin Pen Needle (BD PEN NEEDLE NANO U/F) 32G X 4 MM MISC AS DIRECTED INJECT THREE TIMES A DAY SUBQ 30 DAYS (Patient not taking: Reported on 03/23/2016) 100 each 11   No current facility-administered medications on file prior to visit.     BP 118/76   Pulse 98   Temp 97.7 F (36.5 C) (Oral)   Ht 5\' 2"  (1.575 m)   Wt 251 lb (113.9 kg)   SpO2 98%   BMI 45.91 kg/m    Objective:   Physical Exam  Constitutional: She appears well-nourished.  Neck: Neck supple.  Cardiovascular: Normal rate and regular rhythm.   Pulmonary/Chest: Effort normal and breath sounds normal.  Skin: Skin is warm and dry.  Psychiatric: She has a normal mood and affect.          Assessment & Plan:

## 2016-03-23 NOTE — Progress Notes (Signed)
Pre visit review using our clinic review tool, if applicable. No additional management support is needed unless otherwise documented below in the visit note. 

## 2016-03-23 NOTE — Assessment & Plan Note (Signed)
Ongoing for years, previously managed on Ambien which caused her to wake and call friends during the night. Sleep maintenance insomnia mostly. Will trial low dose Trazodone since she's failed OTC treatment. Will continue to monitor.

## 2016-04-01 ENCOUNTER — Other Ambulatory Visit: Payer: Self-pay | Admitting: Primary Care

## 2016-04-01 DIAGNOSIS — E119 Type 2 diabetes mellitus without complications: Secondary | ICD-10-CM

## 2016-04-01 DIAGNOSIS — Z794 Long term (current) use of insulin: Principal | ICD-10-CM

## 2016-04-17 ENCOUNTER — Other Ambulatory Visit: Payer: Self-pay | Admitting: Primary Care

## 2016-04-17 DIAGNOSIS — Z794 Long term (current) use of insulin: Principal | ICD-10-CM

## 2016-04-17 DIAGNOSIS — E119 Type 2 diabetes mellitus without complications: Secondary | ICD-10-CM

## 2016-04-17 MED ORDER — METFORMIN HCL 1000 MG PO TABS: 1000.0000 mg | ORAL_TABLET | Freq: Two times a day (BID) | ORAL | 3 refills | Status: DC

## 2016-04-17 NOTE — Telephone Encounter (Signed)
Ok to refill? Electronically refill request for   metFORMIN (GLUCOPHAGE) 1000 MG tablet  Medication have not been prescribed by Jae DireKate. Last seen on 03/23/2016.

## 2016-06-11 ENCOUNTER — Encounter: Payer: Self-pay | Admitting: Primary Care

## 2016-06-12 MED ORDER — ONDANSETRON 8 MG PO TBDP: 8.0000 mg | ORAL_TABLET | Freq: Three times a day (TID) | ORAL | 0 refills | Status: DC | PRN

## 2016-06-22 ENCOUNTER — Other Ambulatory Visit: Payer: Self-pay | Admitting: Primary Care

## 2016-06-22 DIAGNOSIS — G4700 Insomnia, unspecified: Secondary | ICD-10-CM

## 2016-06-22 NOTE — Telephone Encounter (Signed)
Ok to refill? Electronically refill request for   traZODone (DESYREL) 50 MG tablet  Last prescribed and seen on 03/23/2016.

## 2016-09-22 ENCOUNTER — Other Ambulatory Visit: Payer: Self-pay | Admitting: Primary Care

## 2016-09-22 DIAGNOSIS — I10 Essential (primary) hypertension: Secondary | ICD-10-CM

## 2016-10-05 ENCOUNTER — Emergency Department (HOSPITAL_COMMUNITY)
Admission: EM | Admit: 2016-10-05 | Discharge: 2016-10-05 | Disposition: A | Discharge status: Home / Self Care | Attending: Emergency Medicine | Admitting: Emergency Medicine

## 2016-10-05 ENCOUNTER — Emergency Department (HOSPITAL_BASED_OUTPATIENT_CLINIC_OR_DEPARTMENT_OTHER): Admit: 2016-10-05 | Discharge: 2016-10-05 | Disposition: A | Discharge status: Home / Self Care

## 2016-10-05 ENCOUNTER — Encounter (HOSPITAL_COMMUNITY): Payer: Self-pay | Admitting: Emergency Medicine

## 2016-10-05 ENCOUNTER — Emergency Department (HOSPITAL_COMMUNITY)

## 2016-10-05 DIAGNOSIS — J45909 Unspecified asthma, uncomplicated: Secondary | ICD-10-CM | POA: Diagnosis not present

## 2016-10-05 DIAGNOSIS — R42 Dizziness and giddiness: Secondary | ICD-10-CM | POA: Insufficient documentation

## 2016-10-05 DIAGNOSIS — M79604 Pain in right leg: Secondary | ICD-10-CM | POA: Insufficient documentation

## 2016-10-05 DIAGNOSIS — Z7984 Long term (current) use of oral hypoglycemic drugs: Secondary | ICD-10-CM | POA: Diagnosis not present

## 2016-10-05 DIAGNOSIS — Z79899 Other long term (current) drug therapy: Secondary | ICD-10-CM | POA: Insufficient documentation

## 2016-10-05 DIAGNOSIS — M79609 Pain in unspecified limb: Secondary | ICD-10-CM | POA: Diagnosis not present

## 2016-10-05 DIAGNOSIS — E119 Type 2 diabetes mellitus without complications: Secondary | ICD-10-CM | POA: Diagnosis not present

## 2016-10-05 DIAGNOSIS — N390 Urinary tract infection, site not specified: Secondary | ICD-10-CM | POA: Diagnosis not present

## 2016-10-05 LAB — URINALYSIS, ROUTINE W REFLEX MICROSCOPIC
Bilirubin Urine: NEGATIVE
Glucose, UA: NEGATIVE mg/dL
HGB URINE DIPSTICK: NEGATIVE
Ketones, ur: NEGATIVE mg/dL
Nitrite: NEGATIVE
PROTEIN: NEGATIVE mg/dL
Specific Gravity, Urine: 1.006 (ref 1.005–1.030)
pH: 5 (ref 5.0–8.0)

## 2016-10-05 LAB — COMPREHENSIVE METABOLIC PANEL
ALT: 50 U/L (ref 14–54)
AST: 31 U/L (ref 15–41)
Albumin: 4 g/dL (ref 3.5–5.0)
Alkaline Phosphatase: 75 U/L (ref 38–126)
Anion gap: 8 (ref 5–15)
BILIRUBIN TOTAL: 0.4 mg/dL (ref 0.3–1.2)
BUN: 17 mg/dL (ref 6–20)
CALCIUM: 9.1 mg/dL (ref 8.9–10.3)
CO2: 22 mmol/L (ref 22–32)
CREATININE: 0.89 mg/dL (ref 0.44–1.00)
Chloride: 107 mmol/L (ref 101–111)
GFR calc Af Amer: >60 mL/min (ref >60)
Glucose, Bld: 150 mg/dL — ABNORMAL HIGH (ref 65–99)
Potassium: 3.8 mmol/L (ref 3.5–5.1)
Sodium: 137 mmol/L (ref 135–145)
TOTAL PROTEIN: 7.8 g/dL (ref 6.5–8.1)

## 2016-10-05 LAB — LIPASE, BLOOD: LIPASE: 48 U/L (ref 11–51)

## 2016-10-05 LAB — CBC
HCT: 39.5 % (ref 36.0–46.0)
HEMOGLOBIN: 13 g/dL (ref 12.0–15.0)
MCH: 28.2 pg (ref 26.0–34.0)
MCHC: 32.9 g/dL (ref 30.0–36.0)
MCV: 85.7 fL (ref 78.0–100.0)
Platelets: 252 10*3/uL (ref 150–400)
RBC: 4.61 MIL/uL (ref 3.87–5.11)
RDW: 14 % (ref 11.5–15.5)
WBC: 8.1 10*3/uL (ref 4.0–10.5)

## 2016-10-05 LAB — POC URINE PREG, ED: PREG TEST UR: NEGATIVE

## 2016-10-05 LAB — CBG MONITORING, ED: Glucose-Capillary: 152 mg/dL — ABNORMAL HIGH (ref 65–99)

## 2016-10-05 LAB — PROTIME-INR
INR: 1.02
Prothrombin Time: 13.5 seconds (ref 11.4–15.2)

## 2016-10-05 LAB — TROPONIN I: Troponin I: <0.03 ng/mL (ref <0.03)

## 2016-10-05 MED ORDER — NITROFURANTOIN MONOHYD MACRO 100 MG PO CAPS: 100.0000 mg | ORAL_CAPSULE | Freq: Two times a day (BID) | ORAL | 0 refills | Status: DC

## 2016-10-05 MED ORDER — ONDANSETRON 4 MG PO TBDP: 4.0000 mg | ORAL_TABLET | Freq: Three times a day (TID) | ORAL | 0 refills | Status: DC | PRN

## 2016-10-05 MED ORDER — SODIUM CHLORIDE 0.9 % IV BOLUS (SEPSIS)
1000.0000 mL | Freq: Once | INTRAVENOUS | Status: AC
  Administered 2016-10-05: 1000 mL via INTRAVENOUS

## 2016-10-05 MED ORDER — MECLIZINE HCL 25 MG PO TABS: 25.0000 mg | ORAL_TABLET | Freq: Three times a day (TID) | ORAL | 0 refills | Status: DC | PRN

## 2016-10-05 MED ORDER — MECLIZINE HCL 25 MG PO TABS
25.0000 mg | ORAL_TABLET | Freq: Once | ORAL | Status: AC
  Administered 2016-10-05: 25 mg via ORAL
  Filled 2016-10-05: qty 1

## 2016-10-05 NOTE — ED Notes (Signed)
Pt ambulatory and independent at discharge.  Verbalized understanding of discharge instructions 

## 2016-10-05 NOTE — Discharge Instructions (Signed)
Take Meclizine as prescribed for dizziness--caution may cause sedation --do not drink alcohol, drive, or operate machinery while taking. Take Zofran as prescribed as needed for nausea. Take Macrobid (antibiotic) as prescribed--take with food to prevent Gi upset. Call to schedule a follow up appointment with your primary care doctor. Return to ER if you experience fevers, unexplained weight loss, vision or gait changes, double vision, neck pain, chest pain, shortness of breath, abdominal pain, unable to tolerate food or fluids, extremity numbness/tingling/weakness, worsening symptoms, or any additional concerns.

## 2016-10-05 NOTE — Progress Notes (Signed)
*  PRELIMINARY RESULTS* Vascular Ultrasound Right lower extremity venous duplex has been completed.  Preliminary findings: No evidence of DVT or baker's cyst.  Farrel Demark, RDMS, RVT  10/05/2016, 2:10 PM

## 2016-10-05 NOTE — ED Provider Notes (Signed)
[facility]-EMERGENCY DEPT Provider Note   CSN: [id number] Arrival date & time: 10/05/16  1231     History   Chief Complaint Chief Complaint  Patient presents with  . Dizziness  . Leg Pain    HPI Josanne KHUSHBOO CHUCK is a 51 y.o. female.  Pt is a 51 y/o F with PMHx of asthma, T2DM, and gerd who presents to ED for dizziness, onset approx 3 days ago, describes sensation of room spinning, worse with position changes, with associated nausea. No recent URI symptoms. Also reports mild frontal headache, gradual onset, feels similar to previous, no sudden onset or thunderclap, no photophobia or phonophobia. Denies head trauma or LOC. No anticoag use. Also reports R upper thigh pain with palpation, denies recent trauma or known injury. No hx of DVT/PE. No recent prolonged immobilization or estrogen use. Denies fevers, chills, unexplained weight loss, vision or gait changes, diplopia, tinnitus, CP, SOB, cough, pleurisy, abd pain, vomiting, diarrhea, dysuria, hematuria, extremity numbness/tingling/weakness, or any additional concerns.       Past Medical History:  Diagnosis Date  . Asthma   . Complication of anesthesia   . Diabetes mellitus without complication (HCC)    Type II  . Dysrhythmia    Tachycardia  . GERD (gastroesophageal reflux disease)   . Pneumonia    2014- 'Walking"  . Shortness of breath dyspnea    with exertion    Patient Active Problem List   Diagnosis Date Noted  . Other fatigue 03/23/2016  . Insomnia 03/23/2016  . Nausea without vomiting 06/07/2015  . Type 2 diabetes mellitus without complication, with long-term current use of insulin (HCC) 06/07/2015  . GERD (gastroesophageal reflux disease) 06/07/2015  . Tachycardia 06/07/2015    Past Surgical History:  Procedure Laterality Date  . CHOLECYSTECTOMY  1997  . COLONOSCOPY  2015  . CYSTOSCOPY     age 51- stretched stem of bladder  . 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 SHOULDER ARTHROSCOPY Left 11/20/2014   Procedure:  ARTHROSCOPY SHOULDER WITH MUA, ROTATOR INTERVAL RELEASE;  Surgeon: Cammy Copa, MD;  Location: [facility] OR;  Service: Orthopedics;  Laterality: Left;  LEFT SHOULDER MUA, DOA, ROTATOR INTERVAL RELEASE.  . TONSILLECTOMY  2010    OB History    No data available       Home Medications    Prior to Admission medications   Medication Sig Start Date End Date Taking? Authorizing Provider  albuterol (PROVENTIL HFA;VENTOLIN HFA) 108 (90 Base) MCG/ACT inhaler Inhale 2 puffs into the lungs every 6 (six) hours as needed for wheezing or shortness of breath. 01/27/16  Yes Lorre Munroe, NP  BYSTOLIC 10 MG tablet TAKE 1 TABLET DAILY Patient taking differently: Take 1 tablet by mouth at bedtime 09/22/16  Yes Doreene Nest, NP  JANUVIA 100 MG tablet TAKE 1 TABLET DAILY Patient taking differently: Take 1 tablet by mouth at bedtime 04/01/16  Yes Doreene Nest, NP  medroxyPROGESTERone (DEPO-PROVERA) 150 MG/ML injection Inject 150 mg into the muscle every 3 (three) months.   Yes Historical Provider, MD  metFORMIN (GLUCOPHAGE) 1000 MG tablet Take 1 tablet (1,000 mg total) by mouth 2 (two) times daily with a meal. 04/17/16  Yes Doreene Nest, NP  naproxen sodium (ANAPROX) 220 MG tablet Take 440 mg by mouth 2 (two) times daily as needed (for pain).   Yes Historical Provider, MD  ondansetron (ZOFRAN-ODT) 8 MG disintegrating tablet Take 1 tablet (8 mg total) by mouth every 8 (eight) hours  as needed for nausea or vomiting. 06/12/16  Yes Lorre Munroe, NP  telmisartan (MICARDIS) 40 MG tablet Take 40 mg by mouth at bedtime.    Yes Historical Provider, MD  meclizine (ANTIVERT) 25 MG tablet Take 1 tablet (25 mg total) by mouth 3 (three) times daily as needed for dizziness. 10/05/16   Hinton Dyer Kalika Smay, NP  nitrofurantoin, macrocrystal-monohydrate, (MACROBID) 100 MG capsule Take 1 capsule (100 mg total) by mouth 2 (two) times daily. 10/05/16   Hinton Dyer Lakeith Careaga, NP  ondansetron (ZOFRAN ODT) 4 MG disintegrating tablet  Take 1 tablet (4 mg total) by mouth every 8 (eight) hours as needed for nausea or vomiting. 10/05/16   Albesa Seen, NP    Family History Family History  Problem Relation Age of Onset  . Adopted: Yes  . Other      adopted    Social History Social History  Substance Use Topics  . Smoking status: Never Smoker  . Smokeless tobacco: Never Used  . Alcohol use 0.0 oz/week     Comment: rare     Allergies   Amoxicillin; Ciprofloxacin; Erythromycin; Other; Penicillins; Sulfa antibiotics; and Vicodin [hydrocodone-acetaminophen]   Review of Systems Review of Systems  Constitutional: Negative for chills, fever and unexpected weight change.  HENT: Negative for congestion, ear pain, facial swelling, sore throat and tinnitus.   Eyes: Negative for photophobia, pain, redness and visual disturbance.  Respiratory: Negative for cough and shortness of breath.   Cardiovascular: Negative for chest pain, palpitations and leg swelling.  Gastrointestinal: Positive for nausea. Negative for abdominal pain, diarrhea and vomiting.  Genitourinary: Negative for dysuria, frequency and hematuria.  Musculoskeletal: Negative for back pain, neck pain and neck stiffness.       R thigh pain  Skin: Negative for rash.  Neurological: Positive for dizziness and headaches. Negative for weakness and numbness.     Physical Exam Updated Vital Signs BP 120/69 (BP Location: Right Arm)   Pulse 82   Temp 98 F (36.7 C) (Oral)   Resp 15   Ht 5' (1.524 m)   Wt 111.6 kg   SpO2 100%   BMI 48.04 kg/m   Physical Exam  Constitutional: She is oriented to person, place, and time. She appears well-developed and well-nourished.  HENT:  Head: Normocephalic and atraumatic. Head is without raccoon's eyes and without Battle's sign.  Right Ear: Tympanic membrane, external ear and ear canal normal. No middle ear effusion. No hemotympanum.  Left Ear: Tympanic membrane, external ear and ear canal normal.  No middle ear  effusion. No hemotympanum.  Nose: Nose normal. Right sinus exhibits no maxillary sinus tenderness and no frontal sinus tenderness. Left sinus exhibits no maxillary sinus tenderness and no frontal sinus tenderness.  Mouth/Throat: Uvula is midline, oropharynx is clear and moist and mucous membranes are normal.  Eyes: Conjunctivae and EOM are normal. Pupils are equal, round, and reactive to light. No scleral icterus.  Neck: Normal range of motion and full passive range of motion without pain. Neck supple. Carotid bruit is not present.  Able to actively rotate neck 45 degrees to L and R.   Cardiovascular: Normal rate, regular rhythm, normal heart sounds and normal pulses.  Exam reveals no gallop.   No murmur heard. Pulses:      Radial pulses are 2+ on the right side, and 2+ on the left side.       Dorsalis pedis pulses are 2+ on the right side, and 2+ on the left side.  Posterior tibial pulses are 2+ on the right side, and 2+ on the left side.  Pulmonary/Chest: Effort normal and breath sounds normal. She has no wheezes. She has no rhonchi. She has no rales.  Abdominal: Soft. Bowel sounds are normal. There is no tenderness.  Musculoskeletal: Normal range of motion.  +R medial thigh ttp, skin intact without erythema, ecchymosis or rash. Full active ROM to bilateral hips, knees, and ankles. Dorsalis pedis and posterior tibial pulses 2+, cap refill < 2sec. Sharp and dull sensation grossly intact to bilateral LE.   Neurological: She is alert and oriented to person, place, and time. She displays a negative Romberg sign. Gait normal.  Strength 5/5 to bilateral UE and LE. Speech clear, no facial asymmetry. Finger to nose intact. Gait steady with ambulation around room. +Epleys manuever  Skin: Skin is warm and dry.  Psychiatric: She has a normal mood and affect. Her speech is normal.  Nursing note and vitals reviewed.    ED Treatments / Results  Labs (all labs ordered are listed, but only abnormal  results are displayed) Labs Reviewed  URINE CULTURE - Abnormal; Notable for the following:       Result Value   Culture MULTIPLE SPECIES PRESENT, SUGGEST RECOLLECTION (*)    All other components within normal limits  COMPREHENSIVE METABOLIC PANEL - Abnormal; Notable for the following:    Glucose, Bld 150 (*)    All other components within normal limits  URINALYSIS, ROUTINE W REFLEX MICROSCOPIC - Abnormal; Notable for the following:    APPearance HAZY (*)    Leukocytes, UA LARGE (*)    Bacteria, UA RARE (*)    Squamous Epithelial / LPF 6-30 (*)    All other components within normal limits  CBG MONITORING, ED - Abnormal; Notable for the following:    Glucose-Capillary 152 (*)    All other components within normal limits  CBC  LIPASE, BLOOD  TROPONIN I  PROTIME-INR  POC URINE PREG, ED    EKG  EKG Interpretation  Date/Time:  Monday October 05 2016 13:28:10 EDT Ventricular Rate:  82 PR Interval:    QRS Duration: 78 QT Interval:  353 QTC Calculation: 413 R Axis:   16 Text Interpretation:  Sinus rhythm Low voltage, precordial leads Borderline T wave abnormalities Confirmed by Juleen China  MD, STEPHEN ([id number]) on 10/05/2016 2:04:42 PM       Radiology No results found.  Procedures Procedures (including critical care time)  Medications Ordered in ED Medications  sodium chloride 0.9 % bolus 1,000 mL (0 mLs Intravenous Stopped 10/05/16 1718)  meclizine (ANTIVERT) tablet 25 mg (25 mg Oral Given 10/05/16 1340)     Initial Impression / Assessment and Plan / ED Course  I have reviewed the triage vital signs and the nursing notes.  Pertinent labs & imaging results that were available during my care of the patient were reviewed by me and considered in my medical decision making (see chart for details).    Pt is a 51 y/o F who presents to ED for vertigo, likely peripheral in etiology. No acute neuro deficits. Doubt CVA, TIA, carotid or veterobasilar dissection, or cardiac dysrhythmia at  this time. Low suspicion for cerebellar or posterior circulation CVA/TIA/dissection given history and non-focal neurologic exam with no other associated neurologic symptoms. Will get labs for electrolyte abnormality/anemia, EKG for dysrhythmia, WPW, Brugada, Ischemia, long QT. CXR to assess for wide mediastinum and other cardiac abnormality. Given localized ttp to R thigh without trauma or known injury, will  get venous duplex to r/o DVT. Will also get orthostatic VS, hydrate, and re-eval.   2:31 PM Pt in u/s  2:50 PM Pt updated on results, UA pending. Will plan to ambulate and likely dc with PCP f/u; pt amenable to this plan.   3:53 PM Pt ambulated around pod with steady gait. No evidence of cerebellar dysfunction or other focal neurologic deficits on exam repeat neuro exam. Strict return precautions discussed, recommended follow up with PCP in next 24-48hrs.   3:55 PM UA with large leuks, will treat with macrobid and send for culture. Discussed results, discharge instructions, rx and safety, return precautions, and follow up. Pt verbalizes understanding using verbal teachback and agrees with plan, denies any additional concerns.    Final Clinical Impressions(s) / ED Diagnoses   Final diagnoses:  Vertigo  Right leg pain  Urinary tract infection without hematuria, site unspecified    New Prescriptions Discharge Medication List as of 10/05/2016  4:08 PM    START taking these medications   Details  meclizine (ANTIVERT) 25 MG tablet Take 1 tablet (25 mg total) by mouth 3 (three) times daily as needed for dizziness., Starting Mon 10/05/2016, Print    !! ondansetron (ZOFRAN ODT) 4 MG disintegrating tablet Take 1 tablet (4 mg total) by mouth every 8 (eight) hours as needed for nausea or vomiting., Starting Mon 10/05/2016, Print    nitrofurantoin, macrocrystal-monohydrate, (MACROBID) 100 MG capsule Take 1 capsule (100 mg total) by mouth 2 (two) times daily., Starting Mon 10/05/2016, Print      !! - Potential duplicate medications found. Please discuss with provider.       Albesa Seen, NP 10/07/16 [id number]    Raeford Razor, MD 10/08/16 1515

## 2016-10-05 NOTE — ED Notes (Signed)
Pt's CBG taken=152 

## 2016-10-05 NOTE — ED Triage Notes (Signed)
Patient reports dizziness x2 days. Reports slight intermittent headache, but denies chest pain/shortness of breath. Also reports knot on upper right extremity & pain to the area.

## 2016-10-07 LAB — URINE CULTURE

## 2016-10-13 LAB — HM DIABETES EYE EXAM

## 2016-11-02 ENCOUNTER — Encounter: Payer: Self-pay | Admitting: Family Medicine

## 2016-11-02 ENCOUNTER — Ambulatory Visit (INDEPENDENT_AMBULATORY_CARE_PROVIDER_SITE_OTHER): Admitting: Family Medicine

## 2016-11-02 VITALS — BP 120/88 | HR 70 | Temp 98.0°F | Ht 62.0 in | Wt 139.0 lb

## 2016-11-02 DIAGNOSIS — I889 Nonspecific lymphadenitis, unspecified: Secondary | ICD-10-CM | POA: Diagnosis not present

## 2016-11-02 DIAGNOSIS — R42 Dizziness and giddiness: Secondary | ICD-10-CM | POA: Insufficient documentation

## 2016-11-02 DIAGNOSIS — E119 Type 2 diabetes mellitus without complications: Secondary | ICD-10-CM | POA: Diagnosis not present

## 2016-11-02 LAB — HEMOGLOBIN A1C: Hgb A1c MFr Bld: 7.5 % — ABNORMAL HIGH (ref 4.6–6.5)

## 2016-11-02 MED ORDER — AMOXICILLIN-POT CLAVULANATE 875-125 MG PO TABS: 1.0000 | ORAL_TABLET | Freq: Two times a day (BID) | ORAL | 0 refills | Status: AC

## 2016-11-02 NOTE — Assessment & Plan Note (Signed)
Persistent. Refer to PT for vestibular rehabilitation. Ok to continue as needed meclizine. The patient indicates understanding of these issues and agrees with the plan.

## 2016-11-02 NOTE — Progress Notes (Signed)
Subjective:   Patient ID: Melissa [name], female    DOB: 04/20/1966, 51 y.o.   MRN: [id number]  Melissa [name] is a pleasant 51 y.o. year old female pt of Mayra Reel with h/o DM, GERD, asthma, new to me, who presents to clinic today with Cyst (BEHIND RIGHT EAR PT NOTICED IT ABOUT A WEEK AGO)  on 11/02/2016  HPI:  Was seen in ED for vertigo on 10/05/16. Note reviewed. At that time, had been experiencing 3 days of sensation of room spinning, worse with changes in head position and associated with nausea. No URI symptoms. Mild frontal headache, No photophobia.  Troponin, EKG, Lipase, CXR, CMET and CBC unremarkable. +Epley maneuver.  UA pos for large leg- treated with macrobid. Urine cx- probable contaminant.  Given IV fluid bolus and meclizine.  Symptoms improved.  Diagnosed with BPV.  She still is having dizziness with changes in head position. Meclizine only helps  For a few hours.  Also has a lump behind her right ear that is growing in size and becoming painful.   Current Outpatient Prescriptions on File Prior to Visit  Medication Sig Dispense Refill  . albuterol (PROVENTIL HFA;VENTOLIN HFA) 108 (90 Base) MCG/ACT inhaler Inhale 2 puffs into the lungs every 6 (six) hours as needed for wheezing or shortness of breath. 1 Inhaler 2  . BYSTOLIC 10 MG tablet TAKE 1 TABLET DAILY (Patient taking differently: Take 1 tablet by mouth at bedtime) 90 tablet 1  . JANUVIA 100 MG tablet TAKE 1 TABLET DAILY (Patient taking differently: Take 1 tablet by mouth at bedtime) 90 tablet 1  . meclizine (ANTIVERT) 25 MG tablet Take 1 tablet (25 mg total) by mouth 3 (three) times daily as needed for dizziness. 30 tablet 0  . medroxyPROGESTERone (DEPO-PROVERA) 150 MG/ML injection Inject 150 mg into the muscle every 3 (three) months.    . metFORMIN (GLUCOPHAGE) 1000 MG tablet Take 1 tablet (1,000 mg total) by mouth 2 (two) times daily with a meal. 180 tablet 3  . naproxen sodium (ANAPROX) 220 MG tablet Take 440 mg  by mouth 2 (two) times daily as needed (for pain).    . nitrofurantoin, macrocrystal-monohydrate, (MACROBID) 100 MG capsule Take 1 capsule (100 mg total) by mouth 2 (two) times daily. 14 capsule 0  . ondansetron (ZOFRAN ODT) 4 MG disintegrating tablet Take 1 tablet (4 mg total) by mouth every 8 (eight) hours as needed for nausea or vomiting. 12 tablet 0  . ondansetron (ZOFRAN-ODT) 8 MG disintegrating tablet Take 1 tablet (8 mg total) by mouth every 8 (eight) hours as needed for nausea or vomiting. 20 tablet 0  . telmisartan (MICARDIS) 40 MG tablet Take 40 mg by mouth at bedtime.      No current facility-administered medications on file prior to visit.     Allergies  Allergen Reactions  . Amoxicillin Anaphylaxis and Other (See Comments)    Has patient had a PCN reaction causing immediate rash, facial/tongue/throat swelling, SOB or lightheadedness with hypotension: Yes Has patient had a PCN reaction causing severe rash involving mucus membranes or skin necrosis: No Has patient had a PCN reaction that required hospitalization No Has patient had a PCN reaction occurring within the last 10 years: No If all of the above answers are "NO", then [name] proceed with Cephalosporin use.  . Ciprofloxacin Anaphylaxis  . Erythromycin Anaphylaxis  . Other Anaphylaxis    Pt states that she can only take Avelox, Levaquin, and Zithromax.    Marland Kitchen Penicillins  Anaphylaxis and Other (See Comments)    Has patient had a PCN reaction causing immediate rash, facial/tongue/throat swelling, SOB or lightheadedness with hypotension: Yes Has patient had a PCN reaction causing severe rash involving mucus membranes or skin necrosis: No Has patient had a PCN reaction that required hospitalization No Has patient had a PCN reaction occurring within the last 10 years: No If all of the above answers are "NO", then [name] proceed with Cephalosporin use.  . Sulfa Antibiotics Anaphylaxis  . Vicodin [Hydrocodone-Acetaminophen] Itching     Past Medical History:  Diagnosis Date  . Asthma   . Complication of anesthesia   . Diabetes mellitus without complication (HCC)    Type II  . Dysrhythmia    Tachycardia  . GERD (gastroesophageal reflux disease)   . Pneumonia    2014- 'Walking"  . Shortness of breath dyspnea    with exertion    Past Surgical History:  Procedure Laterality Date  . CHOLECYSTECTOMY  1997  . COLONOSCOPY  2015  . CYSTOSCOPY     age 48- stretched stem of bladder  . 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 SHOULDER ARTHROSCOPY Left 11/20/2014   Procedure: ARTHROSCOPY SHOULDER WITH MUA, ROTATOR INTERVAL RELEASE;  Surgeon: Cammy Copa, MD;  Location: [facility] OR;  Service: Orthopedics;  Laterality: Left;  LEFT SHOULDER MUA, DOA, ROTATOR INTERVAL RELEASE.  . TONSILLECTOMY  2010    Family History  Problem Relation Age of Onset  . Adopted: Yes  . Other Unknown        adopted    Social History   Social History  . Marital status: Married    Spouse name: N/A  . Number of children: N/A  . Years of education: N/A   Occupational History  . Not on file.   Social History Main Topics  . Smoking status: Never Smoker  . Smokeless tobacco: Never Used  . Alcohol use 0.0 oz/week     Comment: rare  . Drug use: No  . Sexual activity: Not on file   Other Topics Concern  . Not on file   Social History Narrative   Married.   1 daughter.   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    The PMH, PSH, Social History, Family History, Medications, and allergies have been reviewed in [facility], and have been updated if relevant.   Review of Systems  Constitutional: Negative for fever.  HENT: Negative for congestion, dental problem, facial swelling, hearing loss, sinus pressure, sneezing, sore throat, tinnitus, trouble swallowing and voice change.   Eyes: Negative.   Cardiovascular: Negative.   Gastrointestinal: Positive for nausea. Negative for vomiting.  Neurological: Positive for  dizziness. Negative for tremors, seizures, syncope, facial asymmetry, speech difficulty, weakness, light-headedness, numbness and headaches.  All other systems reviewed and are negative.      Objective:    BP 120/88   Pulse 70   Temp 98 F (36.7 C)   Ht 5\' 2"  (1.575 m)   Wt 139 lb (63 kg)   SpO2 98%   BMI 25.42 kg/m    Physical Exam  Constitutional: She is oriented to person, place, and time. She appears well-developed and well-nourished. No distress.  HENT:  Head: Normocephalic and atraumatic.  Eyes: Conjunctivae are normal.  Cardiovascular: Normal rate.   Pulmonary/Chest: Effort normal.  Musculoskeletal: Normal range of motion. She exhibits no edema.  Lymphadenopathy:       Head (right side): Posterior auricular  adenopathy present.  Neurological: She is alert and oriented to person, place, and time. No cranial nerve deficit.  Skin: Skin is warm and dry. She is not diaphoretic.  Psychiatric: She has a normal mood and affect. Her behavior is normal. Judgment and thought content normal.  Nursing note and vitals reviewed.         Assessment & Plan:   Lymphadenitis  Vertigo No Follow-up on file.

## 2016-11-02 NOTE — Progress Notes (Signed)
Pre visit review using our clinic review tool, if applicable. No additional management support is needed unless otherwise documented below in the visit note. 

## 2016-11-02 NOTE — Patient Instructions (Signed)
Great to see you.  Please take augmentin as directed.  I have referred you to vestibular rehab.

## 2016-11-02 NOTE — Assessment & Plan Note (Signed)
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 eRx sent for Augmentin. Call or return to clinic prn if these symptoms worsen or fail to improve as anticipated.  Orders Placed This Encounter  Procedures  . Hemoglobin A1c  . Ambulatory referral to Physical Therapy

## 2016-11-10 ENCOUNTER — Encounter: Payer: Self-pay | Admitting: Radiology

## 2016-11-10 ENCOUNTER — Encounter: Payer: Self-pay | Admitting: Primary Care

## 2016-11-10 ENCOUNTER — Ambulatory Visit (INDEPENDENT_AMBULATORY_CARE_PROVIDER_SITE_OTHER): Admitting: Primary Care

## 2016-11-10 VITALS — BP 110/66 | HR 82 | Temp 98.5°F | Ht 62.0 in | Wt 233.8 lb

## 2016-11-10 DIAGNOSIS — R000 Tachycardia, unspecified: Secondary | ICD-10-CM

## 2016-11-10 DIAGNOSIS — Z794 Long term (current) use of insulin: Secondary | ICD-10-CM | POA: Diagnosis not present

## 2016-11-10 DIAGNOSIS — G4700 Insomnia, unspecified: Secondary | ICD-10-CM

## 2016-11-10 DIAGNOSIS — I10 Essential (primary) hypertension: Secondary | ICD-10-CM | POA: Diagnosis not present

## 2016-11-10 DIAGNOSIS — E119 Type 2 diabetes mellitus without complications: Secondary | ICD-10-CM

## 2016-11-10 MED ORDER — NEBIVOLOL HCL 10 MG PO TABS: 10.0000 mg | ORAL_TABLET | Freq: Every day | ORAL | 3 refills | Status: DC

## 2016-11-10 MED ORDER — SITAGLIPTIN PHOS-METFORMIN HCL 50-1000 MG PO TABS: 1.0000 | ORAL_TABLET | Freq: Two times a day (BID) | ORAL | 1 refills | Status: DC

## 2016-11-10 MED ORDER — TELMISARTAN 40 MG PO TABS: 40.0000 mg | ORAL_TABLET | Freq: Every day | ORAL | 3 refills | Status: DC

## 2016-11-10 MED ORDER — ZOLPIDEM TARTRATE 5 MG PO TABS: 5.0000 mg | ORAL_TABLET | Freq: Every evening | ORAL | 0 refills | Status: DC | PRN

## 2016-11-10 NOTE — Assessment & Plan Note (Signed)
Doing well on Bystolic, continue same.

## 2016-11-10 NOTE — Patient Instructions (Signed)
Stop by the lab for the controlled substance contract and urine drug screen.  Try Ambien 5 mg tablets for insomnia. Please update me in several weeks as discussed.  Start janumet 50/1000 mg. Take 1 tablet by mouth twice daily for diabetes.   Schedule a lab only appointment in 3 months to recheck your A1C.  Follow up in 6 months for re-evaluation.  It was a pleasure to see you today!

## 2016-11-10 NOTE — Assessment & Plan Note (Signed)
Failed numerous OTC products, failed Trazodone. Given success with Ambien, will start with low dose of 5 mg to use PRN. She will update in 2-3 weeks. UDS and controlled substance contract obtained.

## 2016-11-10 NOTE — Assessment & Plan Note (Signed)
Recent increase in A1C, has not been taking meds. Has lost quite a bit of weight since dieting. Will have her take Janumet twice daily, everyday. Recheck A1C in three months. Strongly encouraged her to start exercising. Foot exam today unremarkable.

## 2016-11-10 NOTE — Progress Notes (Signed)
Subjective:    Patient ID: Melissa [name], female    DOB: January 04, 1966, 51 y.o.   MRN: [id number]  HPI  Melissa [name] is a 51 year old female who presents today for follow up.  1) Type 2 Diabetes: Currently managed on Januvia 100 mg, metformin 1000 mg. Also managed on telmisartan 40 mg for renal protection. Recent A1C of 7.5 which is a slight increase from 7.0 in August 2017.   She and her husband are on Herbal Life that they began in March 2018. She's also using My Fitness Pal for calorie counting. She's since lost 20+ pounds. She's not been taking much of her medication (Metformin/Januvia) on a daily basis. She's checking her sugars once weekly on average with numbers ranging 80-158's, 110's-120's.  Diet currently consists of:  Breakfast: Protein shake Lunch: Protein shake Dinner: Meat, wrap, taco salad Snacks: Protein shake, protein bar, cheese Desserts: None Beverages: Water, protein shakes, occasional diet pepsi  Exercise: She does not currently exercise   2) Tachycardia: Currently managed on Bystolic 10 mg. Her HR today is 82. She denies chest pain, palpitations, dizziness.   3) Insomnia: Long history of difficulty staying asleep. She will wake 3-5 times nightly. She tried Trazone 100 mg without improvement. She can't tolerate Melatonin given side effects of headaches. She's tried several other medications OTC. She's taken Ambien 10 mg for the past five days since having Lasik eye surgery and did very well. She's slept better over the past 5 days than she has in over 2 years.  She would like to try this medication at home.  Review of Systems  Constitutional: Negative for fatigue.  Respiratory: Negative for shortness of breath.   Cardiovascular: Negative for chest pain and palpitations.  Neurological: Negative for dizziness, numbness and headaches.  Psychiatric/Behavioral: Positive for sleep disturbance.       Past Medical History:  Diagnosis Date  . Asthma   . Complication of  anesthesia   . Diabetes mellitus without complication (HCC)    Type II  . Dysrhythmia    Tachycardia  . GERD (gastroesophageal reflux disease)   . Pneumonia    2014- 'Walking"  . Shortness of breath dyspnea    with exertion     Social History   Social History  . Marital status: Married    Spouse name: N/A  . Number of children: N/A  . Years of education: N/A   Occupational History  . Not on file.   Social History Main Topics  . Smoking status: Never Smoker  . Smokeless tobacco: Never Used  . Alcohol use 0.0 oz/week     Comment: rare  . Drug use: No  . Sexual activity: Not on file   Other Topics Concern  . Not on file   Social History Narrative   Married.   1 daughter.   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     Past Surgical History:  Procedure Laterality Date  . CHOLECYSTECTOMY  1997  . COLONOSCOPY  2015  . CYSTOSCOPY     age 84- stretched stem of bladder  . 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 SHOULDER ARTHROSCOPY Left 11/20/2014   Procedure: ARTHROSCOPY SHOULDER WITH MUA, ROTATOR INTERVAL RELEASE;  Surgeon: Cammy Copa, MD;  Location: [facility] OR;  Service: Orthopedics;  Laterality: Left;  LEFT SHOULDER MUA, DOA, ROTATOR INTERVAL RELEASE.  . TONSILLECTOMY  2010    Family History  Problem Relation Age  of Onset  . Adopted: Yes  . Other Unknown        adopted    Allergies  Allergen Reactions  . Amoxicillin Anaphylaxis and Other (See Comments)    Has patient had a PCN reaction causing immediate rash, facial/tongue/throat swelling, SOB or lightheadedness with hypotension: Yes Has patient had a PCN reaction causing severe rash involving mucus membranes or skin necrosis: No Has patient had a PCN reaction that required hospitalization No Has patient had a PCN reaction occurring within the last 10 years: No If all of the above answers are "NO", then [name] proceed with Cephalosporin use.  . Ciprofloxacin Anaphylaxis  . Erythromycin  Anaphylaxis  . Other Anaphylaxis    Pt states that she can only take Avelox, Levaquin, and Zithromax.    Marland Kitchen. Penicillins Anaphylaxis and Other (See Comments)    Has patient had a PCN reaction causing immediate rash, facial/tongue/throat swelling, SOB or lightheadedness with hypotension: Yes Has patient had a PCN reaction causing severe rash involving mucus membranes or skin necrosis: No Has patient had a PCN reaction that required hospitalization No Has patient had a PCN reaction occurring within the last 10 years: No If all of the above answers are "NO", then [name] proceed with Cephalosporin use.  . Sulfa Antibiotics Anaphylaxis  . Vicodin [Hydrocodone-Acetaminophen] Itching    Current Outpatient Prescriptions on File Prior to Visit  Medication Sig Dispense Refill  . albuterol (PROVENTIL HFA;VENTOLIN HFA) 108 (90 Base) MCG/ACT inhaler Inhale 2 puffs into the lungs every 6 (six) hours as needed for wheezing or shortness of breath. 1 Inhaler 2  . amoxicillin-clavulanate (AUGMENTIN) 875-125 MG tablet Take 1 tablet by mouth 2 (two) times daily. 20 tablet 0  . BYSTOLIC 10 MG tablet TAKE 1 TABLET DAILY (Patient taking differently: Take 1 tablet by mouth at bedtime) 90 tablet 1  . JANUVIA 100 MG tablet TAKE 1 TABLET DAILY (Patient taking differently: Take 1 tablet by mouth at bedtime) 90 tablet 1  . meclizine (ANTIVERT) 25 MG tablet Take 1 tablet (25 mg total) by mouth 3 (three) times daily as needed for dizziness. 30 tablet 0  . medroxyPROGESTERone (DEPO-PROVERA) 150 MG/ML injection Inject 150 mg into the muscle every 3 (three) months.    . metFORMIN (GLUCOPHAGE) 1000 MG tablet Take 1 tablet (1,000 mg total) by mouth 2 (two) times daily with a meal. 180 tablet 3  . naproxen sodium (ANAPROX) 220 MG tablet Take 440 mg by mouth 2 (two) times daily as needed (for pain).    . ondansetron (ZOFRAN-ODT) 8 MG disintegrating tablet Take 1 tablet (8 mg total) by mouth every 8 (eight) hours as needed for nausea or  vomiting. 20 tablet 0  . telmisartan (MICARDIS) 40 MG tablet Take 40 mg by mouth at bedtime.      No current facility-administered medications on file prior to visit.     BP 110/66 (BP Location: Right Arm, Patient Position: Sitting, Cuff Size: Large)   Pulse 82   Temp 98.5 F (36.9 C) (Oral)   Ht 5\' 2"  (1.575 m)   Wt 233 lb 12.8 oz (106.1 kg)   SpO2 97%   BMI 42.76 kg/m    Objective:   Physical Exam  Constitutional: She appears well-nourished.  Neck: Neck supple.  Cardiovascular: Normal rate and regular rhythm.   Pulmonary/Chest: Effort normal and breath sounds normal.  Skin: Skin is warm and dry.  Psychiatric: She has a normal mood and affect.  Assessment & Plan:

## 2016-11-11 ENCOUNTER — Encounter: Payer: Self-pay | Admitting: Primary Care

## 2016-11-24 ENCOUNTER — Encounter: Payer: Self-pay | Admitting: Primary Care

## 2016-12-07 ENCOUNTER — Other Ambulatory Visit: Payer: Self-pay | Admitting: Primary Care

## 2016-12-07 DIAGNOSIS — I10 Essential (primary) hypertension: Secondary | ICD-10-CM

## 2016-12-07 DIAGNOSIS — R000 Tachycardia, unspecified: Secondary | ICD-10-CM

## 2016-12-07 DIAGNOSIS — E119 Type 2 diabetes mellitus without complications: Secondary | ICD-10-CM

## 2016-12-07 MED ORDER — NEBIVOLOL HCL 10 MG PO TABS: 10.0000 mg | ORAL_TABLET | Freq: Every day | ORAL | 1 refills | Status: DC

## 2016-12-07 MED ORDER — SITAGLIPTIN PHOS-METFORMIN HCL 50-1000 MG PO TABS: 1.0000 | ORAL_TABLET | Freq: Two times a day (BID) | ORAL | 1 refills | Status: DC

## 2016-12-07 MED ORDER — TELMISARTAN 40 MG PO TABS: 40.0000 mg | ORAL_TABLET | Freq: Every day | ORAL | 1 refills | Status: DC

## 2016-12-14 ENCOUNTER — Other Ambulatory Visit: Payer: Self-pay | Admitting: *Deleted

## 2016-12-14 DIAGNOSIS — E119 Type 2 diabetes mellitus without complications: Secondary | ICD-10-CM

## 2016-12-14 DIAGNOSIS — G4700 Insomnia, unspecified: Secondary | ICD-10-CM

## 2016-12-14 MED ORDER — ZOLPIDEM TARTRATE 5 MG PO TABS: 5.0000 mg | ORAL_TABLET | Freq: Every evening | ORAL | 0 refills | Status: DC | PRN

## 2016-12-14 MED ORDER — SITAGLIPTIN PHOS-METFORMIN HCL 50-1000 MG PO TABS: 1.0000 | ORAL_TABLET | Freq: Two times a day (BID) | ORAL | 1 refills | Status: DC

## 2016-12-14 NOTE — Telephone Encounter (Signed)
Please advise for Ambien since Jae DireKate is out of the office.

## 2016-12-14 NOTE — Telephone Encounter (Signed)
Send the Janumet 50-1000 mg to [facility]

## 2016-12-14 NOTE — Telephone Encounter (Signed)
Patient left a voicemail stating that [facility] has sent over a request for a refill on her Janumet and they have not received it.   Patient stated that she also needs a refill on her Ambien Last refill 11/10/16 #30 Last office visit same date.

## 2016-12-14 NOTE — Telephone Encounter (Signed)
Printed.  Thanks.  

## 2016-12-15 NOTE — Telephone Encounter (Signed)
Message left for patient to return my call.  

## 2016-12-15 NOTE — Telephone Encounter (Signed)
Called in Ambien to the pharmacy as instructed. Shred hard copy.

## 2016-12-15 NOTE — Telephone Encounter (Signed)
Spoken to patient and she was notified Ambien called in

## 2017-04-23 ENCOUNTER — Other Ambulatory Visit: Payer: Self-pay | Admitting: Internal Medicine

## 2017-04-26 ENCOUNTER — Encounter: Payer: Self-pay | Admitting: Primary Care

## 2017-04-26 ENCOUNTER — Encounter: Payer: Self-pay | Admitting: Internal Medicine

## 2017-04-26 ENCOUNTER — Ambulatory Visit (INDEPENDENT_AMBULATORY_CARE_PROVIDER_SITE_OTHER): Admitting: Internal Medicine

## 2017-04-26 VITALS — BP 108/70 | HR 77 | Temp 98.6°F | Wt 216.0 lb

## 2017-04-26 DIAGNOSIS — R6889 Other general symptoms and signs: Secondary | ICD-10-CM

## 2017-04-26 DIAGNOSIS — R51 Headache: Secondary | ICD-10-CM

## 2017-04-26 DIAGNOSIS — R55 Syncope and collapse: Secondary | ICD-10-CM | POA: Diagnosis not present

## 2017-04-26 DIAGNOSIS — R519 Headache, unspecified: Secondary | ICD-10-CM

## 2017-04-26 MED ORDER — ONDANSETRON 8 MG PO TBDP: 8.0000 mg | ORAL_TABLET | Freq: Three times a day (TID) | ORAL | 0 refills | Status: DC | PRN

## 2017-04-26 NOTE — Assessment & Plan Note (Signed)
This doesn't seem pathologic discussed

## 2017-04-26 NOTE — Assessment & Plan Note (Signed)
Classic story and no worrisome heart or neuro findings EKG normal in April Discussed how to deal with dizziness like that (get down)

## 2017-04-26 NOTE — Telephone Encounter (Signed)
[facility] Please advise

## 2017-04-26 NOTE — Assessment & Plan Note (Signed)
Seems like sinus despite the lack of congestion, etc Discussed supportive care

## 2017-04-26 NOTE — Progress Notes (Signed)
Subjective:    Patient ID: Melissa [name], female    DOB: 12/24/1965, 51 y.o.   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 albuterol (PROVENTIL HFA;VENTOLIN HFA) 108 (90 Base) MCG/ACT inhaler Inhale 2 puffs into the lungs every 6 (six) hours as needed for wheezing or shortness of breath. 1 Inhaler 2  . meclizine (ANTIVERT) 25 MG tablet Take 1 tablet (25 mg total) by mouth 3 (three) times daily as needed for dizziness. 30 tablet 0  . medroxyPROGESTERone (DEPO-PROVERA) 150 MG/ML injection Inject 150 mg into the muscle every 3 (three) months.    . naproxen sodium (ANAPROX) 220 MG tablet Take 440 mg by mouth 2 (two) times daily as needed (for pain).    . nebivolol (BYSTOLIC) 10 MG tablet Take 1 tablet (10 mg total) by mouth at bedtime. 180 tablet 1  . ondansetron (ZOFRAN-ODT) 8 MG disintegrating tablet Take 1 tablet (8 mg total) by mouth every 8 (eight) hours as needed for nausea or vomiting. 20 tablet 0  . sitaGLIPtin-metformin (JANUMET) 50-1000 MG tablet Take 1 tablet by mouth 2 (two) times daily with a meal. 180 tablet 1  . telmisartan (MICARDIS) 40 MG tablet Take 1 tablet (40 mg total) by mouth at bedtime. 90 tablet 1  . zolpidem (AMBIEN) 5 MG tablet Take 1 tablet (5 mg total) by mouth at bedtime as needed for sleep. 30 tablet 0   No current facility-administered  medications on file prior to visit.     Allergies  Allergen Reactions  . Amoxicillin Anaphylaxis and Other (See Comments)    Has patient had a PCN reaction causing immediate rash, facial/tongue/throat swelling, SOB or lightheadedness with hypotension: Yes Has patient had a PCN reaction causing severe rash involving mucus membranes or skin necrosis: No Has patient had a PCN reaction that required hospitalization No Has patient had a PCN reaction occurring within the last 10 years: No If all of the above answers are "NO", then may proceed with Cephalosporin use.  . Ciprofloxacin Anaphylaxis  . Erythromycin Anaphylaxis  . Other Anaphylaxis    Pt states that she can only take Avelox, Levaquin, and Zithromax.    Marland Kitchen. Penicillins Anaphylaxis and Other (See Comments)    Has patient had a PCN reaction causing immediate rash, facial/tongue/throat swelling, SOB or lightheadedness with hypotension: Yes Has patient had a PCN reaction causing severe rash involving mucus membranes or skin necrosis: No Has patient had a PCN reaction that required hospitalization No Has patient had a PCN reaction occurring within the last 10 years: No If all of the above answers are "NO", then may proceed with Cephalosporin use.  . Sulfa Antibiotics Anaphylaxis  . Vicodin [Hydrocodone-Acetaminophen] Itching    Past Medical History:  Diagnosis Date  . Asthma   . Complication of anesthesia   . Diabetes mellitus without complication (HCC)  Type II  . Dysrhythmia    Tachycardia  . GERD (gastroesophageal reflux disease)   . Pneumonia    2014- 'Walking"  . Shortness of breath dyspnea    with exertion    Past Surgical History:  Procedure Laterality Date  . CHOLECYSTECTOMY  1997  . COLONOSCOPY  2015  . CYSTOSCOPY     age 27- stretched stem of bladder  . 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 TONSILLECTOMY  2010    Family History  Adopted: Yes  Problem Relation Age of Onset  . Other Unknown        adopted     Social History   Socioeconomic History  . Marital status: Married    Spouse name: Not on file  . Number of children: Not on file  . Years of education: Not on file  . Highest education level: Not on file  Social Needs  . Financial resource strain: Not on file  . Food insecurity - worry: Not on file  . Food insecurity - inability: Not on file  . Transportation needs - medical: Not on file  . Transportation needs - non-medical: Not on file  Occupational History  . Not on file  Tobacco Use  . Smoking status: Never Smoker  . Smokeless tobacco: Never Used  Substance and Sexual Activity  . Alcohol use: Yes    Alcohol/week: 0.0 oz    Comment: rare  . Drug use: No  . Sexual activity: Not on file  Other Topics Concern  . Not on file  Social History Narrative   Married.   1 daughter.   Works at [employer] .   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    Review of Systems Slight blurry vision of late Not sleeping well--relates to menopausal symptoms No focal weakness, dysphagia, aphasia    Objective:   Physical Exam  Constitutional: She is oriented to person, place, and time. No distress.  HENT:  Mouth/Throat: Oropharynx is clear and moist. No oropharyngeal exudate.  Shallow linear depressions on both sides of sagital sinus--doesn't feel pathologic  Eyes: Conjunctivae and EOM are normal. Pupils are equal, round, and reactive to light.  Discs sharp  Neck: No thyromegaly present.  Cardiovascular: Normal rate, regular rhythm and normal heart sounds. Exam reveals no gallop.  No murmur heard. Pulmonary/Chest: Effort normal and breath sounds normal. No respiratory distress. She has no wheezes. She has no rales.  Musculoskeletal: She exhibits no edema.  Lymphadenopathy:    She has no cervical adenopathy.  Neurological: She is alert and oriented to person, place, and time. She has normal strength. No cranial nerve deficit. She exhibits normal muscle tone. She displays a  negative Romberg sign. Coordination and gait normal.  Psychiatric: She has a normal mood and affect. Her behavior is normal.          Assessment & Plan:

## 2017-04-26 NOTE — Telephone Encounter (Signed)
Will check with patient to see what she's needing refilled.

## 2017-05-10 ENCOUNTER — Encounter: Payer: Self-pay | Admitting: Primary Care

## 2017-05-10 DIAGNOSIS — I10 Essential (primary) hypertension: Secondary | ICD-10-CM

## 2017-05-10 DIAGNOSIS — R000 Tachycardia, unspecified: Secondary | ICD-10-CM

## 2017-05-11 ENCOUNTER — Telehealth: Payer: Self-pay | Admitting: Primary Care

## 2017-05-11 ENCOUNTER — Encounter: Payer: Self-pay | Admitting: Primary Care

## 2017-05-11 MED ORDER — NEBIVOLOL HCL 10 MG PO TABS: 10.0000 mg | ORAL_TABLET | Freq: Every day | ORAL | 0 refills | Status: DC

## 2017-05-11 NOTE — Telephone Encounter (Signed)
Copied from CRM [id number]. Topic: Quick Communication - See Telephone Encounter >> May 11, 2017  8:41 AM Diana EvesHoyt, Maryann B wrote: CRM for notification. See Telephone encounter for:  Pt needing janumet adjusted and needs a refill on bustolic. Pt called this morning to make an appt today bc she was off but she was completely booked today and pt cant come any other time this week. She is only off on Mondays.  05/11/17.

## 2017-05-12 ENCOUNTER — Ambulatory Visit (INDEPENDENT_AMBULATORY_CARE_PROVIDER_SITE_OTHER): Admitting: Primary Care

## 2017-05-12 ENCOUNTER — Encounter: Payer: Self-pay | Admitting: Primary Care

## 2017-05-12 VITALS — BP 118/78 | HR 99 | Temp 98.2°F | Ht 62.0 in | Wt 219.1 lb

## 2017-05-12 DIAGNOSIS — E119 Type 2 diabetes mellitus without complications: Secondary | ICD-10-CM | POA: Diagnosis not present

## 2017-05-12 DIAGNOSIS — R000 Tachycardia, unspecified: Secondary | ICD-10-CM | POA: Diagnosis not present

## 2017-05-12 DIAGNOSIS — Z23 Encounter for immunization: Secondary | ICD-10-CM

## 2017-05-12 DIAGNOSIS — G4700 Insomnia, unspecified: Secondary | ICD-10-CM | POA: Diagnosis not present

## 2017-05-12 DIAGNOSIS — Z794 Long term (current) use of insulin: Secondary | ICD-10-CM | POA: Diagnosis not present

## 2017-05-12 LAB — LIPID PANEL
CHOLESTEROL: 205 mg/dL — AB (ref 0–200)
HDL: 50.2 mg/dL (ref >39.00)
LDL CALC: 122 mg/dL — AB (ref 0–99)
NonHDL: 155.13
TRIGLYCERIDES: 166 mg/dL — AB (ref 0.0–149.0)
Total CHOL/HDL Ratio: 4
VLDL: 33.2 mg/dL (ref 0.0–40.0)

## 2017-05-12 LAB — HEMOGLOBIN A1C: HEMOGLOBIN A1C: 7.4 % — AB (ref 4.6–6.5)

## 2017-05-12 MED ORDER — ZOLPIDEM TARTRATE 10 MG PO TABS: 10.0000 mg | ORAL_TABLET | Freq: Every evening | ORAL | 0 refills | Status: DC | PRN

## 2017-05-12 NOTE — Telephone Encounter (Signed)
Left message for patient to call back to discuss her medication needs and to see if we can help her with an appointment.

## 2017-05-12 NOTE — Addendum Note (Signed)
Addended by: Tawnya CrookSAMBATH, Samiel Peel on: 05/12/2017 12:01 PM   Modules accepted: Orders

## 2017-05-12 NOTE — Assessment & Plan Note (Signed)
Not doing well on Ambien at 5 mg. Previously did well on the 10 mg dose. Rx provided today, she will update.

## 2017-05-12 NOTE — Progress Notes (Signed)
Subjective:    Patient ID: Melissa [name], female    DOB: 01/06/1966, 51 y.o.   MRN: [id number]  HPI  [name] is a 51 year old female who presents today for follow up.  1) Essential Hypertension/Tachycardia: Currently managed on Bystolic 10 mg and telmisartan 40 mg. She does very well on this regimen, feels that tachycardia is under control.   BP Readings from Last 3 Encounters:  05/12/17 118/78  04/26/17 108/70  11/10/16 110/66     2) Type 2 Diabetes:   Current medications include: Sitagliptan-metformin 50-1000 mg BID.   She is checking her blood glucose once weekly and is getting readings of: AM Fasting: 140's-150's Before meals in the afternoon: 180's-190's.  Last A1C: 7.5 in May 2018 Last Eye Exam: Completed in May 2018 Last Foot Exam: Completed today Pneumonia Vaccination: Due today. ACE/ARB: ARB Statin: None. Lipid panel due today.   Diet currently consists of:  Breakfast: Fast food now. Typically does protein shake. Lunch: Fast food now. Typically does protein shake.  Dinner: Fast food now. Typically did meat, vegetable. Snacks: Popcorn, cookies, chips Desserts: More sweets recently. Typically doesn't eat sweets.  Beverages: Diet pepsi, water  Exercise: She is not exercising.       Review of Systems  Constitutional: Negative for fatigue.  Eyes: Negative for visual disturbance.  Respiratory: Negative for shortness of breath.   Cardiovascular: Negative for chest pain.  Neurological: Negative for dizziness and headaches.       Past Medical History:  Diagnosis Date  . Asthma   . Complication of anesthesia   . Diabetes mellitus without complication (HCC)    Type II  . Dysrhythmia    Tachycardia  . GERD (gastroesophageal reflux disease)   . Pneumonia    2014- 'Walking"  . Shortness of breath dyspnea    with exertion     Social History   Socioeconomic History  . Marital status: Married    Spouse name: Not on file  . Number of children: Not  on file  . Years of education: Not on file  . Highest education level: Not on file  Social Needs  . Financial resource strain: Not on file  . Food insecurity - worry: Not on file  . Food insecurity - inability: Not on file  . Transportation needs - medical: Not on file  . Transportation needs - non-medical: Not on file  Occupational History  . Not on file  Tobacco Use  . Smoking status: Never Smoker  . Smokeless tobacco: Never Used  Substance and Sexual Activity  . Alcohol use: Yes    Alcohol/week: 0.0 oz    Comment: rare  . Drug use: No  . Sexual activity: Not on file  Other Topics Concern  . Not on file  Social History Narrative   Married.   1 daughter.   Works at [name].   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     Past Surgical History:  Procedure Laterality Date  . CHOLECYSTECTOMY  1997  . COLONOSCOPY  2015  . CYSTOSCOPY     age 66- stretched stem of bladder  . 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 SHOULDER ARTHROSCOPY Left 11/20/2014   Procedure: ARTHROSCOPY SHOULDER WITH MUA, ROTATOR INTERVAL RELEASE;  Surgeon: Melissa CopaScott Gregory Dean, MD;  Location: [facility] OR;  Service: Orthopedics;  Laterality: Left;  LEFT SHOULDER MUA, DOA, ROTATOR INTERVAL RELEASE.  . TONSILLECTOMY  2010    Family History  Adopted:  Yes  Problem Relation Age of Onset  . Other Unknown        adopted    Allergies  Allergen Reactions  . Amoxicillin Anaphylaxis and Other (See Comments)    Has patient had a PCN reaction causing immediate rash, facial/tongue/throat swelling, SOB or lightheadedness with hypotension: Yes Has patient had a PCN reaction causing severe rash involving mucus membranes or skin necrosis: No Has patient had a PCN reaction that required hospitalization No Has patient had a PCN reaction occurring within the last 10 years: No If all of the above answers are "NO", then may proceed with Cephalosporin use.  . Ciprofloxacin Anaphylaxis  . Erythromycin Anaphylaxis  . Other  Anaphylaxis    Pt states that she can only take Avelox, Levaquin, and Zithromax.    Marland Kitchen. Penicillins Anaphylaxis and Other (See Comments)    Has patient had a PCN reaction causing immediate rash, facial/tongue/throat swelling, SOB or lightheadedness with hypotension: Yes Has patient had a PCN reaction causing severe rash involving mucus membranes or skin necrosis: No Has patient had a PCN reaction that required hospitalization No Has patient had a PCN reaction occurring within the last 10 years: No If all of the above answers are "NO", then may proceed with Cephalosporin use.  . Sulfa Antibiotics Anaphylaxis  . Vicodin [Hydrocodone-Acetaminophen] Itching    Current Outpatient Medications on File Prior to Visit  Medication Sig Dispense Refill  . albuterol (PROVENTIL HFA;VENTOLIN HFA) 108 (90 Base) MCG/ACT inhaler Inhale 2 puffs into the lungs every 6 (six) hours as needed for wheezing or shortness of breath. 1 Inhaler 2  . meclizine (ANTIVERT) 25 MG tablet Take 1 tablet (25 mg total) by mouth 3 (three) times daily as needed for dizziness. 30 tablet 0  . medroxyPROGESTERone (DEPO-PROVERA) 150 MG/ML injection Inject 150 mg into the muscle every 3 (three) months.    . naproxen sodium (ANAPROX) 220 MG tablet Take 440 mg by mouth 2 (two) times daily as needed (for pain).    . nebivolol (BYSTOLIC) 10 MG tablet Take 1 tablet (10 mg total) by mouth at bedtime. 90 tablet 0  . ondansetron (ZOFRAN-ODT) 8 MG disintegrating tablet Take 1 tablet (8 mg total) every 8 (eight) hours as needed by mouth for nausea or vomiting. 20 tablet 0  . sitaGLIPtin-metformin (JANUMET) 50-1000 MG tablet Take 1 tablet by mouth 2 (two) times daily with a meal. 180 tablet 1  . telmisartan (MICARDIS) 40 MG tablet Take 1 tablet (40 mg total) by mouth at bedtime. 90 tablet 1   No current facility-administered medications on file prior to visit.     BP 118/78   Pulse 99   Temp 98.2 F (36.8 C) (Oral)   Ht 5\' 2"  (1.575 m)   Wt  219 lb 1.9 oz (99.4 kg)   SpO2 99%   BMI 40.08 kg/m    Objective:   Physical Exam  Constitutional: She appears well-nourished.  Neck: Neck supple.  Cardiovascular: Normal rate and regular rhythm.  Pulmonary/Chest: Effort normal and breath sounds normal.  Skin: Skin is warm and dry.  Psychiatric: She has a normal mood and affect.          Assessment & Plan:

## 2017-05-12 NOTE — Patient Instructions (Signed)
We've increased your Ambien to 10 mg. Please update me if you'd like to continue at this dose.  Complete lab work prior to leaving today. I will notify you of your results once received.   Get back on track with your diet! Start exercising. You should be getting 150 minutes of moderate intensity exercise weekly.  Schedule a follow up visit in 6 months.   It was a pleasure to see you today!

## 2017-05-12 NOTE — Telephone Encounter (Signed)
Patient contacted office by E-mail and has appointment today.

## 2017-05-12 NOTE — Assessment & Plan Note (Signed)
Doing well on Bystolic, refills sent to pharmacy.

## 2017-05-12 NOTE — Assessment & Plan Note (Addendum)
Due for repeat A1C today. Continue Janumet for now, consider adding Glipizide. Strongly encouraged her to get back on track with weight loss through a healthy diet and exercise. Foot exam today unremarkable. Lipids pending, not managed on statin. Pneumonia vaccination provided today. Managed on ARB. Follow up in 6 weeks pending A1C results.

## 2017-05-13 ENCOUNTER — Other Ambulatory Visit: Payer: Self-pay | Admitting: Primary Care

## 2017-05-13 DIAGNOSIS — R000 Tachycardia, unspecified: Secondary | ICD-10-CM

## 2017-05-13 DIAGNOSIS — I10 Essential (primary) hypertension: Secondary | ICD-10-CM

## 2017-06-04 ENCOUNTER — Other Ambulatory Visit: Payer: Self-pay | Admitting: Primary Care

## 2017-06-04 DIAGNOSIS — G4700 Insomnia, unspecified: Secondary | ICD-10-CM

## 2017-06-04 MED ORDER — ZOLPIDEM TARTRATE 10 MG PO TABS: 10.0000 mg | ORAL_TABLET | Freq: Every evening | ORAL | 0 refills | Status: DC | PRN

## 2017-06-04 NOTE — Telephone Encounter (Incomplete)
Copied from CRM [phone]. Topic: Quick Communication - Rx Refill/Question >> Jun 04, 2017 10:30 AM Landry MellowFoltz, Melissa J wrote: Has the patient contacted their pharmacy? No new rx   (Agent: If no, request that the patient contact the pharmacy for the refill.)  Preferred Pharmacy (with phone number or street name):  Pt needs refill on ambien, [facility]  Pt was told to call in and let us know if it is working and it is.  cb number is [phone]   Agent: Please be advised that RX refills may take up to 3 business days. We ask that you follow-up with your pharmacy.

## 2017-06-04 NOTE — Telephone Encounter (Signed)
[facility] Pt last seen and ambien filled # 30 on 05/12/17.Please advise.

## 2017-06-04 NOTE — Telephone Encounter (Signed)
Ambien refill. Last OV and refill on 05/12/17.

## 2017-06-04 NOTE — Telephone Encounter (Signed)
Okay to phone in to pharmacy.  #90, no refills.

## 2017-06-07 MED ORDER — ZOLPIDEM TARTRATE 10 MG PO TABS: 10.0000 mg | ORAL_TABLET | Freq: Every evening | ORAL | 0 refills | Status: DC | PRN

## 2017-06-07 NOTE — Telephone Encounter (Signed)
Faxed Rx to mail order pharmacy.

## 2017-07-02 ENCOUNTER — Other Ambulatory Visit: Payer: Self-pay | Admitting: Primary Care

## 2017-07-02 DIAGNOSIS — E119 Type 2 diabetes mellitus without complications: Secondary | ICD-10-CM

## 2017-09-10 ENCOUNTER — Other Ambulatory Visit: Payer: Self-pay | Admitting: Primary Care

## 2017-09-10 DIAGNOSIS — G4700 Insomnia, unspecified: Secondary | ICD-10-CM

## 2017-09-10 MED ORDER — ZOLPIDEM TARTRATE 10 MG PO TABS: 10.0000 mg | ORAL_TABLET | Freq: Every evening | ORAL | 0 refills | Status: DC | PRN

## 2017-09-10 NOTE — Telephone Encounter (Signed)
Copied from CRM [phone]. Topic: Quick Communication - Rx Refill/Question >> Sep 10, 2017  3:41 PM Gloriann LoanPayne, Zamyiah Tino L wrote: Medication: zolpidem (AMBIEN) 10 MG tablet (Expired) Has the patient contacted their pharmacy? No. The provider told her to call office whenever she need it because it was expired (Agent: If no, request that the patient contact the pharmacy for the refill.) Preferred Pharmacy (with phone number or street name): [facility] [place] [address] [phone] (Phone) [phone] (Fax)     Agent: Please be advised that RX refills may take up to 3 business days. We ask that you follow-up with your pharmacy.

## 2017-09-10 NOTE — Telephone Encounter (Signed)
Requesting refill ambien;Pt was seen 05/12/17 and last refilled # 90 on 06/07/17.

## 2017-09-10 NOTE — Telephone Encounter (Signed)
Refill  Request  Ambien  Last Filled   06/07/2017 LOV  05/12/2017   Pharm[facility]  [facility]ed  [facility]  [place]  [place] [address]  [phone]

## 2017-09-10 NOTE — Telephone Encounter (Signed)
Refill sent to pharmacy.   

## 2017-09-13 ENCOUNTER — Other Ambulatory Visit: Payer: Self-pay | Admitting: Primary Care

## 2017-09-13 DIAGNOSIS — Z20828 Contact with and (suspected) exposure to other viral communicable diseases: Secondary | ICD-10-CM

## 2017-09-13 MED ORDER — OSELTAMIVIR PHOSPHATE 75 MG PO CAPS: 75.0000 mg | ORAL_CAPSULE | Freq: Every day | ORAL | 0 refills | Status: DC

## 2017-09-14 ENCOUNTER — Encounter: Payer: Self-pay | Admitting: Primary Care

## 2017-10-31 ENCOUNTER — Emergency Department
Admission: EM | Admit: 2017-10-31 | Discharge: 2017-10-31 | Disposition: A | Discharge status: Home / Self Care | Attending: Emergency Medicine | Admitting: Emergency Medicine

## 2017-10-31 ENCOUNTER — Encounter: Payer: Self-pay | Admitting: Radiology

## 2017-10-31 ENCOUNTER — Emergency Department

## 2017-10-31 ENCOUNTER — Other Ambulatory Visit: Payer: Self-pay

## 2017-10-31 DIAGNOSIS — Z7984 Long term (current) use of oral hypoglycemic drugs: Secondary | ICD-10-CM | POA: Insufficient documentation

## 2017-10-31 DIAGNOSIS — J45909 Unspecified asthma, uncomplicated: Secondary | ICD-10-CM | POA: Insufficient documentation

## 2017-10-31 DIAGNOSIS — Z79899 Other long term (current) drug therapy: Secondary | ICD-10-CM | POA: Insufficient documentation

## 2017-10-31 DIAGNOSIS — E1165 Type 2 diabetes mellitus with hyperglycemia: Secondary | ICD-10-CM | POA: Insufficient documentation

## 2017-10-31 DIAGNOSIS — R739 Hyperglycemia, unspecified: Secondary | ICD-10-CM

## 2017-10-31 DIAGNOSIS — R079 Chest pain, unspecified: Secondary | ICD-10-CM | POA: Diagnosis not present

## 2017-10-31 LAB — CBC
HCT: 39.2 % (ref 35.0–47.0)
Hemoglobin: 13.5 g/dL (ref 12.0–16.0)
MCH: 30.3 pg (ref 26.0–34.0)
MCHC: 34.4 g/dL (ref 32.0–36.0)
MCV: 88.1 fL (ref 80.0–100.0)
Platelets: 225 10*3/uL (ref 150–440)
RBC: 4.45 MIL/uL (ref 3.80–5.20)
RDW: 14 % (ref 11.5–14.5)
WBC: 8.2 10*3/uL (ref 3.6–11.0)

## 2017-10-31 LAB — BASIC METABOLIC PANEL
Anion gap: 10 (ref 5–15)
BUN: 14 mg/dL (ref 6–20)
CALCIUM: 8.8 mg/dL — AB (ref 8.9–10.3)
CO2: 20 mmol/L — ABNORMAL LOW (ref 22–32)
CREATININE: 0.81 mg/dL (ref 0.44–1.00)
Chloride: 103 mmol/L (ref 101–111)
Glucose, Bld: 302 mg/dL — ABNORMAL HIGH (ref 65–99)
Potassium: 3.8 mmol/L (ref 3.5–5.1)
SODIUM: 133 mmol/L — AB (ref 135–145)

## 2017-10-31 LAB — FIBRIN DERIVATIVES D-DIMER (ARMC ONLY): FIBRIN DERIVATIVES D-DIMER (ARMC): 550.88 ng{FEU}/mL — AB (ref 0.00–499.00)

## 2017-10-31 LAB — TROPONIN I

## 2017-10-31 LAB — POCT PREGNANCY, URINE: Preg Test, Ur: NEGATIVE

## 2017-10-31 LAB — GLUCOSE, CAPILLARY: Glucose-Capillary: 231 mg/dL — ABNORMAL HIGH (ref 65–99)

## 2017-10-31 MED ORDER — ASPIRIN 81 MG PO CHEW
324.0000 mg | CHEWABLE_TABLET | Freq: Once | ORAL | Status: AC
Start: 2017-10-31 — End: 2017-10-31
  Administered 2017-10-31: 324 mg via ORAL
  Filled 2017-10-31: qty 4

## 2017-10-31 MED ORDER — IOPAMIDOL (ISOVUE-370) INJECTION 76%
75.0000 mL | Freq: Once | INTRAVENOUS | Status: AC | PRN
  Administered 2017-10-31: 75 mL via INTRAVENOUS

## 2017-10-31 MED ORDER — SODIUM CHLORIDE 0.9 % IV BOLUS
1000.0000 mL | Freq: Once | INTRAVENOUS | Status: AC
  Administered 2017-10-31: 1000 mL via INTRAVENOUS

## 2017-10-31 NOTE — ED Notes (Signed)
Pt given ice water at this time, per ok via Dr Don Perking.

## 2017-10-31 NOTE — ED Notes (Signed)
Patient transported to CT at this time. 

## 2017-10-31 NOTE — ED Notes (Signed)
Pt taken to xray.   Urine specimen sent to lab in case UA is ordered.

## 2017-10-31 NOTE — ED Triage Notes (Signed)
Pt states that she had an episode of discomfort in her chest last night and again today, pt states that today she started with chest pain again with nausea and sweating. Pt reports that she is diabetic and takes medication for tachycardia

## 2017-10-31 NOTE — ED Notes (Signed)
Pt states last night while sitting still started having CP just L of sternum. This morning had same pain while driving. This afternoon pain began again but associated this time with nausea and diaphoresis. Pt states hx tachycardia, on meds for it. Denies hx of SVT or irregular heart beat. Alert, oriented, ambulatory. Speaking in complete sentences. No distress noted at this time. Denies any added stressors in her life recently.

## 2017-10-31 NOTE — Discharge Instructions (Addendum)

## 2017-10-31 NOTE — ED Notes (Signed)
Pt called out stating that she was having CP in same spot (just left of sternum). No associating symptoms this time.  NSR on monitor. No changes in rhythm. No tachycardia noted. Pt appears anxious d/t increase in RR but otherwise no distress noted.

## 2017-10-31 NOTE — ED Provider Notes (Signed)
[facility] Emergency Department Provider Note  ____________________________________________  Time seen: Approximately 6:48 PM  I have reviewed the triage vital signs and the nursing notes.   HISTORY  Chief Complaint Chest Pain   HPI Melissa [name] is a 52 y.o. female with a history of tachycardia, diabetes, asthma who presents for evaluation of chest pain.  Patient reports 3 episodes of chest pain starting yesterday.  All of him have been at rest.  She reports a sudden onset of moderate in intensity sharp pain located in the left side of her chest that lasts 10 to 15 minutes and resolved without intervention.  The third episode she had today while driving lasted about 30 minutes and was associated with nausea and diaphoresis which prompted her visit to the emergency room.  She is adopted and does not know her family history.  She does not have any personal history of heart attacks or blood clots.  At this time she has no chest pain.  Patient takes Depo shots for birth control. She does report mild L ankle swelling that started this am.  No URI symptoms, no vomiting, no abdominal pain, no fever chills.   Past Medical History:  Diagnosis Date  . Asthma   . Complication of anesthesia   . Diabetes mellitus without complication (HCC)    Type II  . Dysrhythmia    Tachycardia  . GERD (gastroesophageal reflux disease)   . Pneumonia    2014- 'Walking"  . Shortness of breath dyspnea    with exertion    Patient Active Problem List   Diagnosis Date Noted  . Abnormal findings on examination of skull and head 04/26/2017  . Vasovagal syncope 04/26/2017  . Headache 04/26/2017  . Lymphadenitis 11/02/2016  . Vertigo 11/02/2016  . Other fatigue 03/23/2016  . Insomnia 03/23/2016  . Nausea without vomiting 06/07/2015  . Type 2 diabetes mellitus without complication, with long-term current use of insulin (HCC) 06/07/2015  . GERD (gastroesophageal reflux disease)  06/07/2015  . Tachycardia 06/07/2015    Past Surgical History:  Procedure Laterality Date  . CHOLECYSTECTOMY  1997  . COLONOSCOPY  2015  . CYSTOSCOPY     age 50- stretched stem of bladder  . 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 SHOULDER ARTHROSCOPY Left 11/20/2014   Procedure: ARTHROSCOPY SHOULDER WITH MUA, ROTATOR INTERVAL RELEASE;  Surgeon: Cammy Copa, MD;  Location: [facility] OR;  Service: Orthopedics;  Laterality: Left;  LEFT SHOULDER MUA, DOA, ROTATOR INTERVAL RELEASE.  . TONSILLECTOMY  2010    Prior to Admission medications   Medication Sig Start Date End Date Taking? Authorizing Provider  albuterol (PROVENTIL HFA;VENTOLIN HFA) 108 (90 Base) MCG/ACT inhaler Inhale 2 puffs into the lungs every 6 (six) hours as needed for wheezing or shortness of breath. 01/27/16   Lorre Munroe, NP  BYSTOLIC 10 MG tablet TAKE 1 TABLET DAILY 05/13/17   Doreene Nest, NP  JANUMET 50-1000 MG tablet TAKE 1 TABLET TWICE A DAY WITH MEALS 07/02/17   Doreene Nest, NP  meclizine (ANTIVERT) 25 MG tablet Take 1 tablet (25 mg total) by mouth 3 (three) times daily as needed for dizziness. 10/05/16   Wojeck, Hinton Dyer, NP  medroxyPROGESTERone (DEPO-PROVERA) 150 MG/ML injection Inject 150 mg into the muscle every 3 (three) months.    [provider]  naproxen sodium (ANAPROX) 220 MG tablet Take 440 mg by mouth 2 (two) times daily as needed (for pain).  [provider]  ondansetron (ZOFRAN-ODT) 8 MG disintegrating tablet Take 1 tablet (8 mg total) every 8 (eight) hours as needed by mouth for nausea or vomiting. 04/26/17   Karie Schwalbe, MD  oseltamivir (TAMIFLU) 75 MG capsule Take 1 capsule (75 mg total) by mouth daily. 09/13/17   Doreene Nest, NP  telmisartan (MICARDIS) 40 MG tablet Take 1 tablet (40 mg total) by mouth at bedtime. 12/07/16   Doreene Nest, NP  zolpidem (AMBIEN) 10 MG tablet Take 1 tablet (10 mg total) by mouth at bedtime as needed for sleep. 09/10/17 10/10/17   Doreene Nest, NP    Allergies Amoxicillin; Ciprofloxacin; Erythromycin; Other; Penicillins; Sulfa antibiotics; and Vicodin [hydrocodone-acetaminophen]  Family History  Adopted: Yes  Problem Relation Age of Onset  . Other Unknown        adopted    Social History Social History   Tobacco Use  . Smoking status: Never Smoker  . Smokeless tobacco: Never Used  Substance Use Topics  . Alcohol use: Yes    Alcohol/week: 0.0 oz    Comment: rare  . Drug use: No    Review of Systems  Constitutional: Negative for fever. Eyes: Negative for visual changes. ENT: Negative for sore throat. Neck: No neck pain  Cardiovascular: + chest pain. Respiratory: Negative for shortness of breath.  Gastrointestinal: Negative for abdominal pain, vomiting or diarrhea. + nausea Genitourinary: Negative for dysuria. Musculoskeletal: Negative for back pain. Skin: Negative for rash. Neurological: Negative for headaches, weakness or numbness. Psych: No SI or HI  ____________________________________________   PHYSICAL EXAM:  VITAL SIGNS: ED Triage Vitals  Enc Vitals Group     BP 10/31/17 1729 136/80     Pulse Rate 10/31/17 1729 86     Resp 10/31/17 1729 16     Temp 10/31/17 1729 98.4 F (36.9 C)     Temp Source 10/31/17 1729 Oral     SpO2 10/31/17 1729 99 %     Weight 10/31/17 1730 240 lb (108.9 kg)     Height 10/31/17 1730 5' (1.524 m)     Head Circumference --      Peak Flow --      Pain Score 10/31/17 1730 2     Pain Loc --      Pain Edu? --      Excl. in GC? --     Constitutional: Alert and oriented. Well appearing and in no apparent distress. HEENT:      Head: Normocephalic and atraumatic.         Eyes: Conjunctivae are normal. Sclera is non-icteric.       Mouth/Throat: Mucous membranes are moist.       Neck: Supple with no signs of meningismus. Cardiovascular: Regular rate and rhythm. No murmurs, gallops, or rubs. 2+ symmetrical distal pulses are present in all  extremities. No JVD. Respiratory: Normal respiratory effort. Lungs are clear to auscultation bilaterally. No wheezes, crackles, or rhonchi.  Gastrointestinal: Soft, non tender, and non distended with positive bowel sounds. No rebound or guarding. Musculoskeletal: Nontender with normal range of motion in all extremities. No edema, cyanosis, or erythema of extremities. Neurologic: Normal speech and language. Face is symmetric. Moving all extremities. No gross focal neurologic deficits are appreciated. Skin: Skin is warm, dry and intact. No rash noted. Psychiatric: Mood and affect are normal. Speech and behavior are normal.  ____________________________________________   LABS (all labs ordered are listed, but only abnormal results are displayed)  Labs Reviewed  BASIC  METABOLIC PANEL - Abnormal; Notable for the following components:      Result Value   Sodium 133 (*)    CO2 20 (*)    Glucose, Bld 302 (*)    Calcium 8.8 (*)    All other components within normal limits  FIBRIN DERIVATIVES D-DIMER ([facility] ONLY) - Abnormal; Notable for the following components:   Fibrin derivatives D-dimer ([facility]) 550.88 (*)    All other components within normal limits  CBC  TROPONIN I  TROPONIN I  POC URINE PREG, ED  POCT PREGNANCY, URINE   ____________________________________________  EKG  ED ECG REPORT I, Nita Sickle, the attending physician, personally viewed and interpreted this ECG.  Normal sinus rhythm, rate of 88, normal intervals, normal axis, no ST elevations or depressions, diffuse T wave flattening.  Unchanged from prior from 2018 ____________________________________________  RADIOLOGY  I have personally reviewed the images performed during this visit and I agree with the Radiologist's read.   Interpretation by Radiologist:  Dg Chest 2 View  Result Date: 10/31/2017 CLINICAL DATA:  Patient with central chest pain.  Nausea. EXAM: CHEST - 2 VIEW COMPARISON:  Chest radiograph  10/05/2016. FINDINGS: Monitoring leads overlie the patient. Normal cardiac and mediastinal contours. No consolidative pulmonary opacities. No pleural effusion or pneumothorax. Thoracic spine degenerative changes. IMPRESSION: No acute cardiopulmonary process. Electronically Signed   By: Annia Belt M.D.   On: 10/31/2017 18:12   Ct Angio Chest Pe W And/or Wo Contrast  Result Date: 10/31/2017 CLINICAL DATA:  Chest pain EXAM: CT ANGIOGRAPHY CHEST WITH CONTRAST TECHNIQUE: Multidetector CT imaging of the chest was performed using the standard protocol during bolus administration of intravenous contrast. Multiplanar CT image reconstructions and MIPs were obtained to evaluate the vascular anatomy. CONTRAST:  75mL ISOVUE-370 IOPAMIDOL (ISOVUE-370) INJECTION 76% COMPARISON:  Chest x-ray 10/31/2017 FINDINGS: Cardiovascular: Satisfactory opacification of the pulmonary arteries to the segmental level. No evidence of pulmonary embolism. Nonaneurysmal aorta. No dissection. Minimal coronary vascular calcification. Normal heart size. No pericardial effusion. Mild aortic atherosclerosis Mediastinum/Nodes: No enlarged mediastinal, hilar, or axillary lymph nodes. Thyroid gland, trachea, and esophagus demonstrate no significant findings. Lungs/Pleura: Lungs are clear. No pleural effusion or pneumothorax. Upper Abdomen: No acute abnormality. Musculoskeletal: No chest wall abnormality. No acute or significant osseous findings. Minor degenerative changes. Review of the MIP images confirms the above findings. IMPRESSION: 1. Negative for acute pulmonary embolus or aortic dissection 2. Clear lung fields Aortic Atherosclerosis (ICD10-I70.0). Electronically Signed   By: Jasmine Pang M.D.   On: 10/31/2017 19:47     ____________________________________________   PROCEDURES  Procedure(s) performed: None Procedures Critical Care performed:  None ____________________________________________   INITIAL IMPRESSION / ASSESSMENT AND  PLAN / ED COURSE  52 y.o. female with a history of tachycardia, diabetes, asthma who presents for evaluation of chest pain.  Patient is asymptomatic at this time.  Her initial EKG shows no ischemic changes.  First troponin is negative.  Low suspicion for ACS however will get a second troponin every 4 hours due to nausea and diaphoresis the patient had with her last episode.  Patient is also complaining of swelling in her left ankle which is not appreciated on exam and she is on birth control shots therefore slightly increased risk for blood clots.  D-dimer slightly elevated we will pursue a CT angiogram.  At this time there are no clinical signs or symptoms of dissection, pneumonia, gallbladder disease, or other intra-abdominal pathologies.  We will give an aspirin and monitor on telemetry.  _________________________ 10:41 PM on 10/31/2017 -----------------------------------------  D-dimer was positive and patient was sent for CT angiogram which was negative for PE.  She remained with no further episodes of chest pain in the emergency room.  She had 2 troponins that were negative.  Patient is low risk for heart score.  Will refer her to cardiology.  Discussed return precautions.   As part of my medical decision making, I reviewed the following data within the electronic MEDICAL RECORD NUMBER Nursing notes reviewed and incorporated, Labs reviewed , EKG interpreted , Old chart reviewed, Radiograph reviewed , Notes from prior ED visits and [place] Controlled Substance Database    Pertinent labs & imaging results that were available during my care of the patient were reviewed by me and considered in my medical decision making (see chart for details).    ____________________________________________   FINAL CLINICAL IMPRESSION(S) / ED DIAGNOSES  Final diagnoses:  Chest pain, unspecified type  Hyperglycemia      NEW MEDICATIONS STARTED DURING THIS VISIT:  ED Discharge Orders    None        Note:  This document was prepared using Dragon voice recognition software and may include unintentional dictation errors.    Nita Sickle, MD 10/31/17 2241

## 2017-11-15 ENCOUNTER — Ambulatory Visit: Admitting: Primary Care

## 2017-12-12 ENCOUNTER — Other Ambulatory Visit: Payer: Self-pay | Admitting: Primary Care

## 2017-12-12 DIAGNOSIS — I10 Essential (primary) hypertension: Secondary | ICD-10-CM

## 2017-12-12 DIAGNOSIS — R000 Tachycardia, unspecified: Secondary | ICD-10-CM

## 2017-12-20 ENCOUNTER — Encounter: Payer: Self-pay | Admitting: Internal Medicine

## 2017-12-20 ENCOUNTER — Ambulatory Visit (INDEPENDENT_AMBULATORY_CARE_PROVIDER_SITE_OTHER): Admitting: Internal Medicine

## 2017-12-20 VITALS — BP 110/62 | HR 98 | Ht 62.0 in | Wt 247.6 lb

## 2017-12-20 DIAGNOSIS — E1165 Type 2 diabetes mellitus with hyperglycemia: Secondary | ICD-10-CM | POA: Diagnosis not present

## 2017-12-20 LAB — POCT GLYCOSYLATED HEMOGLOBIN (HGB A1C): HEMOGLOBIN A1C: 10.8 % — AB (ref 4.0–5.6)

## 2017-12-20 MED ORDER — METFORMIN HCL 1000 MG PO TABS: 1000.0000 mg | ORAL_TABLET | Freq: Two times a day (BID) | ORAL | 3 refills | Status: DC

## 2017-12-20 MED ORDER — DULAGLUTIDE 0.75 MG/0.5ML ~~LOC~~ SOAJ
SUBCUTANEOUS | 1 refills | Status: DC
Start: 2017-12-20 — End: 2018-01-31

## 2017-12-20 MED ORDER — DULAGLUTIDE 1.5 MG/0.5ML ~~LOC~~ SOAJ: 1.5000 mg | SUBCUTANEOUS | 11 refills | Status: DC

## 2017-12-20 NOTE — Patient Instructions (Addendum)
Please stop JanuMet and start: - Metformin 1000 mg 2x a day with meals  Please start Trulicity 0.75 mg weekly. After 1 month, start the higher dose (1.5 mg).  Continue to work on the diet.  Please let me know if the sugars are consistently <80 or >200.  Please come back for a follow-up appointment in 3 months.  PATIENT INSTRUCTIONS FOR TYPE 2 DIABETES:  **Please join MyChart!** - see attached instructions about how to join if you have not done so already.  DIET AND EXERCISE Diet and exercise is an important part of diabetic treatment.  We recommended aerobic exercise in the form of brisk walking (working between 40-60% of maximal aerobic capacity, similar to brisk walking) for 150 minutes per week (such as 30 minutes five days per week) along with 3 times per week performing 'resistance' training (using various gauge rubber tubes with handles) 5-10 exercises involving the major muscle groups (upper body, lower body and core) performing 10-15 repetitions (or near fatigue) each exercise. Start at half the above goal but build slowly to reach the above goals. If limited by weight, joint pain, or disability, we recommend daily walking in a swimming pool with water up to waist to reduce pressure from joints while allow for adequate exercise.    BLOOD GLUCOSES Monitoring your blood glucoses is important for continued management of your diabetes. Please check your blood glucoses 2-4 times a day: fasting, before meals and at bedtime (you can rotate these measurements - e.g. one day check before the 3 meals, the next day check before 2 of the meals and before bedtime, etc.).   HYPOGLYCEMIA (low blood sugar) Hypoglycemia is usually a reaction to not eating, exercising, or taking too much insulin/ other diabetes drugs.  Symptoms include tremors, sweating, hunger, confusion, headache, etc. Treat IMMEDIATELY with 15 grams of Carbs: . 4 glucose tablets .  cup regular juice/soda . 2 tablespoons  raisins . 4 teaspoons sugar . 1 tablespoon honey Recheck blood glucose in 15 mins and repeat above if still symptomatic/blood glucose <100.  RECOMMENDATIONS TO REDUCE YOUR RISK OF DIABETIC COMPLICATIONS: * Take your prescribed MEDICATION(S) * Follow a DIABETIC diet: Complex carbs, fiber rich foods, (monounsaturated and polyunsaturated) fats * AVOID saturated/trans fats, high fat foods, >2,300 mg salt per day. * EXERCISE at least 5 times a week for 30 minutes or preferably daily.  * DO NOT SMOKE OR DRINK more than 1 drink a day. * Check your FEET every day. Do not wear tightfitting shoes. Contact us if you develop an ulcer * See your EYE doctor once a year or more if needed * Get a FLU shot once a year * Get a PNEUMONIA vaccine once before and once after age 50 years  GOALS:  * Your Hemoglobin A1c of <7%  * fasting sugars need to be <130 * after meals sugars need to be <180 (2h after you start eating) * Your Systolic BP should be 140 or lower  * Your Diastolic BP should be 80 or lower  * Your HDL (Good Cholesterol) should be 40 or higher  * Your LDL (Bad Cholesterol) should be 100 or lower. * Your Triglycerides should be 150 or lower  * Your Urine microalbumin (kidney function) should be <30 * Your Body Mass Index should be 25 or lower    Please consider the following ways to cut down carbs and fat and increase fiber and micronutrients in your diet: - substitute whole grain for white bread or pasta -  substitute brown rice for white rice - substitute 90-calorie flat bread pieces for slices of bread when possible - substitute sweet potatoes or yams for white potatoes - substitute humus for margarine - substitute tofu for cheese when possible - substitute almond or rice milk for regular milk (would not drink soy milk daily due to concern for soy estrogen influence on breast cancer risk) - substitute dark chocolate for other sweets when possible - substitute water - can add lemon or  orange slices for taste - for diet sodas (artificial sweeteners will trick your body that you can eat sweets without getting calories and will lead you to overeating and weight gain in the long run) - do not skip breakfast or other meals (this will slow down the metabolism and will result in more weight gain over time)  - can try smoothies made from fruit and almond/rice milk in am instead of regular breakfast - can also try old-fashioned (not instant) oatmeal made with almond/rice milk in am - order the dressing on the side when eating salad at a restaurant (pour less than half of the dressing on the salad) - eat as little meat as possible - can try juicing, but should not forget that juicing will get rid of the fiber, so would alternate with eating raw veg./fruits or drinking smoothies - use as little oil as possible, even when using olive oil - can dress a salad with a mix of balsamic vinegar and lemon juice, for e.g. - use agave nectar, stevia sugar, or regular sugar rather than artificial sweateners - steam or broil/roast veggies  - snack on veggies/fruit/nuts (unsalted, preferably) when possible, rather than processed foods - reduce or eliminate aspartame in diet (it is in diet sodas, chewing gum, etc) Read the labels!  Try to read Dr. Janene Harvey book: "Program for Reversing Diabetes" for other ideas for healthy eating.

## 2017-12-20 NOTE — Progress Notes (Signed)
Patient ID: Melissa [name], female   DOB: 1965-10-17, 52 y.o.   MRN: [id number]   HPI: Melissa [name] is a 52 y.o.-year-old female, self-referred, for management of DM2, dx in 1997, non-insulin-dependent, uncontrolled, without long-term complications.  She c/o Blurry vision, heat intolerance, weight gain.  Last hemoglobin A1c was: Lab Results  Component Value Date   HGBA1C 7.4 (H) 05/12/2017   HGBA1C 7.5 (H) 11/02/2016   HGBA1C 7.0 (H) 01/27/2016  1997: HbA1c 11%  Pt is on a regimen of: - JanuMet 50-1000 mg 2x a day, with meals She was on Toujeo  - had a low: 53 after food. Prev. On insulin  - 2010.  Pt does not check sugars, but her glucose was 302 in 10/2017 at last visit with PCP. - am: n/c - 2h after b'fast: n/c - before lunch: n/c - 2h after lunch: n/c - before dinner: n/c - 2h after dinner: n/c - bedtime: n/c - nighttime: n/c No lows. Lowest sugar was 50s in the past; she has hypoglycemia awareness at 70.  Highest sugar was 308.  Glucometer: none  Pt's meals are -mostly fast food - Breakfast: skips b'fast usually - Lunch: sandwich, fast food - Dinner: cooked meal - Snacks: many  - carbs   - no CKD, last BUN/creatinine:  Lab Results  Component Value Date   BUN 14 10/31/2017   BUN 17 10/05/2016   CREATININE 0.81 10/31/2017   CREATININE 0.89 10/05/2016  On telmisartan 40  - + HL; last set of lipids: Lab Results  Component Value Date   CHOL 205 (H) 05/12/2017   HDL 50.20 05/12/2017   LDLCALC 122 (H) 05/12/2017   LDLDIRECT 123.0 01/27/2016   TRIG 166.0 (H) 05/12/2017   CHOLHDL 4 05/12/2017  Not on a statin.  - last eye exam was in 2019. No DR.   - no numbness and tingling in her feet.  Pt is adopted - ? FH of DM.  She lost 55 lbs last year ([employer]), then gained back 40 lbs since last fall.  She also has fatty liver, GERD.  She tells me that she has allergies to most antibiotics, but she can take Levaquin, Z-Pack, Avelox,  doxycycline.  ROS: Constitutional: + Fatigue, + weight gain, + feeling excessively hot, + hot flashes, + poor sleep Eyes: no blurry vision, no xerophthalmia ENT: no sore throat, no nodules palpated in throat, no dysphagia/odynophagia, no hoarseness Cardiovascular: + CP/SOB/palpitations/+ leg swelling Respiratory: no cough/SOB Gastrointestinal: + N/no V/+ D/no C/+ acid reflux Musculoskeletal: + Both: Muscle/joint aches Skin: no rashes, + itching, + hair loss Neurological: no tremors/numbness/tingling/dizziness, + headache Psychiatric: no depression/anxiety + Low libido  Past Medical History:  Diagnosis Date  . Asthma   . Complication of anesthesia   . Diabetes mellitus without complication (HCC)    Type II  . Dysrhythmia    Tachycardia  . GERD (gastroesophageal reflux disease)   . Pneumonia    2014- 'Walking"  . Shortness of breath dyspnea    with exertion   Past Surgical History:  Procedure Laterality Date  . CHOLECYSTECTOMY  1997  . COLONOSCOPY  2015  . CYSTOSCOPY     age 34- stretched stem of bladder  . 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 SHOULDER ARTHROSCOPY Left 11/20/2014   Procedure: ARTHROSCOPY SHOULDER WITH MUA, ROTATOR INTERVAL RELEASE;  Surgeon: Cammy Copa, MD;  Location: [facility] OR;  Service: Orthopedics;  Laterality: Left;  LEFT SHOULDER MUA, DOA, ROTATOR INTERVAL RELEASE.  Marland Kitchen  TONSILLECTOMY  2010   Social History   Socioeconomic History  . Marital status: Married    Spouse name: Not on file  . Number of children: 1  . Years of education: Not on file  . Highest education level: Not on file  Occupational History  .   Tobacco Use  . Smoking status: Never Smoker  . Smokeless tobacco: Never Used  Substance and Sexual Activity  . Alcohol use: Yes    Alcohol/week: 0.0 oz    Comment: rare  . Drug use: No      Social History Narrative   Married.   1 daughter.   Works at [employer].   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    Current Outpatient  Medications on File Prior to Visit  Medication Sig Dispense Refill  . JANUMET 50-1000 MG tablet TAKE 1 TABLET TWICE A DAY WITH MEALS 180 tablet 1  . medroxyPROGESTERone (DEPO-PROVERA) 150 MG/ML injection Inject 150 mg into the muscle every 3 (three) months.    . nebivolol (BYSTOLIC) 10 MG tablet Take 1 tablet (10 mg total) by mouth daily. MUST SCHEDULE OFFICE VISIT 90 tablet 0  . telmisartan (MICARDIS) 40 MG tablet Take 1 tablet (40 mg total) by mouth at bedtime. MUST SCHEDULE OFFICE VISIT 90 tablet 0  . albuterol (PROVENTIL HFA;VENTOLIN HFA) 108 (90 Base) MCG/ACT inhaler Inhale 2 puffs into the lungs every 6 (six) hours as needed for wheezing or shortness of breath. (Patient not taking: Reported on 12/20/2017) 1 Inhaler 2  . zolpidem (AMBIEN) 10 MG tablet Take 1 tablet (10 mg total) by mouth at bedtime as needed for sleep. 90 tablet 0   No current facility-administered medications on file prior to visit.    Allergies  Allergen Reactions  . Amoxicillin Anaphylaxis and Other (See Comments)    Has patient had a PCN reaction causing immediate rash, facial/tongue/throat swelling, SOB or lightheadedness with hypotension: Yes Has patient had a PCN reaction causing severe rash involving mucus membranes or skin necrosis: No Has patient had a PCN reaction that required hospitalization No Has patient had a PCN reaction occurring within the last 10 years: No If all of the above answers are "NO", then [name] proceed with Cephalosporin use.  . Ciprofloxacin Anaphylaxis  . Erythromycin Anaphylaxis  . Other Anaphylaxis    Pt states that she can only take Avelox, Levaquin, and Zithromax.    Marland Kitchen. Penicillins Anaphylaxis and Other (See Comments)    Has patient had a PCN reaction causing immediate rash, facial/tongue/throat swelling, SOB or lightheadedness with hypotension: Yes Has patient had a PCN reaction causing severe rash involving mucus membranes or skin necrosis: No Has patient had a PCN reaction that  required hospitalization No Has patient had a PCN reaction occurring within the last 10 years: No If all of the above answers are "NO", then [name] proceed with Cephalosporin use.  . Sulfa Antibiotics Anaphylaxis  . Vicodin [Hydrocodone-Acetaminophen] Itching   Family History  Adopted: Yes  Problem Relation Age of Onset  . Other Unknown        adopted    PE: BP 110/62   Pulse 98   Ht 5\' 2"  (1.575 m)   Wt 247 lb 9.6 oz (112.3 kg)   SpO2 96%   BMI 45.29 kg/m  Wt Readings from Last 3 Encounters:  12/20/17 247 lb 9.6 oz (112.3 kg)  10/31/17 240 lb (108.9 kg)  05/12/17 219 lb 1.9 oz (99.4 kg)   Constitutional: overweight, in NAD Eyes:  PERRLA, EOMI, no exophthalmos ENT: moist mucous membranes, no thyromegaly, no cervical lymphadenopathy Cardiovascular: Tachycardia, RR, No MRG Respiratory: CTA B Gastrointestinal: abdomen soft, NT, ND, BS+ Musculoskeletal: no deformities, strength intact in all 4 Skin: moist, warm, no rashes Neurological: no tremor with outstretched hands, DTR normal in all 4  ASSESSMENT: 1. DM2, non-insulin-dependent, uncontrolled, without long-term complications, but with hyperglycemia  PLAN:  1. Patient with long-standing, uncontrolled diabetes, on oral antidiabetic regimen, which became insufficient. HbA1c today is high, 10.8%. - She describes that she was on a diet at the end of last year and she was able to lose 55 pounds while on herbal life.  Since then, she gained almost 40 pounds back up so she came off the diet.  With this, her sugars worsened.  She is not checking sugars at home, but at last visit with PCP, a glucose was in the 300s.   - At this visit, we discussed about the fact that she will absolutely need to improve her diet to have a chance to improve her diabetes.  She refuses a referral to nutrition as she tells me that she is aware of what she needs to do, but this is very difficult for her to implement - I suggested to stop Januvia part of  Janumet, continue only on metformin, and also start a GLP-1 receptor agonist, which is known to help with diabetes, weight loss, and also fatty liver.  She agrees to do this and we will try to start Trulicity which appears to be covered by her insurance.  We will start with a low dose and advance as tolerated.  I advised her about the benefits and possible side effects. - I suggested to:  Patient Instructions  Please stop JanuMet and start: - Metformin 1000 mg 2x a day with meals  Please start Trulicity 0.75 mg weekly. After 1 month, start the higher dose (1.5 mg).  Continue to work on the diet.  Please let me know if the sugars are consistently <80 or >200.  Please come back for a follow-up appointment in 3 months.  - Strongly advised her to start checking sugars at different times of the day - check once a day, rotating checks - given sugar log and advised how to fill it and to bring it at next appt  - given foot care handout and explained the principles  - given instructions for hypoglycemia management "15-15 rule"  - advised for yearly eye exams  - Return to clinic in 3 mo with sugar log   Carlus Pavlov, MD PhD [facility]

## 2017-12-21 ENCOUNTER — Other Ambulatory Visit: Payer: Self-pay | Admitting: Primary Care

## 2017-12-21 DIAGNOSIS — G4700 Insomnia, unspecified: Secondary | ICD-10-CM

## 2017-12-21 MED ORDER — ZOLPIDEM TARTRATE 10 MG PO TABS: 10.0000 mg | ORAL_TABLET | Freq: Every evening | ORAL | 0 refills | Status: DC | PRN

## 2017-12-21 NOTE — Telephone Encounter (Signed)
Name of Medication: zolpidem (AMBIEN) 10 MG tablet  Name of Pharmacy: [facility]  Last Fill or Written Date and Quantity: 09/10/2017 #90  Last Office Visit and Type: 05/12/2017 follow up appt  Next Office Visit and Type:   Last Controlled Substance Agreement Date: 11/11/2016  Last UDS: 11/11/2016

## 2017-12-21 NOTE — Telephone Encounter (Signed)
Noted, refill sent to pharmacy. 

## 2018-01-07 ENCOUNTER — Telehealth: Payer: Self-pay | Admitting: Internal Medicine

## 2018-01-07 NOTE — Telephone Encounter (Signed)
Disregard sent message to Judeth CornfieldStephanie this message is for Dr Elvera LennoxGherghe.    Please advise on below

## 2018-01-07 NOTE — Telephone Encounter (Signed)
Please advise on below  

## 2018-01-07 NOTE — Telephone Encounter (Signed)
Per medication list on 12/20/17 the 1.5 mg was sent for the pt. Called pt and LM for her to call back to discuss

## 2018-01-07 NOTE — Telephone Encounter (Signed)
°  Patient stated her sugars are still running high 200 and low 300 . She would like to know if she should be taking a higher dose of Dulaglutide (TRULICITY) 0.75 MG/0.5ML SOPN    Patient stated she also needs a prescription for a generic meter and strips sent into the pharmacy   Please advise    [id number] - [place],  - [address]

## 2018-01-07 NOTE — Telephone Encounter (Signed)
Yes, let's send the 1.5 mg Trulicity #4 pens with 5 refills.  I am not sure which meter she prefers. She can get the Relion from [facility] - cheap, w/o Rx.

## 2018-01-10 NOTE — Telephone Encounter (Signed)
LMTCB

## 2018-01-31 ENCOUNTER — Other Ambulatory Visit: Payer: Self-pay

## 2018-01-31 MED ORDER — DULAGLUTIDE 1.5 MG/0.5ML ~~LOC~~ SOAJ: 1.5000 mg | SUBCUTANEOUS | 11 refills | Status: DC

## 2018-01-31 MED ORDER — METFORMIN HCL 1000 MG PO TABS: 1000.0000 mg | ORAL_TABLET | Freq: Two times a day (BID) | ORAL | 3 refills | Status: DC

## 2018-03-15 ENCOUNTER — Other Ambulatory Visit: Payer: Self-pay | Admitting: Primary Care

## 2018-03-15 DIAGNOSIS — R000 Tachycardia, unspecified: Secondary | ICD-10-CM

## 2018-03-15 DIAGNOSIS — I10 Essential (primary) hypertension: Secondary | ICD-10-CM

## 2018-04-04 ENCOUNTER — Other Ambulatory Visit: Payer: Self-pay

## 2018-04-04 DIAGNOSIS — G4700 Insomnia, unspecified: Secondary | ICD-10-CM

## 2018-04-04 MED ORDER — ZOLPIDEM TARTRATE 10 MG PO TABS: 10.0000 mg | ORAL_TABLET | Freq: Every evening | ORAL | 0 refills | Status: DC | PRN

## 2018-04-04 NOTE — Telephone Encounter (Signed)
Please notify patient that she's overdue for follow up and will need an office visit for any further refills. I will send a 30 day supply for now.

## 2018-04-04 NOTE — Telephone Encounter (Signed)
Last office visit 09/13/2017 for exposure to influenza. No future appointments. Last refilled 12/21/2017 for #90 with no refills.

## 2018-04-04 NOTE — Telephone Encounter (Signed)
Copied from CRM [id number]. Topic: Quick Communication - Rx Refill/Question >> Apr 04, 2018  1:04 PM Percival Spanish wrote: Medication zolpidem (AMBIEN) 10 MG tablet (Expired)  Has the patient contacted their pharmacy  yes   [facility]erred [facility]   Agent: Please be advised that RX refills may take up to 3 business days. We ask that you follow-up with your pharmacy.

## 2018-04-05 NOTE — Telephone Encounter (Signed)
Phone number on file is inactive. I sent a message via mychart asking her to give our office a call

## 2018-04-07 ENCOUNTER — Ambulatory Visit (INDEPENDENT_AMBULATORY_CARE_PROVIDER_SITE_OTHER): Admitting: Internal Medicine

## 2018-04-07 ENCOUNTER — Encounter: Payer: Self-pay | Admitting: Internal Medicine

## 2018-04-07 VITALS — BP 112/70 | HR 100 | Ht 62.0 in | Wt 242.0 lb

## 2018-04-07 DIAGNOSIS — Z6841 Body Mass Index (BMI) 40.0 and over, adult: Secondary | ICD-10-CM

## 2018-04-07 DIAGNOSIS — E1165 Type 2 diabetes mellitus with hyperglycemia: Secondary | ICD-10-CM

## 2018-04-07 DIAGNOSIS — E785 Hyperlipidemia, unspecified: Secondary | ICD-10-CM | POA: Diagnosis not present

## 2018-04-07 LAB — POCT GLYCOSYLATED HEMOGLOBIN (HGB A1C): HEMOGLOBIN A1C: 10.1 % — AB (ref 4.0–5.6)

## 2018-04-07 MED ORDER — BLOOD GLUCOSE MONITOR KIT: PACK | 0 refills | Status: AC

## 2018-04-07 MED ORDER — DULAGLUTIDE 1.5 MG/0.5ML ~~LOC~~ SOAJ: 1.5000 mg | SUBCUTANEOUS | 3 refills | Status: DC

## 2018-04-07 MED ORDER — INSULIN PEN NEEDLE 32G X 4 MM MISC: 3 refills | Status: DC

## 2018-04-07 MED ORDER — GLUCOSE BLOOD VI STRP: ORAL_STRIP | 3 refills | Status: DC

## 2018-04-07 MED ORDER — LANCETS MISC: 3 refills | Status: AC

## 2018-04-07 MED ORDER — INSULIN GLARGINE 300 UNIT/ML ~~LOC~~ SOPN: 34.0000 [IU] | PEN_INJECTOR | Freq: Every day | SUBCUTANEOUS | 3 refills | Status: DC

## 2018-04-07 NOTE — Patient Instructions (Addendum)
Please continue: - Metformin 1000 mg 2x a day with meals - Trulicity 1.5 mg weekly  Please start:  - Toujeo 18 units at bedtime - increase by 4 units every 4 days until sugars in am are <140 or up to 34 units  Please come back for a follow-up appointment in 3 months.

## 2018-04-07 NOTE — Addendum Note (Signed)
Addended by: CONGER, MELISSA I on: 04/07/2018 05:08 PM   Modules accepted: Orders  

## 2018-04-07 NOTE — Progress Notes (Signed)
Patient ID: Melissa [name], female   DOB: 29-Jun-1965, 52 y.o.   MRN: [id number]   HPI: Melissa [name] is a 52 y.o.-year-old female, returning for f/u for DM2, dx in 1997, non-insulin-dependent, uncontrolled, without long-term complications. Last OV 3.5 mo ago.  Last hemoglobin A1c was: Lab Results  Component Value Date   HGBA1C 10.8 (A) 12/20/2017   HGBA1C 7.4 (H) 05/12/2017   HGBA1C 7.5 (H) 11/02/2016  1997: HbA1c 11%  Pt was on a regimen of: - JanuMet 50-1000 mg 2x a day, with meals She was on Toujeo  - had a low: 53 after food. Prev. On insulin  - 2010.  Now on: - Metformin 1000 mg 2x a day with meals - Trulicity 1.5 mg weekly - nausea at the beginning, now resolved  Pt checks sugars 1x a day: - am: n/c >> 235 - 2h after b'fast: n/c - before lunch: n/c - 2h after lunch: n/c - before dinner: n/c >> 206 - 2h after dinner: n/c >> 253-337 - bedtime: n/c >> 197, 288 - nighttime: n/c No lows. Lowest sugar was 50s in the past >> 148; she has hypoglycemia awareness at 70.  Highest sugar was 308 >> 337.  Glucometer: RelioN  Pt's meals are -mostly fast food - Breakfast: skips b'fast usually - Lunch: sandwich, fast food - Dinner: cooked meal - Snacks: many  - carbs   - no CKD, last BUN/creatinine:  Lab Results  Component Value Date   BUN 14 10/31/2017   BUN 17 10/05/2016   CREATININE 0.81 10/31/2017   CREATININE 0.89 10/05/2016  On Telmisartan.  - + HL; last set of lipids: Lab Results  Component Value Date   CHOL 205 (H) 05/12/2017   HDL 50.20 05/12/2017   LDLCALC 122 (H) 05/12/2017   LDLDIRECT 123.0 01/27/2016   TRIG 166.0 (H) 05/12/2017   CHOLHDL 4 05/12/2017  Not on a statin.  - last eye exam was in 2019 >> No DR  - no numbness and tingling in her feet.  Pt is adopted - ? FH of DM.  She lost 55 lbs last year ([employer]), then gained back 40 lbs since last fall.  She also has fatty liver, GERD.  She tells me that she has allergies to most antibiotics, but she  can take Levaquin, Z-Pack, Avelox, doxycycline.  ROS: Constitutional: no weight gain/no weight loss, +fatigue, + hot flashes, no subjective hypothermia Eyes: no blurry vision, no xerophthalmia ENT: no sore throat, no nodules palpated in neck, no dysphagia, no odynophagia, no hoarseness Cardiovascular: no CP/no SOB/no palpitations/no leg swelling Respiratory: no cough/no SOB/no wheezing Gastrointestinal: no N/no V/no D/no C/+ acid reflux Musculoskeletal: no muscle aches/+ joint aches -after an avulsion fracture of her greater trochanter of right femur Skin: no rashes, no hair loss Neurological: no tremors/no numbness/no tingling/no dizziness  I reviewed pt's medications, allergies, PMH, social hx, family hx, and changes were documented in the history of present illness. Otherwise, unchanged from my initial visit note.  Past Medical History:  Diagnosis Date  . Asthma   . Complication of anesthesia   . Diabetes mellitus without complication (HCC)    Type II  . Dysrhythmia    Tachycardia  . GERD (gastroesophageal reflux disease)   . Pneumonia    2014- 'Walking"  . Shortness of breath dyspnea    with exertion   Past Surgical History:  Procedure Laterality Date  . CHOLECYSTECTOMY  1997  . COLONOSCOPY  2015  . CYSTOSCOPY  age 67- stretched stem of bladder  . 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 SHOULDER ARTHROSCOPY Left 11/20/2014   Procedure: ARTHROSCOPY SHOULDER WITH MUA, ROTATOR INTERVAL RELEASE;  Surgeon: Cammy Copa, MD;  Location: [facility] OR;  Service: Orthopedics;  Laterality: Left;  LEFT SHOULDER MUA, DOA, ROTATOR INTERVAL RELEASE.  . TONSILLECTOMY  2010   Social History   Socioeconomic History  . Marital status: Married    Spouse name: Not on file  . Number of children: 1  . Years of education: Not on file  . Highest education level: Not on file  Occupational History  .   Tobacco Use  . Smoking status: Never Smoker  . Smokeless tobacco: Never Used  Substance  and Sexual Activity  . Alcohol use: Yes    Alcohol/week: 0.0 oz    Comment: rare  . Drug use: No      Social History Narrative   Married.   1 daughter.   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    Current Outpatient Medications on File Prior to Visit  Medication Sig Dispense Refill  . albuterol (PROVENTIL HFA;VENTOLIN HFA) 108 (90 Base) MCG/ACT inhaler Inhale 2 puffs into the lungs every 6 (six) hours as needed for wheezing or shortness of breath. (Patient not taking: Reported on 12/20/2017) 1 Inhaler 2  . BYSTOLIC 10 MG tablet TAKE 1 TABLET DAILY (MUST SCHEDULE OFFICE VISIT) 90 tablet 0  . Dulaglutide (TRULICITY) 1.5 MG/0.5ML SOPN Inject 1.5 mg into the skin once a week. 4 pen 11  . JANUMET 50-1000 MG tablet TAKE 1 TABLET TWICE A DAY WITH MEALS 180 tablet 1  . medroxyPROGESTERone (DEPO-PROVERA) 150 MG/ML injection Inject 150 mg into the muscle every 3 (three) months.    . metFORMIN (GLUCOPHAGE) 1000 MG tablet Take 1 tablet (1,000 mg total) by mouth 2 (two) times daily with a meal. 180 tablet 3  . MICARDIS 40 MG tablet TAKE 1 TABLET AT BEDTIME MUST SCHEDULE OFFICE VISIT 90 tablet 0  . zolpidem (AMBIEN) 10 MG tablet Take 1 tablet (10 mg total) by mouth at bedtime as needed for sleep. 30 tablet 0   No current facility-administered medications on file prior to visit.    Allergies  Allergen Reactions  . Amoxicillin Anaphylaxis and Other (See Comments)    Has patient had a PCN reaction causing immediate rash, facial/tongue/throat swelling, SOB or lightheadedness with hypotension: Yes Has patient had a PCN reaction causing severe rash involving mucus membranes or skin necrosis: No Has patient had a PCN reaction that required hospitalization No Has patient had a PCN reaction occurring within the last 10 years: No If all of the above answers are "NO", then [name] proceed with Cephalosporin use.  . Ciprofloxacin Anaphylaxis  . Erythromycin Anaphylaxis  . Other  Anaphylaxis    Pt states that she can only take Avelox, Levaquin, and Zithromax.    Marland Kitchen Penicillins Anaphylaxis and Other (See Comments)    Has patient had a PCN reaction causing immediate rash, facial/tongue/throat swelling, SOB or lightheadedness with hypotension: Yes Has patient had a PCN reaction causing severe rash involving mucus membranes or skin necrosis: No Has patient had a PCN reaction that required hospitalization No Has patient had a PCN reaction occurring within the last 10 years: No If all of the above answers are "NO", then [name] proceed with Cephalosporin use.  . Sulfa Antibiotics Anaphylaxis  . Vicodin [Hydrocodone-Acetaminophen] Itching   Family History  Adopted:  Yes  Problem Relation Age of Onset  . Other Unknown        adopted    PE: BP 112/70   Pulse 100   Ht 5\' 2"  (1.575 m)   Wt 242 lb (109.8 kg)   SpO2 97%   BMI 44.26 kg/m  Wt Readings from Last 3 Encounters:  04/07/18 242 lb (109.8 kg)  12/20/17 247 lb 9.6 oz (112.3 kg)  10/31/17 240 lb (108.9 kg)   Constitutional: overweight, in NAD Eyes: PERRLA, EOMI, no exophthalmos ENT: moist mucous membranes, no thyromegaly, no cervical lymphadenopathy Cardiovascular: Tachycardia RR, No MRG Respiratory: CTA B Gastrointestinal: abdomen soft, NT, ND, BS+ Musculoskeletal: no deformities, strength intact in all 4 Skin: moist, warm, no rashes Neurological: no tremor with outstretched hands, DTR normal in all 4  ASSESSMENT: 1. DM2, non-insulin-dependent, uncontrolled, without long-term complications, but with hyperglycemia  2.  Hyperlipidemia  3.  Obesity  PLAN:  1. Patient with long-standing, uncontrolled diabetes, on oral antidiabetic regimen and now GLP-1 receptor agonist started at last visit.  At that time, she was on Janumet, however, this was not deficient.  At last visit, she was not checking sugars at home at all and I strongly advised her to start.  She refused referral to nutrition and she was telling me  that she was aware of what she needed to do but it was very difficult for her to implement this changes. - At last visit, HbA1c was very high, at 10.8% - At this visit, sugars are still very high, in the 200s and 300s despite using metformin and Trulicity.  We discussed that we absolutely need to add long-acting insulin for now.  At next visit, I need to check her for type 1 diabetes. - It appears the Toujeo is covered by her insurance and I sent a prescription to her pharmacy.  She is an [employer] fund[employer] and knows how to give the injections.  We will started a low dose and advance as needed to improve her sugars in the morning.  I advised her to contact me if she needs to get above 34 units of Toujeo daily.  Discussed about possible side effects including weight gain. - I suggested to:  Patient Instructions  Please continue: - Metformin 1000 mg 2x a day with meals - Trulicity 1.5 mg weekly  Please start:  - Toujeo 18 units at bedtime - increase by 4 units every 4 days until sugars in am are <140 or up to 34 units  Please come back for a follow-up appointment in 3 months.  - today, HbA1c is 10.1% (improved, but still very high) - continue checking sugars at different times of the day - check 1x a day, rotating checks - advised for yearly eye exams >> she is UTD - had flu shot at work - Return to clinic in 3 mo with sugar log   2.  Hyperlipidemia - Reviewed latest lipid panel from 04/2017: LDL high, and also triglycerides high Lab Results  Component Value Date   CHOL 205 (H) 05/12/2017   HDL 50.20 05/12/2017   LDLCALC 122 (H) 05/12/2017   LDLDIRECT 123.0 01/27/2016   TRIG 166.0 (H) 05/12/2017   CHOLHDL 4 05/12/2017  -She is not on a statin -She is due for another lipid panel -we will check this after her diabetes improves, at next visit  3.  Obesity -She lost 5 pounds since last visit -Continue Trulicity which is also helping with weight loss, however, unfortunately, will need to  add insulin  which is conducive to weight gain.  Discussed about the need to improve her diet especially while on insulin.  Carlus Pavlov, MD PhD [facility]

## 2018-04-15 ENCOUNTER — Other Ambulatory Visit: Payer: Self-pay

## 2018-04-15 MED ORDER — GLUCOSE BLOOD VI STRP: ORAL_STRIP | 12 refills | Status: DC

## 2018-04-29 ENCOUNTER — Encounter: Payer: Self-pay | Admitting: *Deleted

## 2018-04-29 ENCOUNTER — Other Ambulatory Visit: Payer: Self-pay | Admitting: Primary Care

## 2018-04-29 ENCOUNTER — Ambulatory Visit (INDEPENDENT_AMBULATORY_CARE_PROVIDER_SITE_OTHER): Admitting: Primary Care

## 2018-04-29 VITALS — BP 110/70 | HR 97 | Temp 97.5°F | Ht 62.0 in | Wt 244.0 lb

## 2018-04-29 DIAGNOSIS — G4700 Insomnia, unspecified: Secondary | ICD-10-CM | POA: Diagnosis not present

## 2018-04-29 DIAGNOSIS — E1165 Type 2 diabetes mellitus with hyperglycemia: Secondary | ICD-10-CM

## 2018-04-29 DIAGNOSIS — R748 Abnormal levels of other serum enzymes: Secondary | ICD-10-CM

## 2018-04-29 DIAGNOSIS — E785 Hyperlipidemia, unspecified: Secondary | ICD-10-CM | POA: Diagnosis not present

## 2018-04-29 DIAGNOSIS — K219 Gastro-esophageal reflux disease without esophagitis: Secondary | ICD-10-CM | POA: Diagnosis not present

## 2018-04-29 LAB — COMPREHENSIVE METABOLIC PANEL
ALBUMIN: 3.7 g/dL (ref 3.5–5.2)
ALK PHOS: 84 U/L (ref 39–117)
ALT: 72 U/L — ABNORMAL HIGH (ref 0–35)
AST: 48 U/L — AB (ref 0–37)
BILIRUBIN TOTAL: 0.5 mg/dL (ref 0.2–1.2)
BUN: 13 mg/dL (ref 6–23)
CO2: 27 mEq/L (ref 19–32)
Calcium: 8.8 mg/dL (ref 8.4–10.5)
Chloride: 102 mEq/L (ref 96–112)
Creatinine, Ser: 0.89 mg/dL (ref 0.40–1.20)
GFR: 71.32 mL/min (ref >60.00)
GLUCOSE: 283 mg/dL — AB (ref 70–99)
POTASSIUM: 4 meq/L (ref 3.5–5.1)
SODIUM: 138 meq/L (ref 135–145)
Total Protein: 6.8 g/dL (ref 6.0–8.3)

## 2018-04-29 LAB — LDL CHOLESTEROL, DIRECT: Direct LDL: 109 mg/dL

## 2018-04-29 LAB — LIPID PANEL
Cholesterol: 172 mg/dL (ref 0–200)
HDL: 43.4 mg/dL (ref >39.00)
NONHDL: 128.67
Total CHOL/HDL Ratio: 4
Triglycerides: 240 mg/dL — ABNORMAL HIGH (ref 0.0–149.0)
VLDL: 48 mg/dL — AB (ref 0.0–40.0)

## 2018-04-29 MED ORDER — ZOLPIDEM TARTRATE 10 MG PO TABS: 10.0000 mg | ORAL_TABLET | Freq: Every evening | ORAL | 0 refills | Status: DC | PRN

## 2018-04-29 NOTE — Progress Notes (Signed)
Subjective:    Patient ID: Melissa [name], female    DOB: Aug 01, 1965, 52 y.o.   MRN: [id number]  HPI  Melissa [name] is a 52 year old female who presents today for follow up.  1) Type 2 Diabetes: Following with endocrinology. Last A1C of 10.1 in late October 2019. She is managed on Toujeo, Trulicity, and Metformin. She recently started back on Toujeo one week ago.   Wt Readings from Last 3 Encounters:  04/29/18 244 lb (110.7 kg)  04/07/18 242 lb (109.8 kg)  12/20/17 247 lb 9.6 oz (112.3 kg)    2) Insomnia: Currently managed on Ambien 10 mg. Overall doing "ok" on Ambien. Mostly struggles with staying asleep rather than falling asleep. She takes her Ambien at 10 pm, in bed by 10:30 pm. Takes 5-15 minutes to fall asleep, will sometimes wake around 12 am-4 am, can mostly fall back asleep.   3) Essential Hypertension: Currently managed on Bystolic 10 mg and Micardis 40 mg. She denies chest pain, dizziness, shortness of breath.   BP Readings from Last 3 Encounters:  04/29/18 110/70  04/07/18 112/70  12/20/17 110/62     Review of Systems  Eyes: Negative for visual disturbance.  Respiratory: Negative for shortness of breath.   Cardiovascular: Negative for chest pain.  Neurological: Negative for dizziness and headaches.  Psychiatric/Behavioral:       See HPI       Past Medical History:  Diagnosis Date  . Asthma   . Complication of anesthesia   . Diabetes mellitus without complication ([place])    Type II  . Dysrhythmia    Tachycardia  . GERD (gastroesophageal reflux disease)   . Pneumonia    2014- 'Walking"  . Shortness of breath dyspnea    with exertion     Social History   Socioeconomic History  . Marital status: Married    Spouse name: Not on file  . Number of children: Not on file  . Years of education: Not on file  . Highest education level: Not on file  Occupational History  . Not on file  Social Needs  . Financial resource strain: Not on file  . Food insecurity:    Worry: Not on file    Inability: Not on file  . Transportation needs:    Medical: Not on file    Non-medical: Not on file  Tobacco Use  . Smoking status: Never Smoker  . Smokeless tobacco: Never Used  Substance and Sexual Activity  . Alcohol use: Yes    Alcohol/week: 0.0 standard drinks    Comment: rare  . Drug use: No  . Sexual activity: Not on file  Lifestyle  . Physical activity:    Days per week: Not on file    Minutes per session: Not on file  . Stress: Not on file  Relationships  . Social connections:    Talks on phone: Not on file    Gets together: Not on file    Attends religious service: Not on file    Active member of club or organization: Not on file    Attends meetings of clubs or organizations: Not on file    Relationship status: Not on file  . Intimate partner violence:    Fear of current or ex partner: Not on file    Emotionally abused: Not on file    Physically abused: Not on file    Forced sexual activity: Not on file  Other Topics Concern  . Not  on file  Social History Narrative   Married.   1 daughter.   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     Past Surgical History:  Procedure Laterality Date  . CHOLECYSTECTOMY  1997  . COLONOSCOPY  2015  . CYSTOSCOPY     age 68- stretched stem of bladder  . 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 SHOULDER ARTHROSCOPY Left 11/20/2014   Procedure: ARTHROSCOPY SHOULDER WITH MUA, ROTATOR INTERVAL RELEASE;  Surgeon: Meredith Pel, MD;  Location: [facility];  Service: Orthopedics;  Laterality: Left;  LEFT SHOULDER MUA, DOA, ROTATOR INTERVAL RELEASE.  . TONSILLECTOMY  2010    Family History  Adopted: Yes  Problem Relation Age of Onset  . Other Unknown        adopted    Allergies  Allergen Reactions  . Amoxicillin Anaphylaxis and Other (See Comments)    Has patient had a PCN reaction causing immediate rash, facial/tongue/throat swelling, SOB or lightheadedness with hypotension:  Yes Has patient had a PCN reaction causing severe rash involving mucus membranes or skin necrosis: No Has patient had a PCN reaction that required hospitalization No Has patient had a PCN reaction occurring within the last 10 years: No If all of the above answers are "NO", then may proceed with Cephalosporin use.  . Ciprofloxacin Anaphylaxis  . Erythromycin Anaphylaxis  . Other Anaphylaxis    Pt states that she can only take Avelox, Levaquin, and Zithromax.    Marland Kitchen Penicillins Anaphylaxis and Other (See Comments)    Has patient had a PCN reaction causing immediate rash, facial/tongue/throat swelling, SOB or lightheadedness with hypotension: Yes Has patient had a PCN reaction causing severe rash involving mucus membranes or skin necrosis: No Has patient had a PCN reaction that required hospitalization No Has patient had a PCN reaction occurring within the last 10 years: No If all of the above answers are "NO", then may proceed with Cephalosporin use.  . Sulfa Antibiotics Anaphylaxis  . Vicodin [Hydrocodone-Acetaminophen] Itching    Current Outpatient Medications on File Prior to Visit  Medication Sig Dispense Refill  . albuterol (PROVENTIL HFA;VENTOLIN HFA) 108 (90 Base) MCG/ACT inhaler Inhale 2 puffs into the lungs every 6 (six) hours as needed for wheezing or shortness of breath. 1 Inhaler 2  . blood glucose meter kit and supplies KIT Dispense based on patient and insurance preference. Use 1x a day. (FOR ICD-9 250.00, 250.01). 1 each 0  . BYSTOLIC 10 MG tablet TAKE 1 TABLET DAILY (MUST SCHEDULE OFFICE VISIT) 90 tablet 0  . Dulaglutide (TRULICITY) 1.5 RX/4.5OP SOPN Inject 1.5 mg into the skin once a week. 12 pen 3  . glucose blood (FREESTYLE LITE) test strip Use as instructed, test blood sugar once a day 100 each 12  . Insulin Glargine (TOUJEO MAX SOLOSTAR) 300 UNIT/ML SOPN Inject 34 Units into the skin daily after supper. 9 pen 3  . Insulin Pen Needle 32G X 4 MM MISC Use 1x a day 100 each 3   . Lancets MISC Use 1x a day 100 each 3  . medroxyPROGESTERone (DEPO-PROVERA) 150 MG/ML injection Inject 150 mg into the muscle every 3 (three) months.    . metFORMIN (GLUCOPHAGE) 1000 MG tablet Take 1 tablet (1,000 mg total) by mouth 2 (two) times daily with a meal. 180 tablet 3  . MICARDIS 40 MG tablet TAKE 1 TABLET AT BEDTIME MUST SCHEDULE OFFICE VISIT 90 tablet 0  . zolpidem (AMBIEN) 10  MG tablet Take 1 tablet (10 mg total) by mouth at bedtime as needed for sleep. 30 tablet 0   No current facility-administered medications on file prior to visit.     BP 110/70   Pulse 97   Temp (!) 97.5 F (36.4 C) (Oral)   Ht 5' 2" (1.575 m)   Wt 244 lb (110.7 kg)   SpO2 98%   BMI 44.63 kg/m    Objective:   Physical Exam  Constitutional: She appears well-nourished.  Neck: Neck supple.  Cardiovascular: Normal rate and regular rhythm.  Respiratory: Effort normal and breath sounds normal.  Skin: Skin is warm and dry.  Psychiatric: She has a normal mood and affect.           Assessment & Plan:

## 2018-04-29 NOTE — Patient Instructions (Signed)
Stop by the lab prior to leaving today. I will notify you of your results once received.   Start exercising. You should be getting 150 minutes of moderate intensity exercise weekly.  It's important to improve your diet by reducing consumption of fast food, fried food, processed snack foods, sugary drinks. Increase consumption of fresh vegetables and fruits, whole grains, water.  Ensure you are drinking 64 ounces of water daily.  I sent refills of Ambien to your pharmacy.  Follow up with Dr. Elvera LennoxGherghe as scheduled.   It was a pleasure to see you today!

## 2018-04-29 NOTE — Assessment & Plan Note (Signed)
Visit in the ED in May for chest pain which turned out to be GERD. She is taking her husband's Dexilant as her insurance will not cover. This works the best. She will watch trigger foods.

## 2018-04-29 NOTE — Assessment & Plan Note (Signed)
Uncontrolled and now following with endocrinology. Continue current regimen. Discussed to work on her diet, start regular exercise.

## 2018-04-29 NOTE — Assessment & Plan Note (Signed)
Overall doing ok on Ambien, refills sent to pharmacy.

## 2018-04-29 NOTE — Assessment & Plan Note (Signed)
Repeat lipids pending. LDL goal <100.

## 2018-05-19 ENCOUNTER — Ambulatory Visit (INDEPENDENT_AMBULATORY_CARE_PROVIDER_SITE_OTHER): Admitting: Primary Care

## 2018-05-19 ENCOUNTER — Encounter: Payer: Self-pay | Admitting: Primary Care

## 2018-05-19 VITALS — BP 116/72 | HR 84 | Temp 98.0°F | Ht 62.0 in | Wt 242.8 lb

## 2018-05-19 DIAGNOSIS — J069 Acute upper respiratory infection, unspecified: Secondary | ICD-10-CM | POA: Diagnosis not present

## 2018-05-19 DIAGNOSIS — R05 Cough: Secondary | ICD-10-CM

## 2018-05-19 DIAGNOSIS — R059 Cough, unspecified: Secondary | ICD-10-CM

## 2018-05-19 LAB — POC INFLUENZA A&B (BINAX/QUICKVUE)
INFLUENZA B, POC: NEGATIVE
Influenza A, POC: NEGATIVE

## 2018-05-19 NOTE — Addendum Note (Signed)
Addended by: Tawnya CrookSAMBATH, Teneka Malmberg on: 05/19/2018 08:30 AM   Modules accepted: Orders

## 2018-05-19 NOTE — Patient Instructions (Signed)
Your symptoms are representative of a viral illness which will resolve on its own over time. Our goal is to treat your symptoms in order to aid your body in the healing process and to make you more comfortable.   Nasal Congestion/Ear Pressure/Sinus Pressure: Try using Flonase (fluticasone) nasal spray. Instill 1 spray in each nostril twice daily.   Cough/Congestion: Try taking Mucinex DM. This will help loosen up the mucous in your chest. Ensure you take this medication with a full glass of water.  Please notify me if you develop persistent fevers of 101, notice increased fatigue or weakness, and/or feel worse after 1 week of onset of symptoms.   Increase consumption of water intake and rest.  It was a pleasure to see you today!

## 2018-05-19 NOTE — Progress Notes (Signed)
Subjective:    Patient ID: Melissa [name], female    DOB: June 12, 1966, 52 y.o.   MRN: [id number]  HPI  Melissa [name] is a 52 year old female with a history of GERD, Type 2 diabetes who presents today with a chief complaint of cough.  Exposed to influenza one week ago. Cough, chest congestion, rhinorrhea, body aches. Sudden onset beginning yesterday, felt like she was hit with a ton of bricks. Believes she was running a fevers last night due to feeling flushed. She's not taken anything OTC for her symptoms.   Review of Systems  Constitutional: Positive for chills. Negative for fever.  HENT: Positive for congestion, sinus pressure and sore throat.   Respiratory: Positive for cough.   Cardiovascular: Negative for chest pain.       Past Medical History:  Diagnosis Date  . Asthma   . Complication of anesthesia   . Diabetes mellitus without complication ([facility])    Type II  . Dysrhythmia    Tachycardia  . GERD (gastroesophageal reflux disease)   . Pneumonia    2014- 'Walking"  . Shortness of breath dyspnea    with exertion     Social History   Socioeconomic History  . Marital status: Married    Spouse name: Not on file  . Number of children: Not on file  . Years of education: Not on file  . Highest education level: Not on file  Occupational History  . Not on file  Social Needs  . Financial resource strain: Not on file  . Food insecurity:    Worry: Not on file    Inability: Not on file  . Transportation needs:    Medical: Not on file    Non-medical: Not on file  Tobacco Use  . Smoking status: Never Smoker  . Smokeless tobacco: Never Used  Substance and Sexual Activity  . Alcohol use: Yes    Alcohol/week: 0.0 standard drinks    Comment: rare  . Drug use: No  . Sexual activity: Not on file  Lifestyle  . Physical activity:    Days per week: Not on file    Minutes per session: Not on file  . Stress: Not on file  Relationships  . Social connections:    Talks on phone:  Not on file    Gets together: Not on file    Attends religious service: Not on file    Active member of club or organization: Not on file    Attends meetings of clubs or organizations: Not on file    Relationship status: Not on file  . Intimate partner violence:    Fear of current or ex partner: Not on file    Emotionally abused: Not on file    Physically abused: Not on file    Forced sexual activity: Not on file  Other Topics Concern  . Not on file  Social History Narrative   Married.   1 daughter.   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     Past Surgical History:  Procedure Laterality Date  . CHOLECYSTECTOMY  1997  . COLONOSCOPY  2015  . CYSTOSCOPY     age 32- stretched stem of bladder  . 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 SHOULDER ARTHROSCOPY Left 11/20/2014   Procedure: ARTHROSCOPY SHOULDER WITH MUA, ROTATOR INTERVAL RELEASE;  Surgeon: Meredith Pel, MD;  Location: [facility];  Service: Orthopedics;  Laterality: Left;  LEFT SHOULDER MUA, DOA, ROTATOR INTERVAL RELEASE.  . TONSILLECTOMY  2010    Family History  Adopted: Yes  Problem Relation Age of Onset  . Other Unknown        adopted    Allergies  Allergen Reactions  . Amoxicillin Anaphylaxis and Other (See Comments)    Has patient had a PCN reaction causing immediate rash, facial/tongue/throat swelling, SOB or lightheadedness with hypotension: Yes Has patient had a PCN reaction causing severe rash involving mucus membranes or skin necrosis: No Has patient had a PCN reaction that required hospitalization No Has patient had a PCN reaction occurring within the last 10 years: No If all of the above answers are "NO", then may proceed with Cephalosporin use.  . Ciprofloxacin Anaphylaxis  . Erythromycin Anaphylaxis  . Other Anaphylaxis    Pt states that she can only take Avelox, Levaquin, and Zithromax.    Marland Kitchen Penicillins Anaphylaxis and Other (See Comments)    Has patient had a PCN reaction  causing immediate rash, facial/tongue/throat swelling, SOB or lightheadedness with hypotension: Yes Has patient had a PCN reaction causing severe rash involving mucus membranes or skin necrosis: No Has patient had a PCN reaction that required hospitalization No Has patient had a PCN reaction occurring within the last 10 years: No If all of the above answers are "NO", then may proceed with Cephalosporin use.  . Sulfa Antibiotics Anaphylaxis  . Vicodin [Hydrocodone-Acetaminophen] Itching    Current Outpatient Medications on File Prior to Visit  Medication Sig Dispense Refill  . albuterol (PROVENTIL HFA;VENTOLIN HFA) 108 (90 Base) MCG/ACT inhaler Inhale 2 puffs into the lungs every 6 (six) hours as needed for wheezing or shortness of breath. 1 Inhaler 2  . blood glucose meter kit and supplies KIT Dispense based on patient and insurance preference. Use 1x a day. (FOR ICD-9 250.00, 250.01). 1 each 0  . BYSTOLIC 10 MG tablet TAKE 1 TABLET DAILY (MUST SCHEDULE OFFICE VISIT) 90 tablet 0  . Dulaglutide (TRULICITY) 1.5 WE/3.1VQ SOPN Inject 1.5 mg into the skin once a week. 12 pen 3  . glucose blood (FREESTYLE LITE) test strip Use as instructed, test blood sugar once a day 100 each 12  . Insulin Glargine (TOUJEO MAX SOLOSTAR) 300 UNIT/ML SOPN Inject 34 Units into the skin daily after supper. 9 pen 3  . Insulin Pen Needle 32G X 4 MM MISC Use 1x a day 100 each 3  . Lancets MISC Use 1x a day 100 each 3  . medroxyPROGESTERone (DEPO-PROVERA) 150 MG/ML injection Inject 150 mg into the muscle every 3 (three) months.    . metFORMIN (GLUCOPHAGE) 1000 MG tablet Take 1 tablet (1,000 mg total) by mouth 2 (two) times daily with a meal. 180 tablet 3  . MICARDIS 40 MG tablet TAKE 1 TABLET AT BEDTIME MUST SCHEDULE OFFICE VISIT 90 tablet 0  . zolpidem (AMBIEN) 10 MG tablet Take 1 tablet (10 mg total) by mouth at bedtime as needed for sleep. 90 tablet 0   No current facility-administered medications on file prior to  visit.     BP 116/72   Pulse 84   Temp 98 F (36.7 C) (Oral)   Ht _0  (1.575 m)   Wt 242 lb 12 oz (110.1 kg)   SpO2 97%   BMI 44.40 kg/m    Objective:   Physical Exam  Constitutional: She appears well-nourished. She does not appear ill.  HENT:  Right Ear: Tympanic membrane and ear canal normal.  Left Ear:  Tympanic membrane and ear canal normal.  Nose: Mucosal edema present. Right sinus exhibits maxillary sinus tenderness. Right sinus exhibits no frontal sinus tenderness. Left sinus exhibits no maxillary sinus tenderness and no frontal sinus tenderness.  Mouth/Throat: Oropharynx is clear and moist.  Neck: Neck supple.  Cardiovascular: Normal rate and regular rhythm.  Respiratory: Effort normal and breath sounds normal. She has no wheezes.  Congested cough on exam  Skin: Skin is warm and dry.           Assessment & Plan:  URI:  Sudden onset of URI symptoms yesterday. Exam today consistent for viral involvement. Rapid Flu: negative Discussed use of Robitussin/Delsym, Mucinex, Ibuprofen. Return precautions provided.  Pleas Koch, NP

## 2018-06-17 ENCOUNTER — Other Ambulatory Visit: Payer: Self-pay | Admitting: Primary Care

## 2018-06-17 ENCOUNTER — Telehealth: Payer: Self-pay | Admitting: Primary Care

## 2018-06-17 DIAGNOSIS — R000 Tachycardia, unspecified: Secondary | ICD-10-CM

## 2018-06-17 DIAGNOSIS — I10 Essential (primary) hypertension: Secondary | ICD-10-CM

## 2018-06-17 NOTE — Telephone Encounter (Signed)
Last prescribed on 03/18/2018  #90  with 0  Refills on both Rx Last office visit on 05/19/2018 for acute. No future appointment

## 2018-06-17 NOTE — Telephone Encounter (Signed)
Noted.  Refill sent to pharmacy. 

## 2018-06-17 NOTE — Telephone Encounter (Signed)
Pt called office stating she is almost out of the of the Telmisartan and Bebivolol. Pt says she needs the medication and that it takes an extra 14 days just for [facility] to fill it. I did let the pt know the refill request had been received and we have 48-72 hours to refill. Pt is wondering if Jae Dire can expedite it.

## 2018-06-22 ENCOUNTER — Other Ambulatory Visit: Payer: Self-pay

## 2018-06-22 MED ORDER — GLUCOSE BLOOD VI STRP: ORAL_STRIP | 12 refills | Status: AC

## 2018-06-27 ENCOUNTER — Telehealth: Payer: Self-pay

## 2018-06-27 ENCOUNTER — Other Ambulatory Visit (INDEPENDENT_AMBULATORY_CARE_PROVIDER_SITE_OTHER)

## 2018-06-27 DIAGNOSIS — R748 Abnormal levels of other serum enzymes: Secondary | ICD-10-CM | POA: Diagnosis not present

## 2018-06-27 NOTE — Telephone Encounter (Signed)
I spoke with pt; pt can only come in today. Pt usually works M-F and is not sure when she will have another day off during the week. Offered pt late appt at 6 pm with Dr Reece Agar on 06/29/18 but pt could not schedule due to work schedule. Pt thinks she may have to put up with the pain but would like to have lab test done to ck out the liver function today. Pt does not think she needs to go to UC.Pt request cb.

## 2018-06-27 NOTE — Telephone Encounter (Signed)
Please notify patient that she can come in anytime at her convenience for her lab draw, it's already been ordered.

## 2018-06-27 NOTE — Telephone Encounter (Signed)
Pt has RUQ pain on and off for awhile. Pain level now is 1 when pain level is worse goes to 3. For the last wk pain is constant. No fever. Pt said liver enzymes up last wk. 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 Pt request to be seen. Please advise. Pt request cb after Jae Dire reviews note.

## 2018-06-27 NOTE — Telephone Encounter (Signed)
I cannot add her on today. Please schedule her for a visit with me for tomorrow or have her seen by another provider.

## 2018-06-27 NOTE — Telephone Encounter (Signed)
I spoke with pt and she will come today by 4:30 pm for hepatic function panel.

## 2018-06-28 ENCOUNTER — Other Ambulatory Visit: Payer: Self-pay | Admitting: Primary Care

## 2018-06-28 DIAGNOSIS — R945 Abnormal results of liver function studies: Principal | ICD-10-CM

## 2018-06-28 DIAGNOSIS — R7989 Other specified abnormal findings of blood chemistry: Secondary | ICD-10-CM

## 2018-06-28 LAB — HEPATIC FUNCTION PANEL
ALK PHOS: 74 U/L (ref 39–117)
ALT: 58 U/L — ABNORMAL HIGH (ref 0–35)
AST: 34 U/L (ref 0–37)
Albumin: 4.2 g/dL (ref 3.5–5.2)
Bilirubin, Direct: 0.1 mg/dL (ref 0.0–0.3)
Total Bilirubin: 0.4 mg/dL (ref 0.2–1.2)
Total Protein: 7.6 g/dL (ref 6.0–8.3)

## 2018-07-07 ENCOUNTER — Ambulatory Visit (INDEPENDENT_AMBULATORY_CARE_PROVIDER_SITE_OTHER): Admitting: Internal Medicine

## 2018-07-07 ENCOUNTER — Encounter: Payer: Self-pay | Admitting: Internal Medicine

## 2018-07-07 VITALS — BP 114/76 | HR 84 | Temp 98.0°F | Wt 238.0 lb

## 2018-07-07 DIAGNOSIS — J00 Acute nasopharyngitis [common cold]: Secondary | ICD-10-CM | POA: Diagnosis not present

## 2018-07-07 MED ORDER — DOXYCYCLINE HYCLATE 100 MG PO TABS: 100.0000 mg | ORAL_TABLET | Freq: Two times a day (BID) | ORAL | 0 refills | Status: DC

## 2018-07-07 MED ORDER — ALBUTEROL SULFATE HFA 108 (90 BASE) MCG/ACT IN AERS: 2.0000 | INHALATION_SPRAY | Freq: Four times a day (QID) | RESPIRATORY_TRACT | 2 refills | Status: DC | PRN

## 2018-07-07 NOTE — Patient Instructions (Signed)

## 2018-07-07 NOTE — Progress Notes (Signed)
HPI  Pt presents to the clinic today with c/o runny nose, ear fullness and cough. She reports this started 2 months ago but worsened in the last week. She is blowing clear mucous out of her nose. She denies ear pain or decreased hearing. The cough is productive of green mucous. She denies nasal congestion, sore throat or shortness of breath. She denies fever, chills or body ache. She has tried Mucinex, Delsym and her Albuterol inhaler with minimal relief. She has a history of asthma. She has had pneumonia in the past. Her pneumonia vaccine is UTD.  Review of Systems      Past Medical History:  Diagnosis Date  . Asthma   . Complication of anesthesia   . Diabetes mellitus without complication ([facility])    Type II  . Dysrhythmia    Tachycardia  . GERD (gastroesophageal reflux disease)   . Pneumonia    2014- 'Walking"  . Shortness of breath dyspnea    with exertion    Family History  Adopted: Yes  Problem Relation Age of Onset  . Other Unknown        adopted    Social History   Socioeconomic History  . Marital status: Married    Spouse name: Not on file  . Number of children: Not on file  . Years of education: Not on file  . Highest education level: Not on file  Occupational History  . Not on file  Social Needs  . Financial resource strain: Not on file  . Food insecurity:    Worry: Not on file    Inability: Not on file  . Transportation needs:    Medical: Not on file    Non-medical: Not on file  Tobacco Use  . Smoking status: Never Smoker  . Smokeless tobacco: Never Used  Substance and Sexual Activity  . Alcohol use: Yes    Alcohol/week: 0.0 standard drinks    Comment: rare  . Drug use: No  . Sexual activity: Not on file  Lifestyle  . Physical activity:    Days per week: Not on file    Minutes per session: Not on file  . Stress: Not on file  Relationships  . Social connections:    Talks on phone: Not on file    Gets together: Not on file    Attends religious  service: Not on file    Active member of club or organization: Not on file    Attends meetings of clubs or organizations: Not on file    Relationship status: Not on file  . Intimate partner violence:    Fear of current or ex partner: Not on file    Emotionally abused: Not on file    Physically abused: Not on file    Forced sexual activity: Not on file  Other Topics Concern  . Not on file  Social History Narrative   Married.   1 daughter.   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     Allergies  Allergen Reactions  . Amoxicillin Anaphylaxis and Other (See Comments)    Has patient had a PCN reaction causing immediate rash, facial/tongue/throat swelling, SOB or lightheadedness with hypotension: Yes Has patient had a PCN reaction causing severe rash involving mucus membranes or skin necrosis: No Has patient had a PCN reaction that required hospitalization No Has patient had a PCN reaction occurring within the last 10 years: No If all of the above answers are "NO",  then may proceed with Cephalosporin use.  . Ciprofloxacin Anaphylaxis  . Erythromycin Anaphylaxis  . Other Anaphylaxis    Pt states that she can only take Avelox, Levaquin, and Zithromax.    Marland Kitchen Penicillins Anaphylaxis and Other (See Comments)    Has patient had a PCN reaction causing immediate rash, facial/tongue/throat swelling, SOB or lightheadedness with hypotension: Yes Has patient had a PCN reaction causing severe rash involving mucus membranes or skin necrosis: No Has patient had a PCN reaction that required hospitalization No Has patient had a PCN reaction occurring within the last 10 years: No If all of the above answers are "NO", then may proceed with Cephalosporin use.  . Sulfa Antibiotics Anaphylaxis  . Vicodin [Hydrocodone-Acetaminophen] Itching     Constitutional:  Denies headache, fatigue, fever or abrupt weight changes.  HEENT:  Positive ear fullness, runny nose. Denies eye  redness, eye pain, pressure behind the eyes, facial pain, nasal congestion, ear pain, ringing in the ears, wax buildup, runny nose or sore throat. Respiratory: Positive cough. Denies difficulty breathing or shortness of breath.  Cardiovascular: Denies chest pain, chest tightness, palpitations or swelling in the hands or feet.   No other specific complaints in a complete review of systems (except as listed in HPI above).  Objective:   BP 114/76   Pulse 84   Temp 98 F (36.7 C) (Oral)   Wt 238 lb (108 kg)   SpO2 98%   BMI 43.53 kg/m  Wt Readings from Last 3 Encounters:  07/07/18 238 lb (108 kg)  05/19/18 242 lb 12 oz (110.1 kg)  04/29/18 244 lb (110.7 kg)     General: Appears her stated age, obese, in NAD. HEENT: Head: normal shape and size, maxillary sinus tenderness noted;Ears: Tm's gray and intact, normal light reflex; Nose: mucosa pink and moist, septum midline; Throat/Mouth:  Teeth present, mucosa erythematous and moist, no exudate noted, no lesions or ulcerations noted.  Neck: No cervical lymphadenopathy.  Cardiovascular: Normal rate and rhythm. S1,S2 noted.  No murmur, rubs or gallops noted.  Pulmonary/Chest: Normal effort and positive vesicular breath sounds. No respiratory distress. No wheezes, rales or ronchi noted.       Assessment & Plan:   Upper Respiratory Infection:  Get some rest and drink plenty of water Do salt water gargles for the sore throat eRx for Doxycycline 100 mg BID x 10 days 80 mg Depo IM today Refilled Albuterol inhaler today Continue Delsym as needed for cough  RTC as needed or if symptoms persist.   Nicki Reaper, NP

## 2018-08-01 ENCOUNTER — Ambulatory Visit (INDEPENDENT_AMBULATORY_CARE_PROVIDER_SITE_OTHER): Admitting: Internal Medicine

## 2018-08-01 ENCOUNTER — Encounter: Payer: Self-pay | Admitting: Internal Medicine

## 2018-08-01 VITALS — BP 120/70 | HR 74 | Ht 62.0 in | Wt 231.0 lb

## 2018-08-01 DIAGNOSIS — E1165 Type 2 diabetes mellitus with hyperglycemia: Secondary | ICD-10-CM

## 2018-08-01 DIAGNOSIS — Z6841 Body Mass Index (BMI) 40.0 and over, adult: Secondary | ICD-10-CM | POA: Diagnosis not present

## 2018-08-01 DIAGNOSIS — G4700 Insomnia, unspecified: Secondary | ICD-10-CM

## 2018-08-01 DIAGNOSIS — E785 Hyperlipidemia, unspecified: Secondary | ICD-10-CM | POA: Diagnosis not present

## 2018-08-01 LAB — POCT GLYCOSYLATED HEMOGLOBIN (HGB A1C): Hemoglobin A1C: 6.7 % — AB (ref 4.0–5.6)

## 2018-08-01 NOTE — Progress Notes (Addendum)
Patient ID: Melissa [name], female   DOB: 02/22/1966, 53 y.o.   MRN: [id number]   HPI: Melissa [name] is a 53 y.o.-year-old female, returning for f/u for DM2, dx in 1997, non-insulin-dependent, uncontrolled, without long-term complications. Last OV 3.5 months ago.  Since 05/2018 >> reduced starches, no bread, pasta, sweets. Sugars improved significantly!  Last hemoglobin A1c was: Lab Results  Component Value Date   HGBA1C 10.1 (A) 04/07/2018   HGBA1C 10.8 (A) 12/20/2017   HGBA1C 7.4 (H) 05/12/2017  1997: HbA1c 11%  Pt was on a regimen of: - JanuMet 50-1000 mg 2x a day, with meals She was on Toujeo  - had a low: 53 after food. Prev. On insulin  - 2010.  Now on: - Metformin 1000 mg 2x a day with meals - Trulicity 1.5 mg weekly -nausea at the beginning, now resolved - Toujeo 12 units at bedtime-started 03/2018  Pt checks sugars 1x a day: - am: n/c >> 235 >> 120-141 - 2h after b'fast: n/c - before lunch: n/c - 2h after lunch: n/c - before dinner: n/c >> 206 >> 88 - 2h after dinner: n/c >> 253-337 >> 85-117 - bedtime: n/c >> 197, 288 - nighttime: n/c Lowest sugar was 50s in the past >> 148 >> 85; she has hypoglycemia awareness in the 70s. Highest sugar was 308 >> 337 >> 141.  Glucometer: RelioN  Pt's meals are -mostly fast food - Breakfast: skips b'fast usually - Lunch: sandwich, fast food - Dinner: cooked meal - Snacks: many  - carbs   -No CKD; last BUN/creatinine:  Lab Results  Component Value Date   BUN 13 04/29/2018   BUN 14 10/31/2017   CREATININE 0.89 04/29/2018   CREATININE 0.81 10/31/2017  On telmisartan.  -+ HL; last set of lipids: Lab Results  Component Value Date   CHOL 172 04/29/2018   HDL 43.40 04/29/2018   LDLCALC 122 (H) 05/12/2017   LDLDIRECT 109.0 04/29/2018   TRIG 240.0 (H) 04/29/2018   CHOLHDL 4 04/29/2018  Not on a statin.  She has a history of transaminitis, improving: Lab Results  Component Value Date   ALT 58 (H) 06/27/2018   AST 34  06/27/2018   ALKPHOS 74 06/27/2018   BILITOT 0.4 06/27/2018   - last eye exam was in 2019: No DR  -She denies numbness and tingling in her feet.  Pt is adopted - ? FH of DM.  She lost 55 lbs last year ([employer]), then gained back 40 pounds.  Since last visit, she lost 6 pounds.  She also has fatty liver, GERD.  She tells me that she has allergies to most antibiotics, but she can take Levaquin, Z-Pack, Avelox, doxycycline.  ROS: Constitutional: no weight gain/+ weight loss, no fatigue, no subjective hyperthermia, no subjective hypothermia Eyes: no blurry vision, no xerophthalmia ENT: no sore throat, no nodules palpated in neck, no dysphagia, no odynophagia, no hoarseness Cardiovascular: no CP/no SOB/no palpitations/no leg swelling Respiratory: no cough/no SOB/no wheezing Gastrointestinal: no N/no V/no D/no C/no acid reflux Musculoskeletal: no muscle aches/no joint aches Skin: no rashes, no hair loss Neurological: no tremors/no numbness/no tingling/no dizziness  I reviewed pt's medications, allergies, PMH, social hx, family hx, and changes were documented in the history of present illness. Otherwise, unchanged from my initial visit note.  Past Medical History:  Diagnosis Date  . Asthma   . Complication of anesthesia   . Diabetes mellitus without complication ([place])    Type II  . Dysrhythmia  Tachycardia  . GERD (gastroesophageal reflux disease)   . Pneumonia    2014- 'Walking"  . Shortness of breath dyspnea    with exertion   Past Surgical History:  Procedure Laterality Date  . CHOLECYSTECTOMY  1997  . COLONOSCOPY  2015  . CYSTOSCOPY     age 48- stretched stem of bladder  . 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 SHOULDER ARTHROSCOPY Left 11/20/2014   Procedure: ARTHROSCOPY SHOULDER WITH MUA, ROTATOR INTERVAL RELEASE;  Surgeon: Meredith Pel, MD;  Location: [facility];  Service: Orthopedics;  Laterality: Left;  LEFT SHOULDER MUA, DOA, ROTATOR INTERVAL RELEASE.  .  TONSILLECTOMY  2010   Social History   Socioeconomic History  . Marital status: Married    Spouse name: Not on file  . Number of children: 1  . Years of education: Not on file  . Highest education level: Not on file  Occupational History  .   Tobacco Use  . Smoking status: Never Smoker  . Smokeless tobacco: Never Used  Substance and Sexual Activity  . Alcohol use: Yes    Alcohol/week: 0.0 oz    Comment: rare  . Drug use: No      Social History Narrative   Married.   1 daughter.   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    Current Outpatient Medications on File Prior to Visit  Medication Sig Dispense Refill  . albuterol (PROVENTIL HFA;VENTOLIN HFA) 108 (90 Base) MCG/ACT inhaler Inhale 2 puffs into the lungs every 6 (six) hours as needed for wheezing or shortness of breath. 1 Inhaler 2  . blood glucose meter kit and supplies KIT Dispense based on patient and insurance preference. Use 1x a day. (FOR ICD-9 250.00, 250.01). 1 each 0  . doxycycline (VIBRA-TABS) 100 MG tablet Take 1 tablet (100 mg total) by mouth 2 (two) times daily. 20 tablet 0  . Dulaglutide (TRULICITY) 1.5 CX/4.4YJ SOPN Inject 1.5 mg into the skin once a week. 12 pen 3  . glucose blood (FREESTYLE LITE) test strip Use as instructed, test blood sugar once a day 100 each 12  . Insulin Glargine (TOUJEO MAX SOLOSTAR) 300 UNIT/ML SOPN Inject 34 Units into the skin daily after supper. 9 pen 3  . Insulin Pen Needle 32G X 4 MM MISC Use 1x a day 100 each 3  . Lancets MISC Use 1x a day 100 each 3  . medroxyPROGESTERone (DEPO-PROVERA) 150 MG/ML injection Inject 150 mg into the muscle every 3 (three) months.    . metFORMIN (GLUCOPHAGE) 1000 MG tablet Take 1 tablet (1,000 mg total) by mouth 2 (two) times daily with a meal. 180 tablet 3  . nebivolol (BYSTOLIC) 10 MG tablet Take 1 tablet (10 mg total) by mouth daily. 90 tablet 3  . telmisartan (MICARDIS) 40 MG tablet Take 1 tablet (40 mg total) by mouth  daily. For blood pressure. 90 tablet 3  . zolpidem (AMBIEN) 10 MG tablet Take 1 tablet (10 mg total) by mouth at bedtime as needed for sleep. 90 tablet 0   No current facility-administered medications on file prior to visit.    Allergies  Allergen Reactions  . Amoxicillin Anaphylaxis and Other (See Comments)    Has patient had a PCN reaction causing immediate rash, facial/tongue/throat swelling, SOB or lightheadedness with hypotension: Yes Has patient had a PCN reaction causing severe rash involving mucus membranes or skin necrosis: No Has patient had a PCN reaction that  required hospitalization No Has patient had a PCN reaction occurring within the last 10 years: No If all of the above answers are "NO", then may proceed with Cephalosporin use.  . Ciprofloxacin Anaphylaxis  . Erythromycin Anaphylaxis  . Other Anaphylaxis    Pt states that she can only take Avelox, Levaquin, and Zithromax.    Marland Kitchen Penicillins Anaphylaxis and Other (See Comments)    Has patient had a PCN reaction causing immediate rash, facial/tongue/throat swelling, SOB or lightheadedness with hypotension: Yes Has patient had a PCN reaction causing severe rash involving mucus membranes or skin necrosis: No Has patient had a PCN reaction that required hospitalization No Has patient had a PCN reaction occurring within the last 10 years: No If all of the above answers are "NO", then may proceed with Cephalosporin use.  . Sulfa Antibiotics Anaphylaxis  . Vicodin [Hydrocodone-Acetaminophen] Itching   Family History  Adopted: Yes  Problem Relation Age of Onset  . Other Unknown        adopted    PE: BP 120/70   Pulse 74   Ht _0  (1.575 m)   Wt 231 lb (104.8 kg)   SpO2 98%   BMI 42.25 kg/m  Wt Readings from Last 3 Encounters:  08/01/18 231 lb (104.8 kg)  07/07/18 238 lb (108 kg)  05/19/18 242 lb 12 oz (110.1 kg)   Constitutional: overweight, in NAD Eyes: PERRLA, EOMI, no exophthalmos ENT: moist mucous membranes,  no thyromegaly, no cervical lymphadenopathy Cardiovascular: RRR, No MRG Respiratory: CTA B Gastrointestinal: abdomen soft, NT, ND, BS+ Musculoskeletal: no deformities, strength intact in all 4 Skin: moist, warm, no rashes Neurological: no tremor with outstretched hands, DTR normal in all 4  ASSESSMENT: 1. DM2, non-insulin-dependent, uncontrolled, without long-term complications, but with hyperglycemia  2.  Hyperlipidemia  3.  Obesity  PLAN:  1. Patient with longstanding, uncontrolled, type 2 diabetes, on oral antidiabetic regimen and GLP-1 receptor agonist.  We switch from Januvia to Trulicity last year.  At that time, she refused a referral to nutrition.  HbA1c improved slightly from 10.8 to 10.1% at last visit.  At that time, sugars are still very high, in the 200s and 300s, despite using metformin and Trulicity so we had to add long-acting insulin.  We also discussed that we will need to check her for type 1 diabetes when sugars improved. -At this visit, reviewing her meter download, the sugars are radically improved.  They are almost all of the medical, with 3 exceptions in the morning.  Highest blood sugar in the last 1 to 2 months: 141. -She tells me that she did start insulin but at a lower dose than recommended, 12 units.  She did not change this but started to change her diet significantly by limiting fatty foods, eating many vegetables and also limiting starches.  She does not eat any sweets.  I congratulated her for this change in diet and strongly advised her to continue. -At this visit we will check her for type 1 diabetes (which I doubt, but would like to make sure before we stop her insulin). -I did advise her to reduce the Toujeo dose and in approximately a week, if sugars do not worsen, to stop it completely. - I suggested to:  Patient Instructions  Please continue: - Metformin 1000 mg 2x a day with meals - Trulicity 1.5 mg weekly  Please decrease: - Toujeo 7 units at  bedtime for the next week If the sugars remain controlled, stop the insulin.  Please come back for a follow-up appointment in 3-4 months.  - today, HbA1c is 6.7% (impressive decrease) - continue checking sugars at different times of the day - check 1-2x a day, rotating checks - advised for yearly eye exams >> she is UTD - Return to clinic in 3-4 mo with sugar log  2.  Hyperlipidemia - Reviewed latest lipid panel from 04/2018: LDL above goal, but improved, triglycerides high Lab Results  Component Value Date   CHOL 172 04/29/2018   HDL 43.40 04/29/2018   LDLCALC 122 (H) 05/12/2017   LDLDIRECT 109.0 04/29/2018   TRIG 240.0 (H) 04/29/2018   CHOLHDL 4 04/29/2018  -She is not on a statin.  3.  Obesity -Continue Trulicity which is also helping with weight loss -Reducing and hopefully even stopping insulin will greatly help with weight loss, also -She also changed her diet significantly (see above) -She lost 13 pounds since last visit!  Component     Latest Ref Rng & Units 08/01/2018  C-Peptide     0.80 - 3.85 ng/mL 2.53  Islet Cell Ab     Neg:<1:1 Negative  Glucose, Plasma     65 - 99 mg/dL 100 (H)  ZNT8 Antibodies     U/mL <15  Glutamic Acid Decarb Ab     <5 IU/mL <5   No sign of DM1. Philemon Kingdom, MD PhD [facility] Endocrinology

## 2018-08-01 NOTE — Patient Instructions (Addendum)
Please continue: - Metformin 1000 mg 2x a day with meals - Trulicity 1.5 mg weekly  Please decrease: - Toujeo 7 units at bedtime for the next week If the sugars remain controlled, stop the insulin.  Please come back for a follow-up appointment in 3-4 months.

## 2018-08-02 MED ORDER — ZOLPIDEM TARTRATE 10 MG PO TABS: 10.0000 mg | ORAL_TABLET | Freq: Every evening | ORAL | 0 refills | Status: DC | PRN

## 2018-08-03 LAB — GLUCOSE, FASTING: GLUCOSE, PLASMA: 100 mg/dL — AB (ref 65–99)

## 2018-08-03 LAB — GLUTAMIC ACID DECARBOXYLASE AUTO ABS: Glutamic Acid Decarb Ab: <5 IU/mL (ref <5)

## 2018-08-03 LAB — C-PEPTIDE: C-Peptide: 2.53 ng/mL (ref 0.80–3.85)

## 2018-08-03 MED ORDER — METHYLPREDNISOLONE ACETATE 80 MG/ML IJ SUSP
80.0000 mg | Freq: Once | INTRAMUSCULAR | Status: AC
  Administered 2018-07-07: 80 mg via INTRAMUSCULAR

## 2018-08-03 NOTE — Addendum Note (Signed)
Addended by: Roena Malady on: 08/03/2018 08:58 AM   Modules accepted: Orders

## 2018-08-10 LAB — ZNT8 ANTIBODIES: ZNT8 Antibodies: <15 U/mL

## 2018-08-10 LAB — ANTI-ISLET CELL ANTIBODY: ISLET CELL AB: NEGATIVE

## 2018-11-01 DIAGNOSIS — G4700 Insomnia, unspecified: Secondary | ICD-10-CM

## 2018-11-02 MED ORDER — ZOLPIDEM TARTRATE 10 MG PO TABS: 10.0000 mg | ORAL_TABLET | Freq: Every evening | ORAL | 0 refills | Status: DC | PRN

## 2018-11-17 ENCOUNTER — Ambulatory Visit: Admitting: Internal Medicine

## 2018-12-11 NOTE — Progress Notes (Signed)
Patient ID: [name], female   DOB: December 26, 1965, 53 y.o.   MRN: [id number]   HPI: ZABDI MIS is a 53 y.o.-year-old female, returning for f/u for DM2, dx in 1997, insulin-dependent, uncontrolled, without long-term complications. Last OV 4 months ago.  At last visit, her sugars improved dramatically after she started to improve her diet in 05/2018: Reduced starches, no bread, pasta, sweets. Since then her diet relaxed 03-09/2018, now restarted a more strict diet.  She continues herbal life shakes.   Last hemoglobin A1c was: Lab Results  Component Value Date   HGBA1C 6.7 (A) 08/01/2018   HGBA1C 10.1 (A) 04/07/2018   HGBA1C 10.8 (A) 12/20/2017  1997: HbA1c 11%  At last visit, investigation for type 1 diabetes was negative: Component     Latest Ref Rng & Units 08/01/2018  C-Peptide     0.80 - 3.85 ng/mL 2.53  Islet Cell Ab     Neg:<1:1 Negative  Glucose, Plasma     65 - 99 mg/dL 100 (H)  ZNT8 Antibodies     U/mL <15  Glutamic Acid Decarb Ab     <5 IU/mL <5   Pt was on a regimen of: - JanuMet 50-1000 mg 2x a day, with meals She was on Toujeo  - had a low: 53 after food. Prev. On insulin  - 2010.  Now on: - Metformin 1000 mg 2x a day with meals - Trulicity 1.5 mg weekly  - Toujeo 12 units at bedtime-started 03/2018 >> 7 >> 8-10 units (in 03-09/2018: 12-16 units).   Pt checks sugars 0-1x a day: - am: n/c >> 235 >> 120-141 >> 115-117 - 2h after b'fast: n/c - before lunch: n/c - 2h after lunch: n/c - before dinner: n/c >> 206 >> 88 >> n/c - 2h after dinner: 253-337 >> 85-117 >> n/c - bedtime: n/c >> 197, 288 >> 84, 100-112 - nighttime: n/c Lowest sugar was 50s in the past >> 148 >> 85 >> 84; she has hypoglycemia awareness in the 70s. Highest sugar was 308 >> 337 >> 141 >> 140 x1.  Glucometer: RelioN  -No CKD; last BUN/creatinine:  Lab Results  Component Value Date   BUN 13 04/29/2018   BUN 14 10/31/2017   CREATININE 0.89 04/29/2018   CREATININE 0.81 10/31/2017  On  telmisartan.  -+ HL; last set of lipids: Lab Results  Component Value Date   CHOL 172 04/29/2018   HDL 43.40 04/29/2018   LDLCALC 122 (H) 05/12/2017   LDLDIRECT 109.0 04/29/2018   TRIG 240.0 (H) 04/29/2018   CHOLHDL 4 04/29/2018  Not on a statin.  She has fatty liver + a history of transaminitis: Lab Results  Component Value Date   ALT 58 (H) 06/27/2018   AST 34 06/27/2018   ALKPHOS 74 06/27/2018   BILITOT 0.4 06/27/2018   - last eye exam was in 2019: No DR  -No numbness and tingling in her feet.  Pt is adopted - ? FH of DM.  She lost 55 lbs in 2019 ([employer]), then gained back 40 pounds.  She tells me that she has allergies to most antibiotics, but she can take Levaquin, Z-Pack, Avelox, doxycycline.  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  ROS: Constitutional: no weight gain/+ weight loss, no fatigue, no subjective hyperthermia, no subjective hypothermia Eyes: no blurry vision, no xerophthalmia ENT: no sore throat, no nodules palpated in neck, no dysphagia, no odynophagia, no hoarseness Cardiovascular: no CP/no SOB/no palpitations/no leg swelling Respiratory: no  cough/no SOB/no wheezing Gastrointestinal: no N/no V/no D/no C/no acid reflux Musculoskeletal: no muscle aches/no joint aches Skin: no rashes, no hair loss Neurological: no tremors/no numbness/no tingling/no dizziness  I reviewed pt's medications, allergies, PMH, social hx, family hx, and changes were documented in the history of present illness. Otherwise, unchanged from my initial visit note.  Past Medical History:  Diagnosis Date  . Asthma   . Complication of anesthesia   . Diabetes mellitus without complication ([place])    Type II  . Dysrhythmia    Tachycardia  . GERD (gastroesophageal reflux disease)   . Pneumonia    2014- 'Walking"  . Shortness of breath dyspnea    with exertion   Past Surgical History:  Procedure Laterality Date  . CHOLECYSTECTOMY  1997  . COLONOSCOPY  2015  . CYSTOSCOPY      age 53- stretched stem of bladder  . 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 SHOULDER ARTHROSCOPY Left 11/20/2014   Procedure: ARTHROSCOPY SHOULDER WITH MUA, ROTATOR INTERVAL RELEASE;  Surgeon: Meredith Pel, MD;  Location: [facility];  Service: Orthopedics;  Laterality: Left;  LEFT SHOULDER MUA, DOA, ROTATOR INTERVAL RELEASE.  . TONSILLECTOMY  2010   Social History   Socioeconomic History  . Marital status: Married    Spouse name: Not on file  . Number of children: 1  . Years of education: Not on file  . Highest education level: Not on file  Occupational History  .   Tobacco Use  . Smoking status: Never Smoker  . Smokeless tobacco: Never Used  Substance and Sexual Activity  . Alcohol use: Yes    Alcohol/week: 0.0 oz    Comment: rare  . Drug use: No      Social History Narrative   Married.   1 daughter.   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    Current Outpatient Medications on File Prior to Visit  Medication Sig Dispense Refill  . albuterol (PROVENTIL HFA;VENTOLIN HFA) 108 (90 Base) MCG/ACT inhaler Inhale 2 puffs into the lungs every 6 (six) hours as needed for wheezing or shortness of breath. 1 Inhaler 2  . blood glucose meter kit and supplies KIT Dispense based on patient and insurance preference. Use 1x a day. (FOR ICD-9 250.00, 250.01). 1 each 0  . doxycycline (VIBRA-TABS) 100 MG tablet Take 1 tablet (100 mg total) by mouth 2 (two) times daily. 20 tablet 0  . Dulaglutide (TRULICITY) 1.5 HR/4.1UL SOPN Inject 1.5 mg into the skin once a week. 12 pen 3  . glucose blood (FREESTYLE LITE) test strip Use as instructed, test blood sugar once a day 100 each 12  . Insulin Glargine (TOUJEO MAX SOLOSTAR) 300 UNIT/ML SOPN Inject 34 Units into the skin daily after supper. 9 pen 3  . Insulin Pen Needle 32G X 4 MM MISC Use 1x a day 100 each 3  . Lancets MISC Use 1x a day 100 each 3  . medroxyPROGESTERone (DEPO-PROVERA) 150 MG/ML injection Inject 150 mg into  the muscle every 3 (three) months.    . metFORMIN (GLUCOPHAGE) 1000 MG tablet Take 1 tablet (1,000 mg total) by mouth 2 (two) times daily with a meal. 180 tablet 3  . nebivolol (BYSTOLIC) 10 MG tablet Take 1 tablet (10 mg total) by mouth daily. 90 tablet 3  . telmisartan (MICARDIS) 40 MG tablet Take 1 tablet (40 mg total) by mouth daily. For blood pressure. 90 tablet 3  .  zolpidem (AMBIEN) 10 MG tablet Take 1 tablet (10 mg total) by mouth at bedtime as needed for sleep. 90 tablet 0   No current facility-administered medications on file prior to visit.    Allergies  Allergen Reactions  . Amoxicillin Anaphylaxis and Other (See Comments)    Has patient had a PCN reaction causing immediate rash, facial/tongue/throat swelling, SOB or lightheadedness with hypotension: Yes Has patient had a PCN reaction causing severe rash involving mucus membranes or skin necrosis: No Has patient had a PCN reaction that required hospitalization No Has patient had a PCN reaction occurring within the last 10 years: No If all of the above answers are "NO", then may proceed with Cephalosporin use.  . Ciprofloxacin Anaphylaxis  . Erythromycin Anaphylaxis  . Other Anaphylaxis    Pt states that she can only take Avelox, Levaquin, and Zithromax.    Marland Kitchen Penicillins Anaphylaxis and Other (See Comments)    Has patient had a PCN reaction causing immediate rash, facial/tongue/throat swelling, SOB or lightheadedness with hypotension: Yes Has patient had a PCN reaction causing severe rash involving mucus membranes or skin necrosis: No Has patient had a PCN reaction that required hospitalization No Has patient had a PCN reaction occurring within the last 10 years: No If all of the above answers are "NO", then may proceed with Cephalosporin use.  . Sulfa Antibiotics Anaphylaxis  . Vicodin [Hydrocodone-Acetaminophen] Itching   Family History  Adopted: Yes  Problem Relation Age of Onset  . Other Unknown        adopted     PE: BP 120/82   Pulse 70   Ht 5' 2"  (1.575 m)   Wt 211 lb (95.7 kg)   SpO2 98%   BMI 38.59 kg/m  Wt Readings from Last 3 Encounters:  12/12/18 211 lb (95.7 kg)  08/01/18 231 lb (104.8 kg)  07/07/18 238 lb (108 kg)   Constitutional: overweight, in NAD Eyes: PERRLA, EOMI, no exophthalmos ENT: moist mucous membranes, no thyromegaly, no cervical lymphadenopathy Cardiovascular: RRR, No MRG Respiratory: CTA B Gastrointestinal: abdomen soft, NT, ND, BS+ Musculoskeletal: no deformities, strength intact in all 4 Skin: moist, warm, no rashes Neurological: no tremor with outstretched hands, DTR normal in all 4  ASSESSMENT: 1. DM2, non-insulin-dependent, uncontrolled, without long-term complications, but with hyperglycemia  2.  Hyperlipidemia  3.  Obesity  PLAN:  1. Patient with longstanding, uncontrolled, type 2 diabetes, on oral antidiabetic regimen and GLP-1 receptor agonist.  At last visit, after she improved her diet and eliminated starches and concentrated sweets along with fast foods, her HbA1c improved dramatically, from 10.1% to 6.7%.  At that time, we started to decrease her long-acting insulin.  I advised her to stop it completely if the sugars remain controlled.  We did check her for type 1 diabetes at last visit and the investigation was negative. -At this visit, she tells me that she did not stop the insulin since she relaxed her diet at the start of the coronavirus pandemic due to stress.  She went increased insulin after 2 months, she started to improve her diet and to decrease her insulin dose, currently on 8 to 10 units of Toujeo daily.  Her sugars are now at goal and we discussed about trying to decrease the dose again and hopefully 1, sugars remain controlled.  Otherwise, we will continue metformin and Trulicity. - I suggested to:  Patient Instructions  Please continue: - Metformin 1000 mg 2x a day with meals - Trulicity 1.5 mg weekly -  Toujeo 7 units at  bedtime  Please come back for a follow-up appointment in 4 months.  - today, HbA1c is 6.0% (better) - continue checking sugars at different times of the day - check 1x a day, rotating checks - advised for yearly eye exams >> she is not UTD - Return to clinic in 4 mo with sugar log   2.  Hyperlipidemia - Reviewed latest lipid panel from 04/2018: LDL above goal, but improved, triglycerides high Lab Results  Component Value Date   CHOL 172 04/29/2018   HDL 43.40 04/29/2018   LDLCALC 122 (H) 05/12/2017   LDLDIRECT 109.0 04/29/2018   TRIG 240.0 (H) 04/29/2018   CHOLHDL 4 04/29/2018  -She is not on a statin -She lost 20 pounds since last visit.  We will recheck her cholesterol panel next visit and she may need to start a statin if not improved.  3.  Obesity -Continue Trulicity which is also helping with weight loss -Before last visit she lost 13 pounds by changing her diet significantly - now lost 20 lbs since last OV despite relaxing her diet - using [employer] shakes  Philemon Kingdom, MD PhD [facility] Endocrinology

## 2018-12-12 ENCOUNTER — Ambulatory Visit (INDEPENDENT_AMBULATORY_CARE_PROVIDER_SITE_OTHER): Admitting: Internal Medicine

## 2018-12-12 ENCOUNTER — Other Ambulatory Visit: Payer: Self-pay

## 2018-12-12 ENCOUNTER — Encounter: Payer: Self-pay | Admitting: Internal Medicine

## 2018-12-12 VITALS — BP 120/82 | HR 70 | Ht 62.0 in | Wt 211.0 lb

## 2018-12-12 DIAGNOSIS — Z6841 Body Mass Index (BMI) 40.0 and over, adult: Secondary | ICD-10-CM | POA: Diagnosis not present

## 2018-12-12 DIAGNOSIS — E1165 Type 2 diabetes mellitus with hyperglycemia: Secondary | ICD-10-CM

## 2018-12-12 DIAGNOSIS — E785 Hyperlipidemia, unspecified: Secondary | ICD-10-CM

## 2018-12-12 LAB — POCT GLYCOSYLATED HEMOGLOBIN (HGB A1C): Hemoglobin A1C: 6 % — AB (ref 4.0–5.6)

## 2018-12-12 NOTE — Patient Instructions (Signed)
Please continue: - Metformin 1000 mg 2x a day with meals - Trulicity 1.5 mg weekly  Please decrease: - Toujeo to 5-7 units at bedtime for the next week If the sugars remain controlled, stop the insulin.  Please come back for a follow-up appointment in 4 months.

## 2019-02-06 ENCOUNTER — Other Ambulatory Visit: Payer: Self-pay

## 2019-02-06 DIAGNOSIS — R110 Nausea: Secondary | ICD-10-CM

## 2019-02-06 DIAGNOSIS — G4700 Insomnia, unspecified: Secondary | ICD-10-CM

## 2019-02-07 MED ORDER — ONDANSETRON 8 MG PO TBDP: 8.0000 mg | ORAL_TABLET | Freq: Two times a day (BID) | ORAL | 0 refills | Status: DC | PRN

## 2019-02-07 MED ORDER — ZOLPIDEM TARTRATE 10 MG PO TABS: 10.0000 mg | ORAL_TABLET | Freq: Every evening | ORAL | 0 refills | Status: DC | PRN

## 2019-02-07 NOTE — Telephone Encounter (Signed)
Last prescribed on 11/02/2018. Last appointment on 07/07/2018 with [facility] for acute. No future appointment

## 2019-02-07 NOTE — Telephone Encounter (Signed)
There is refill request for Ambien sent to you

## 2019-02-25 ENCOUNTER — Other Ambulatory Visit: Payer: Self-pay | Admitting: Internal Medicine

## 2019-02-27 NOTE — Telephone Encounter (Signed)
Last fill 01/31/18  #180/3 Last OV 12/12/18

## 2019-03-13 ENCOUNTER — Encounter: Payer: Self-pay | Admitting: Primary Care

## 2019-03-13 ENCOUNTER — Other Ambulatory Visit: Payer: Self-pay

## 2019-03-13 ENCOUNTER — Ambulatory Visit (INDEPENDENT_AMBULATORY_CARE_PROVIDER_SITE_OTHER): Admitting: Primary Care

## 2019-03-13 VITALS — BP 120/78 | HR 85 | Temp 97.9°F | Ht 62.0 in | Wt 203.8 lb

## 2019-03-13 DIAGNOSIS — K148 Other diseases of tongue: Secondary | ICD-10-CM | POA: Diagnosis not present

## 2019-03-13 DIAGNOSIS — R220 Localized swelling, mass and lump, head: Secondary | ICD-10-CM | POA: Diagnosis not present

## 2019-03-13 DIAGNOSIS — I10 Essential (primary) hypertension: Secondary | ICD-10-CM | POA: Diagnosis not present

## 2019-03-13 MED ORDER — METHYLPREDNISOLONE ACETATE 80 MG/ML IJ SUSP
80.0000 mg | Freq: Once | INTRAMUSCULAR | Status: AC
  Administered 2019-03-13: 80 mg via INTRAMUSCULAR

## 2019-03-13 MED ORDER — TRIAMCINOLONE ACETONIDE 0.1 % MT PSTE: 1.0000 "application " | PASTE | Freq: Two times a day (BID) | OROMUCOSAL | 0 refills | Status: DC

## 2019-03-13 MED ORDER — TELMISARTAN 40 MG PO TABS: 40.0000 mg | ORAL_TABLET | Freq: Every day | ORAL | 3 refills | Status: DC

## 2019-03-13 MED ORDER — DOXYCYCLINE HYCLATE 100 MG PO TABS: 100.0000 mg | ORAL_TABLET | Freq: Two times a day (BID) | ORAL | 0 refills | Status: AC

## 2019-03-13 MED ORDER — METHYLPREDNISOLONE ACETATE 40 MG/ML IJ SUSP: 40.0000 mg | Freq: Once | INTRAMUSCULAR | Status: DC

## 2019-03-13 NOTE — Progress Notes (Signed)
Subjective:    Patient ID: Melissa [name], female    DOB: Oct 20, 1965, 53 y.o.   MRN: [id number]  HPI  Melissa [name] is a 53 year old female with a history of diabetes, GERD, hyperlipidemia, who presents today with a chief complaint of oral pain.  She endorses a sore to the anterior tongue for which she noticed about one week ago. Several days later she noticed a sore to the right side of her tongue with right cheek and facial swelling. Over the last several days she's noticed a progression in pain, swelling, and tenderness to the right side of her face. The anterior tongue sore has remained without improvement.   She has a history of this occurring years ago, started with a tongue sore that caused the same symptoms which resulted in facial cellulitis. She underwent treatment with IM steroids and oral antibiotics.   She's tried doing salt water/peroxide/nbaking soda rinses, taken Ibuprofen, applying CBD oil without improvement. She denies fevers. She's not seen any erythema to her skin, dental abscesses.   Review of Systems  Constitutional: Negative for fever.  HENT: Positive for ear pain. Negative for congestion and sore throat.        Facial swelling to right side. Tongue sore.  Respiratory: Negative for cough.   Skin: Negative for color change.       Past Medical History:  Diagnosis Date  . Asthma   . Complication of anesthesia   . Diabetes mellitus without complication ([place])    Type II  . Dysrhythmia    Tachycardia  . GERD (gastroesophageal reflux disease)   . Pneumonia    2014- 'Walking"  . Shortness of breath dyspnea    with exertion     Social History   Socioeconomic History  . Marital status: Married    Spouse name: Not on file  . Number of children: Not on file  . Years of education: Not on file  . Highest education level: Not on file  Occupational History  . Not on file  Social Needs  . Financial resource strain: Not on file  . Food insecurity    Worry: Not on  file    Inability: Not on file  . Transportation needs    Medical: Not on file    Non-medical: Not on file  Tobacco Use  . Smoking status: Never Smoker  . Smokeless tobacco: Never Used  Substance and Sexual Activity  . Alcohol use: Yes    Alcohol/week: 0.0 standard drinks    Comment: rare  . Drug use: No  . Sexual activity: Not on file  Lifestyle  . Physical activity    Days per week: Not on file    Minutes per session: Not on file  . Stress: Not on file  Relationships  . Social Herbalist on phone: Not on file    Gets together: Not on file    Attends religious service: Not on file    Active member of club or organization: Not on file    Attends meetings of clubs or organizations: Not on file    Relationship status: Not on file  . Intimate partner violence    Fear of current or ex partner: Not on file    Emotionally abused: Not on file    Physically abused: Not on file    Forced sexual activity: Not on file  Other Topics Concern  . Not on file  Social History Narrative   Married.  1 daughter.   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     Past Surgical History:  Procedure Laterality Date  . CHOLECYSTECTOMY  1997  . COLONOSCOPY  2015  . CYSTOSCOPY     age 64- stretched stem of bladder  . 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 SHOULDER ARTHROSCOPY Left 11/20/2014   Procedure: ARTHROSCOPY SHOULDER WITH MUA, ROTATOR INTERVAL RELEASE;  Surgeon: Meredith Pel, MD;  Location: [facility];  Service: Orthopedics;  Laterality: Left;  LEFT SHOULDER MUA, DOA, ROTATOR INTERVAL RELEASE.  . TONSILLECTOMY  2010    Family History  Adopted: Yes  Problem Relation Age of Onset  . Other Unknown        adopted    Allergies  Allergen Reactions  . Amoxicillin Anaphylaxis and Other (See Comments)    Has patient had a PCN reaction causing immediate rash, facial/tongue/throat swelling, SOB or lightheadedness with hypotension: Yes Has patient had a PCN  reaction causing severe rash involving mucus membranes or skin necrosis: No Has patient had a PCN reaction that required hospitalization No Has patient had a PCN reaction occurring within the last 10 years: No If all of the above answers are "NO", then may proceed with Cephalosporin use.  . Ciprofloxacin Anaphylaxis  . Erythromycin Anaphylaxis  . Other Anaphylaxis    Pt states that she can only take Avelox, Levaquin, and Zithromax.    Marland Kitchen Penicillins Anaphylaxis and Other (See Comments)    Has patient had a PCN reaction causing immediate rash, facial/tongue/throat swelling, SOB or lightheadedness with hypotension: Yes Has patient had a PCN reaction causing severe rash involving mucus membranes or skin necrosis: No Has patient had a PCN reaction that required hospitalization No Has patient had a PCN reaction occurring within the last 10 years: No If all of the above answers are "NO", then may proceed with Cephalosporin use.  . Sulfa Antibiotics Anaphylaxis  . Vicodin [Hydrocodone-Acetaminophen] Itching    Current Outpatient Medications on File Prior to Visit  Medication Sig Dispense Refill  . albuterol (PROVENTIL HFA;VENTOLIN HFA) 108 (90 Base) MCG/ACT inhaler Inhale 2 puffs into the lungs every 6 (six) hours as needed for wheezing or shortness of breath. 1 Inhaler 2  . blood glucose meter kit and supplies KIT Dispense based on patient and insurance preference. Use 1x a day. (FOR ICD-9 250.00, 250.01). 1 each 0  . Dulaglutide (TRULICITY) 1.5 QG/9.2EF SOPN Inject 1.5 mg into the skin once a week. 12 pen 3  . glucose blood (FREESTYLE LITE) test strip Use as instructed, test blood sugar once a day 100 each 12  . Insulin Glargine (TOUJEO MAX SOLOSTAR) 300 UNIT/ML SOPN Inject 34 Units into the skin daily after supper. 9 pen 3  . Insulin Pen Needle 32G X 4 MM MISC Use 1x a day 100 each 3  . Lancets MISC Use 1x a day 100 each 3  . medroxyPROGESTERone (DEPO-PROVERA) 150 MG/ML injection Inject 150 mg  into the muscle every 3 (three) months.    . metFORMIN (GLUCOPHAGE) 1000 MG tablet TAKE 1 TABLET TWICE A DAY WITH MEALS 180 tablet 3  . nebivolol (BYSTOLIC) 10 MG tablet Take 1 tablet (10 mg total) by mouth daily. 90 tablet 3  . ondansetron (ZOFRAN ODT) 8 MG disintegrating tablet Take 1 tablet (8 mg total) by mouth 2 (two) times daily as needed for nausea or vomiting. 20 tablet 0  . zolpidem (AMBIEN) 10 MG tablet Take 1  tablet (10 mg total) by mouth at bedtime as needed for sleep. 90 tablet 0   No current facility-administered medications on file prior to visit.     BP 120/78   Pulse 85   Temp 97.9 F (36.6 C) (Temporal)   Ht 5' 2"  (1.575 m)   Wt 203 lb 12 oz (92.4 kg)   SpO2 98%   BMI 37.27 kg/m    Objective:   Physical Exam  Constitutional: She is oriented to person, place, and time. She appears well-nourished.  HENT:  Right Ear: Tympanic membrane and ear canal normal.  Left Ear: Tympanic membrane and ear canal normal.  Mouth/Throat:    Mild swelling to right cheek and submandibular region, moderate tenderness.  0.5 cm oval shaped lesion to right anterior tongue, ulcerated appearance   Cardiovascular: Normal rate and regular rhythm.  Respiratory: Effort normal and breath sounds normal.  Neurological: She is alert and oriented to person, place, and time.  Skin: Skin is warm and dry. No erythema.           Assessment & Plan:

## 2019-03-13 NOTE — Assessment & Plan Note (Signed)
Appears ulcerated. Rx for triamcinolone paste sent to pharmacy. She will update.

## 2019-03-13 NOTE — Addendum Note (Signed)
Addended by: Jacqualin Combes on: 03/13/2019 10:38 AM   Modules accepted: Orders

## 2019-03-13 NOTE — Assessment & Plan Note (Signed)
Very odd case, obvious facial swelling with tongue sore that appears ulcerated.  Given presentation, history of the same symptoms previously which resulted in cellulitis, will treat with IM steroids and oral antibiotics. Rx for Doxycycline sent to pharmacy.   She will update. She does appear stable for outpatient treatment. Return precautions provided.

## 2019-03-13 NOTE — Patient Instructions (Signed)
Start Doxycycline antibiotic for the infection. Take 1 tablet by mouth twice daily for 7 days.  You can apply the triamcinolone paste twice daily for the tongue lesion.  Please update me by the end of the week.  It was a pleasure to see you today!

## 2019-03-20 ENCOUNTER — Other Ambulatory Visit: Payer: Self-pay

## 2019-03-20 ENCOUNTER — Encounter: Payer: Self-pay | Admitting: Internal Medicine

## 2019-03-20 ENCOUNTER — Ambulatory Visit (INDEPENDENT_AMBULATORY_CARE_PROVIDER_SITE_OTHER): Admitting: Internal Medicine

## 2019-03-20 VITALS — BP 122/80 | HR 87 | Temp 98.1°F | Wt 200.0 lb

## 2019-03-20 DIAGNOSIS — G510 Bell's palsy: Secondary | ICD-10-CM

## 2019-03-20 DIAGNOSIS — K121 Other forms of stomatitis: Secondary | ICD-10-CM

## 2019-03-20 MED ORDER — PREDNISONE 20 MG PO TABS: 40.0000 mg | ORAL_TABLET | Freq: Every day | ORAL | 0 refills | Status: DC

## 2019-03-20 NOTE — Patient Instructions (Signed)
Bell Palsy, Adult  Bell palsy is a short-term inability to move muscles in part of the face. The inability to move (paralysis) results from inflammation or compression of the facial nerve, which travels along the skull and under the ear to the side of the face (7th cranial nerve). This nerve is responsible for facial movements that include blinking, closing the eyes, smiling, and frowning. What are the causes? The exact cause of this condition is not known. It may be caused by an infection from a virus, such as the chickenpox (herpes zoster), Epstein-Barr, or mumps virus. What increases the risk? You are more likely to develop this condition if:  You are pregnant.  You have diabetes.  You have had a recent infection in your nose, throat, or airways (upper respiratory infection).  You have a weakened body defense system (immune system).  You have had a facial injury, such as a fracture.  You have a family history of Bell palsy. What are the signs or symptoms? Symptoms of this condition include:  Weakness on one side of the face.  Drooping eyelid and corner of the mouth.  Excessive tearing in one eye.  Difficulty closing the eyelid.  Dry eye.  Drooling.  Dry mouth.  Changes in taste.  Change in facial appearance.  Pain behind one ear.  Ringing in one or both ears.  Sensitivity to sound in one ear.  Facial twitching.  Headache.  Impaired speech.  Dizziness.  Difficulty eating or drinking. Most of the time, only one side of the face is affected. Rarely, Bell palsy affects the whole face. How is this diagnosed? This condition is diagnosed based on:  Your symptoms.  Your medical history.  A physical exam. You may also have to see health care providers who specialize in disorders of the nerves (neurologist) or diseases and conditions of the eye (ophthalmologist). You may have tests, such as:  A test to check for nerve damage (electromyogram).  Imaging  studies, such as CT or MRI scans.  Blood tests. How is this treated? This condition affects every person differently. Sometimes symptoms go away without treatment within a couple weeks. If treatment is needed, it varies from person to person. The goal of treatment is to reduce inflammation and protect the eye from damage. Treatment for Bell palsy may include:  Medicines, such as: ? Steroids to reduce swelling and inflammation. ? Antiviral drugs. ? Pain relievers, including aspirin, acetaminophen, or ibuprofen.  Eye drops or ointment to keep your eye moist.  Eye protection, if you cannot close your eye.  Exercises or massage to regain muscle strength and function (physical therapy). Follow these instructions at home:   Take over-the-counter and prescription medicines only as told by your health care provider.  If your eye is affected: ? Keep your eye moist with eye drops or ointment as told by your health care provider. ? Follow instructions for eye care and protection as told by your health care provider.  Do any physical therapy exercises as told by your health care provider.  Keep all follow-up visits as told by your health care provider. This is important. Contact a health care provider if:  You have a fever.  Your symptoms do not get better within 2-3 weeks, or your symptoms get worse.  Your eye is red, irritated, or painful.  You have new symptoms. Get help right away if:  You have weakness or numbness in a part of your body other than your face.  You have   trouble swallowing.  You develop neck pain or stiffness.  You develop dizziness or shortness of breath. Summary  Bell palsy is a short-term inability to move muscles in part of the face. The inability to move (paralysis) results from inflammation or compression of the facial nerve.  This condition affects every person differently. Sometimes symptoms go away without treatment within a couple weeks.  If  treatment is needed, it varies from person to person. The goal of treatment is to reduce inflammation and protect the eye from damage.  Contact your health care provider if your symptoms do not get better within 2-3 weeks, or your symptoms get worse. This information is not intended to replace advice given to you by your health care provider. Make sure you discuss any questions you have with your health care provider. Document Released: 06/01/2005 Document Revised: 05/14/2017 Document Reviewed: 08/04/2016 Elsevier Patient Education  2020 Elsevier Inc.  

## 2019-03-20 NOTE — Progress Notes (Signed)
Subjective:    Patient ID: Melissa [name], female    DOB: [birth year], 53 y.o.   MRN: [id number]  HPI  Pt presents to the clinic today with c/o right ear pain. This started about 10 days ago. She describes the pain as. She reports associated right eye twitching and facial drooping that started 2 days ago. She was recently seen for facial swelling and tongue lesion on 9/28. Given her history of facial cellulitis in the past, she was treated with Doxycycline and Triamcinolone paste. She reports the lesion in her mouth are almost completely healed. She is concerned she may have Bell's Palsy. She denies unilateral weakness in her arms and legs. She has not taken anything OTC for this.   Review of Systems  Past Medical History:  Diagnosis Date  . Asthma   . Complication of anesthesia   . Diabetes mellitus without complication ([place])    Type II  . Dysrhythmia    Tachycardia  . GERD (gastroesophageal reflux disease)   . Pneumonia    2014- 'Walking"  . Shortness of breath dyspnea    with exertion    Current Outpatient Medications  Medication Sig Dispense Refill  . albuterol (PROVENTIL HFA;VENTOLIN HFA) 108 (90 Base) MCG/ACT inhaler Inhale 2 puffs into the lungs every 6 (six) hours as needed for wheezing or shortness of breath. 1 Inhaler 2  . blood glucose meter kit and supplies KIT Dispense based on patient and insurance preference. Use 1x a day. (FOR ICD-9 250.00, 250.01). 1 each 0  . doxycycline (VIBRA-TABS) 100 MG tablet Take 1 tablet (100 mg total) by mouth 2 (two) times daily for 7 days. 14 tablet 0  . Dulaglutide (TRULICITY) 1.5 EL/8.5TM SOPN Inject 1.5 mg into the skin once a week. 12 pen 3  . glucose blood (FREESTYLE LITE) test strip Use as instructed, test blood sugar once a day 100 each 12  . Insulin Glargine (TOUJEO MAX SOLOSTAR) 300 UNIT/ML SOPN Inject 34 Units into the skin daily after supper. 9 pen 3  . Insulin Pen Needle 32G X 4 MM MISC Use 1x a day 100 each 3  . Lancets MISC  Use 1x a day 100 each 3  . medroxyPROGESTERone (DEPO-PROVERA) 150 MG/ML injection Inject 150 mg into the muscle every 3 (three) months.    . metFORMIN (GLUCOPHAGE) 1000 MG tablet TAKE 1 TABLET TWICE A DAY WITH MEALS 180 tablet 3  . nebivolol (BYSTOLIC) 10 MG tablet Take 1 tablet (10 mg total) by mouth daily. 90 tablet 3  . ondansetron (ZOFRAN ODT) 8 MG disintegrating tablet Take 1 tablet (8 mg total) by mouth 2 (two) times daily as needed for nausea or vomiting. 20 tablet 0  . telmisartan (MICARDIS) 40 MG tablet Take 1 tablet (40 mg total) by mouth daily. For blood pressure. 90 tablet 3  . triamcinolone (KENALOG) 0.1 % paste Use as directed 1 application in the mouth or throat 2 (two) times daily. 5 g 0  . zolpidem (AMBIEN) 10 MG tablet Take 1 tablet (10 mg total) by mouth at bedtime as needed for sleep. 90 tablet 0   No current facility-administered medications for this visit.     Allergies  Allergen Reactions  . Amoxicillin Anaphylaxis and Other (See Comments)    Has patient had a PCN reaction causing immediate rash, facial/tongue/throat swelling, SOB or lightheadedness with hypotension: Yes Has patient had a PCN reaction causing severe rash involving mucus membranes or skin necrosis: No Has patient had a PCN  reaction that required hospitalization No Has patient had a PCN reaction occurring within the last 10 years: No If all of the above answers are "NO", then may proceed with Cephalosporin use.  . Ciprofloxacin Anaphylaxis  . Erythromycin Anaphylaxis  . Other Anaphylaxis    Pt states that she can only take Avelox, Levaquin, and Zithromax.    Marland Kitchen Penicillins Anaphylaxis and Other (See Comments)    Has patient had a PCN reaction causing immediate rash, facial/tongue/throat swelling, SOB or lightheadedness with hypotension: Yes Has patient had a PCN reaction causing severe rash involving mucus membranes or skin necrosis: No Has patient had a PCN reaction that required hospitalization No  Has patient had a PCN reaction occurring within the last 10 years: No If all of the above answers are "NO", then may proceed with Cephalosporin use.  . Sulfa Antibiotics Anaphylaxis  . Vicodin [Hydrocodone-Acetaminophen] Itching    Family History  Adopted: Yes  Problem Relation Age of Onset  . Other Unknown        adopted    Social History   Socioeconomic History  . Marital status: Married    Spouse name: Not on file  . Number of children: Not on file  . Years of education: Not on file  . Highest education level: Not on file  Occupational History  . Not on file  Social Needs  . Financial resource strain: Not on file  . Food insecurity    Worry: Not on file    Inability: Not on file  . Transportation needs    Medical: Not on file    Non-medical: Not on file  Tobacco Use  . Smoking status: Never Smoker  . Smokeless tobacco: Never Used  Substance and Sexual Activity  . Alcohol use: Yes    Alcohol/week: 0.0 standard drinks    Comment: rare  . Drug use: No  . Sexual activity: Not on file  Lifestyle  . Physical activity    Days per week: Not on file    Minutes per session: Not on file  . Stress: Not on file  Relationships  . Social Herbalist on phone: Not on file    Gets together: Not on file    Attends religious service: Not on file    Active member of club or organization: Not on file    Attends meetings of clubs or organizations: Not on file    Relationship status: Not on file  . Intimate partner violence    Fear of current or ex partner: Not on file    Emotionally abused: Not on file    Physically abused: Not on file    Forced sexual activity: Not on file  Other Topics Concern  . Not on file  Social History Narrative   Married.   1 daughter.   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      Constitutional: Denies fever, malaise, fatigue, headache or abrupt weight changes.  HEENT: Pt reports right ear pain, healing  oral ulcerations. Denies eye pain, eye redness, ringing in the ears, wax buildup, runny nose, nasal congestion, bloody nose, or sore throat. Respiratory: Denies difficulty breathing, shortness of breath, cough or sputum production.   Cardiovascular: Denies chest pain, chest tightness, palpitations or swelling in the hands or feet.  Neurological: Pt reports right side facial droop. Denies dizziness, difficulty with memory, difficulty with speech or problems with balance and coordination.    No other specific  complaints in a complete review of systems (except as listed in HPI above).     Objective:   Physical Exam  BP 122/80   Pulse 87   Temp 98.1 F (36.7 C) (Temporal)   Wt 200 lb (90.7 kg)   SpO2 98%   BMI 36.58 kg/m  Wt Readings from Last 3 Encounters:  03/20/19 200 lb (90.7 kg)  03/13/19 203 lb 12 oz (92.4 kg)  12/12/18 211 lb (95.7 kg)    General: Appears her stated age, obese, in NAD. Skin: Warm, dry and intact. No rashes noted of right side of face. HEENT: Head: normal shape and size; Ears: Tm's gray and intact, normal light reflex, cerumen buildup noted; Throat/Mouth: Teeth present, mucosa pink and moist, no exudate, lesions or ulcerations noted.  Neck:  No adenopathy noted.  Cardiovascular: Normal rate and rhythm.  Pulmonary/Chest: Normal effort and positive vesicular breath sounds. No respiratory distress. No wheezes, rales or ronchi noted.  Neurological: Alert and oriented. Right side facial droop only noted when smiling. All other cranial nerves II-XII grossly intact. Coordination normal.    BMET    Component Value Date/Time   NA 138 04/29/2018 0740   K 4.0 04/29/2018 0740   CL 102 04/29/2018 0740   CO2 27 04/29/2018 0740   GLUCOSE 283 (H) 04/29/2018 0740   BUN 13 04/29/2018 0740   CREATININE 0.89 04/29/2018 0740   CALCIUM 8.8 04/29/2018 0740   GFRNONAA >60 10/31/2017 1732   GFRAA >60 10/31/2017 1732    Lipid Panel     Component Value Date/Time   CHOL  172 04/29/2018 0740   TRIG 240.0 (H) 04/29/2018 0740   HDL 43.40 04/29/2018 0740   CHOLHDL 4 04/29/2018 0740   VLDL 48.0 (H) 04/29/2018 0740   LDLCALC 122 (H) 05/12/2017 0931    CBC    Component Value Date/Time   WBC 8.2 10/31/2017 1732   RBC 4.45 10/31/2017 1732   HGB 13.5 10/31/2017 1732   HCT 39.2 10/31/2017 1732   PLT 225 10/31/2017 1732   MCV 88.1 10/31/2017 1732   MCH 30.3 10/31/2017 1732   MCHC 34.4 10/31/2017 1732   RDW 14.0 10/31/2017 1732    Hgb A1C Lab Results  Component Value Date   HGBA1C 6.0 (A) 12/12/2018            Assessment & Plan:   Bells Palsy:  Rx for Prednisone 40 mg daily x 7 days (monitor sugars, notify me if they are over 500) Continue course of Doxy until finished  Oral Ulcerations:  Likely viral Continue Triamcinolone paste  Return precautions discussed Webb Silversmith, NP

## 2019-03-22 ENCOUNTER — Emergency Department
Admission: EM | Admit: 2019-03-22 | Discharge: 2019-03-22 | Disposition: A | Discharge status: Home / Self Care | Attending: Student in an Organized Health Care Education/Training Program | Admitting: Student in an Organized Health Care Education/Training Program

## 2019-03-22 ENCOUNTER — Other Ambulatory Visit: Payer: Self-pay

## 2019-03-22 ENCOUNTER — Emergency Department

## 2019-03-22 ENCOUNTER — Telehealth: Payer: Self-pay

## 2019-03-22 ENCOUNTER — Encounter: Payer: Self-pay | Admitting: Emergency Medicine

## 2019-03-22 DIAGNOSIS — Z79899 Other long term (current) drug therapy: Secondary | ICD-10-CM | POA: Insufficient documentation

## 2019-03-22 DIAGNOSIS — J45909 Unspecified asthma, uncomplicated: Secondary | ICD-10-CM | POA: Diagnosis not present

## 2019-03-22 DIAGNOSIS — R29810 Facial weakness: Secondary | ICD-10-CM | POA: Diagnosis not present

## 2019-03-22 DIAGNOSIS — Z794 Long term (current) use of insulin: Secondary | ICD-10-CM | POA: Insufficient documentation

## 2019-03-22 DIAGNOSIS — E119 Type 2 diabetes mellitus without complications: Secondary | ICD-10-CM | POA: Diagnosis not present

## 2019-03-22 DIAGNOSIS — R519 Headache, unspecified: Secondary | ICD-10-CM

## 2019-03-22 DIAGNOSIS — G510 Bell's palsy: Secondary | ICD-10-CM | POA: Insufficient documentation

## 2019-03-22 DIAGNOSIS — G501 Atypical facial pain: Secondary | ICD-10-CM | POA: Diagnosis present

## 2019-03-22 LAB — GLUCOSE, CAPILLARY: Glucose-Capillary: 224 mg/dL — ABNORMAL HIGH (ref 70–99)

## 2019-03-22 MED ORDER — ONDANSETRON HCL 4 MG PO TABS: 4.0000 mg | ORAL_TABLET | Freq: Every day | ORAL | 0 refills | Status: DC | PRN

## 2019-03-22 MED ORDER — FLUORESCEIN SODIUM 1 MG OP STRP
ORAL_STRIP | OPHTHALMIC | Status: AC
  Filled 2019-03-22: qty 1

## 2019-03-22 MED ORDER — ONDANSETRON 4 MG PO TBDP
4.0000 mg | ORAL_TABLET | Freq: Once | ORAL | Status: AC
  Administered 2019-03-22: 12:00:00 4 mg via ORAL
  Filled 2019-03-22: qty 1

## 2019-03-22 MED ORDER — VALACYCLOVIR HCL 1 G PO TABS: 1000.0000 mg | ORAL_TABLET | Freq: Three times a day (TID) | ORAL | 0 refills | Status: DC

## 2019-03-22 MED ORDER — ACETAMINOPHEN 500 MG PO TABS
1000.0000 mg | ORAL_TABLET | Freq: Once | ORAL | Status: AC
  Administered 2019-03-22: 1000 mg via ORAL
  Filled 2019-03-22: qty 2

## 2019-03-22 MED ORDER — PROCHLORPERAZINE MALEATE 10 MG PO TABS
10.0000 mg | ORAL_TABLET | Freq: Once | ORAL | Status: AC
  Administered 2019-03-22: 10 mg via ORAL
  Filled 2019-03-22: qty 1

## 2019-03-22 MED ORDER — VALACYCLOVIR HCL 1 G PO TABS: 1000.0000 mg | ORAL_TABLET | Freq: Three times a day (TID) | ORAL | 0 refills | Status: AC

## 2019-03-22 NOTE — Telephone Encounter (Signed)
Below is from patient through mychart. Sending to [facility] for triage.  Melissa [name],  I know that Bell's Palsy can cause a lot of things, but woke up this morning with my eye pulling a little more and a bit of numbness on my lip.  I had a tough time with my left eye being able to open unless I physically held my right eye right.  Very strange.  I truly don't think strike, but it's getting a bit more pronounced.  Headache this morning, but I don't tolerate prednisone well anyway.  Very nauseous.

## 2019-03-22 NOTE — Telephone Encounter (Signed)
I spoke with pt and was reviewing her symptoms and pt said she appreciated the call but she is arriving now at [facility] ED and pt wants eval now at ED. FYI to Gentry Fitz NP.

## 2019-03-22 NOTE — Telephone Encounter (Signed)
Noted.  Patient currently at [facility] ED. CT head negative.

## 2019-03-22 NOTE — ED Provider Notes (Signed)
[facility] Emergency Department Provider Note    First MD Initiated Contact with Patient 03/22/19 1259     (approximate)  I have reviewed the triage vital signs and the nursing notes.   HISTORY  Chief Complaint Bells Palsy    HPI Melissa [name] is a 53 y.o. female close past medical history presents the ER with right facial pain droop having some drooling and decreased taste and sensation the right side of her tongue.  States that then the last week she did notice a lesion on the right side of her tongue and side of her mouth.  Was seen by PCP and was given triamcinolone as well as doxycycline.  States that the lesions improved but is still having pain states that she is having pulling of her eye.  No blurry vision.  No numbness or tingling.  Denies any fevers.  No trouble swallowing or speaking.    Past Medical History:  Diagnosis Date  . Asthma   . Complication of anesthesia   . Diabetes mellitus without complication ([place])    Type II  . Dysrhythmia    Tachycardia  . GERD (gastroesophageal reflux disease)   . Pneumonia    2014- 'Walking"  . Shortness of breath dyspnea    with exertion   Family History  Adopted: Yes  Problem Relation Age of Onset  . Other Other        adopted   Past Surgical History:  Procedure Laterality Date  . CHOLECYSTECTOMY  1997  . COLONOSCOPY  2015  . CYSTOSCOPY     age 39- stretched stem of bladder  . 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 SHOULDER ARTHROSCOPY Left 11/20/2014   Procedure: ARTHROSCOPY SHOULDER WITH MUA, ROTATOR INTERVAL RELEASE;  Surgeon: Meredith Pel, MD;  Location: [place];  Service: Orthopedics;  Laterality: Left;  LEFT SHOULDER MUA, DOA, ROTATOR INTERVAL RELEASE.  . TONSILLECTOMY  2010   Patient Active Problem List   Diagnosis Date Noted  . Facial swelling 03/13/2019  . Tongue lesion 03/13/2019  . Dyslipidemia 04/07/2018  . Class 3 severe obesity with serious comorbidity and body mass index  (BMI) of 40.0 to 44.9 in adult ([place]) 04/07/2018  . Abnormal findings on examination of skull and head 04/26/2017  . Vasovagal syncope 04/26/2017  . Lymphadenitis 11/02/2016  . Vertigo 11/02/2016  . Other fatigue 03/23/2016  . Insomnia 03/23/2016  . Nausea without vomiting 06/07/2015  . Type 2 diabetes mellitus with hyperglycemia, without long-term current use of insulin ([place]) 06/07/2015  . GERD (gastroesophageal reflux disease) 06/07/2015  . Tachycardia 06/07/2015      Prior to Admission medications   Medication Sig Start Date End Date Taking? Authorizing Provider  albuterol (PROVENTIL HFA;VENTOLIN HFA) 108 (90 Base) MCG/ACT inhaler Inhale 2 puffs into the lungs every 6 (six) hours as needed for wheezing or shortness of breath. 07/07/18   Baity, Coralie Keens, NP  blood glucose meter kit and supplies KIT Dispense based on patient and insurance preference. Use 1x a day. (FOR ICD-9 250.00, 250.01). 04/07/18   Philemon Kingdom, MD  Dulaglutide (TRULICITY) 1.5 WF/0.9NA SOPN Inject 1.5 mg into the skin once a week. 04/07/18   Philemon Kingdom, MD  glucose blood (FREESTYLE LITE) test strip Use as instructed, test blood sugar once a day 06/22/18   Philemon Kingdom, MD  Insulin Glargine (TOUJEO MAX SOLOSTAR) 300 UNIT/ML SOPN Inject 34 Units into the skin daily after supper. 04/07/18   Philemon Kingdom, MD  Insulin Pen Needle 32G X 4 MM MISC Use 1x a day 04/07/18   Philemon Kingdom, MD  Lancets MISC Use 1x a day 04/07/18   Philemon Kingdom, MD  medroxyPROGESTERone (DEPO-PROVERA) 150 MG/ML injection Inject 150 mg into the muscle every 3 (three) months.    [provider]  metFORMIN (GLUCOPHAGE) 1000 MG tablet TAKE 1 TABLET TWICE A DAY WITH MEALS 02/27/19   Philemon Kingdom, MD  nebivolol (BYSTOLIC) 10 MG tablet Take 1 tablet (10 mg total) by mouth daily. 06/17/18   Pleas Koch, NP  ondansetron (ZOFRAN ODT) 8 MG disintegrating tablet Take 1 tablet (8 mg total) by mouth 2 (two) times daily  as needed for nausea or vomiting. 02/07/19   Pleas Koch, NP  ondansetron (ZOFRAN) 4 MG tablet Take 1 tablet (4 mg total) by mouth daily as needed. 03/22/19 03/21/20  Merlyn Lot, MD  predniSONE (DELTASONE) 20 MG tablet Take 2 tablets (40 mg total) by mouth daily with breakfast. 03/20/19   Jearld Fenton, NP  telmisartan (MICARDIS) 40 MG tablet Take 1 tablet (40 mg total) by mouth daily. For blood pressure. 03/13/19   Pleas Koch, NP  triamcinolone (KENALOG) 0.1 % paste Use as directed 1 application in the mouth or throat 2 (two) times daily. 03/13/19   Pleas Koch, NP  valACYclovir (VALTREX) 1000 MG tablet Take 1 tablet (1,000 mg total) by mouth 3 (three) times daily for 7 days. 03/22/19 03/29/19  Merlyn Lot, MD  zolpidem (AMBIEN) 10 MG tablet Take 1 tablet (10 mg total) by mouth at bedtime as needed for sleep. 02/07/19 05/08/19  Pleas Koch, NP    Allergies Amoxicillin, Ciprofloxacin, Erythromycin, Other, Penicillins, Sulfa antibiotics, and Vicodin [hydrocodone-acetaminophen]    Social History Social History   Tobacco Use  . Smoking status: Never Smoker  . Smokeless tobacco: Never Used  Substance Use Topics  . Alcohol use: Yes    Alcohol/week: 0.0 standard drinks  . Drug use: No    Review of Systems Patient denies headaches, rhinorrhea, blurry vision, numbness, shortness of breath, chest pain, edema, cough, abdominal pain, nausea, vomiting, diarrhea, dysuria, fevers, rashes or hallucinations unless otherwise stated above in HPI. ____________________________________________   PHYSICAL EXAM:  VITAL SIGNS: Vitals:   03/22/19 1205  BP: 130/78  Pulse: 91  Temp: 99.1 F (37.3 C)  SpO2: 97%    Constitutional: Alert and oriented.  Eyes: Conjunctivae are normal.  Head: Atraumatic. Nose: No congestion/rhinnorhea. Mouth/Throat: Mucous membranes are moist.  Healing what appears to be a vesicular lesion on right middle of the tongue. Neck: No  stridor. Painless ROM.  Cardiovascular: Normal rate, regular rhythm. Grossly normal heart sounds.  Good peripheral circulation. Respiratory: Normal respiratory effort.  No retractions. Lungs CTAB. Gastrointestinal: Soft and nontender. No distention. No abdominal bruits. No CVA tenderness. Genitourinary:  Musculoskeletal: No lower extremity tenderness nor edema.  No joint effusions. Neurologic:  Normal speech and language.  Right facial droop with some weakness in closing the right eye and motion of the forehead.    CN otherwise intact.    Sensation intact bilaterally. No gross focal neurologic deficits are appreciated. No gait instability. Skin:  Skin is warm, dry and intact. No rash noted. Psychiatric: Mood and affect are normal. Speech and behavior are normal.  ____________________________________________   LABS (all labs ordered are listed, but only abnormal results are displayed)  Results for orders placed or performed during the hospital encounter of 03/22/19 (from the past 24 hour(s))  Glucose, capillary  Status: Abnormal   Collection Time: 03/22/19  1:13 PM  Result Value Ref Range   Glucose-Capillary 224 (H) 70 - 99 mg/dL   ____________________________________________  EKG__________________________________  RADIOLOGY  I personally reviewed all radiographic images ordered to evaluate for the above acute complaints and reviewed radiology reports and findings.  These findings were personally discussed with the patient.  Please see medical record for radiology report.  ____________________________________________   PROCEDURES  Procedure(s) performed:  Procedures    Critical Care performed: no ____________________________________________   INITIAL IMPRESSION / ASSESSMENT AND PLAN / ED COURSE  Pertinent labs & imaging results that were available during my care of the patient were reviewed by me and considered in my medical decision making (see chart for details).    DDX: Bell's, herpes zoster, ramsay hunt erythema multiforme, Stevens-Johnson's, CVA, Ludwick's, lymphadenitis, parotitis  Aidaly L Yazdani is a 53 y.o. who presents to the ED with symptoms as described above.  Patient well-appearing.  Exam as above.  Does not seem consistent with CVA.  CT head ordered out of triage is reassuring.  Her exam is consistent with a peripheral facial palsy consistent with Bell's likely secondary to recent shingles she does have a picture on her phone showing what appears to be a zoster appearing lesion on the right side of her tongue and had a few other lesions in the right side of her oropharynx.  I do not appreciate any evidence of parotitis.  No other mucosal lesions to suggest SJS or TE N.  Not consistent with Ludwig's.  She is having some nausea related to prednisone.  Blood sugar 200 would be expected on her prednisone.  Will add on valcyclovir.  Patient declining any pain medication.  I do not see any indication for further diagnostic imaging or laboratory testing with this current clinical presentation.  Discussed symptomatic management and follow-up with PCP.     The patient was evaluated in Emergency Department today for the symptoms described in the history of present illness. He/she was evaluated in the context of the global COVID-19 pandemic, which necessitated consideration that the patient might be at risk for infection with the SARS-CoV-2 virus that causes COVID-19. Institutional protocols and algorithms that pertain to the evaluation of patients at risk for COVID-19 are in a state of rapid change based on information released by regulatory bodies including the CDC and federal and state organizations. These policies and algorithms were followed during the patient's care in the ED.  As part of my medical decision making, I reviewed the following data within the [place] notes reviewed and incorporated, Labs reviewed, notes from prior ED visits  and [place] Controlled Substance Database   ____________________________________________   FINAL CLINICAL IMPRESSION(S) / ED DIAGNOSES  Final diagnoses:  Bell's palsy  Facial pain      NEW MEDICATIONS STARTED DURING THIS VISIT:  New Prescriptions   ONDANSETRON (ZOFRAN) 4 MG TABLET    Take 1 tablet (4 mg total) by mouth daily as needed.   VALACYCLOVIR (VALTREX) 1000 MG TABLET    Take 1 tablet (1,000 mg total) by mouth 3 (three) times daily for 7 days.     Note:  This document was prepared using Dragon voice recognition software and may include unintentional dictation errors.    Merlyn Lot, MD 03/22/19 1353

## 2019-03-22 NOTE — ED Notes (Signed)
Pt alert and oriented X 4, stable for discharge. RR even and unlabored, color WNL. Discussed discharge instructions and follow up when appropriate. Instructed to follow up with ER for any life threatening symptoms or concerns that patient or family of patient may have  

## 2019-03-22 NOTE — ED Triage Notes (Signed)
Pt in via POV, reports being diagnosed with Bell's Palsy on Monday per PCP.  Pt has since developed numbness to right bottom lip w/ headache, nausea; onset of of symptoms 1100 today.  Vitals WDL, NAD noted at this time.

## 2019-03-22 NOTE — ED Notes (Signed)
Pt presentation discussed with EDP, [place]; see new orders. °

## 2019-04-06 ENCOUNTER — Encounter: Payer: Self-pay | Admitting: Internal Medicine

## 2019-04-13 ENCOUNTER — Ambulatory Visit: Admitting: Internal Medicine

## 2019-05-16 ENCOUNTER — Telehealth: Payer: Self-pay | Admitting: Primary Care

## 2019-05-16 DIAGNOSIS — G4700 Insomnia, unspecified: Secondary | ICD-10-CM

## 2019-05-16 MED ORDER — ZOLPIDEM TARTRATE 10 MG PO TABS: 10.0000 mg | ORAL_TABLET | Freq: Every evening | ORAL | 0 refills | Status: DC | PRN

## 2019-05-16 NOTE — Telephone Encounter (Signed)
Noted, refill sent to pharmacy. 

## 2019-05-16 NOTE — Telephone Encounter (Signed)
Last prescribed on 02/07/2019 . Last appointment on 03/20/2019 for acute with [facility]. Next future appointment on 06/08/2019

## 2019-05-16 NOTE — Telephone Encounter (Signed)
Patient made an appointment for her physical in the next available slot on 06/08/19.  Patient has 2 Ambien pills left.  Patient's requesting a refill until her appointment.  Patient uses [facility].

## 2019-06-04 ENCOUNTER — Other Ambulatory Visit: Payer: Self-pay | Admitting: Primary Care

## 2019-06-04 DIAGNOSIS — I10 Essential (primary) hypertension: Secondary | ICD-10-CM

## 2019-06-04 DIAGNOSIS — R000 Tachycardia, unspecified: Secondary | ICD-10-CM

## 2019-06-08 ENCOUNTER — Encounter: Payer: Self-pay | Admitting: Primary Care

## 2019-06-08 ENCOUNTER — Other Ambulatory Visit: Payer: Self-pay

## 2019-06-08 ENCOUNTER — Ambulatory Visit (INDEPENDENT_AMBULATORY_CARE_PROVIDER_SITE_OTHER): Admitting: Primary Care

## 2019-06-08 VITALS — BP 122/78 | HR 82 | Temp 97.6°F | Ht 62.0 in | Wt 218.2 lb

## 2019-06-08 DIAGNOSIS — K219 Gastro-esophageal reflux disease without esophagitis: Secondary | ICD-10-CM | POA: Diagnosis not present

## 2019-06-08 DIAGNOSIS — H5789 Other specified disorders of eye and adnexa: Secondary | ICD-10-CM | POA: Insufficient documentation

## 2019-06-08 DIAGNOSIS — Z0000 Encounter for general adult medical examination without abnormal findings: Secondary | ICD-10-CM | POA: Diagnosis not present

## 2019-06-08 DIAGNOSIS — G4700 Insomnia, unspecified: Secondary | ICD-10-CM

## 2019-06-08 DIAGNOSIS — E785 Hyperlipidemia, unspecified: Secondary | ICD-10-CM

## 2019-06-08 DIAGNOSIS — E1165 Type 2 diabetes mellitus with hyperglycemia: Secondary | ICD-10-CM | POA: Diagnosis not present

## 2019-06-08 LAB — LIPID PANEL
Cholesterol: 196 mg/dL (ref 0–200)
HDL: 43.6 mg/dL (ref >39.00)
NonHDL: 152.37
Total CHOL/HDL Ratio: 4
Triglycerides: 221 mg/dL — ABNORMAL HIGH (ref 0.0–149.0)
VLDL: 44.2 mg/dL — ABNORMAL HIGH (ref 0.0–40.0)

## 2019-06-08 LAB — CBC
HCT: 38.5 % (ref 36.0–46.0)
Hemoglobin: 12.8 g/dL (ref 12.0–15.0)
MCHC: 33.3 g/dL (ref 30.0–36.0)
MCV: 89.9 fl (ref 78.0–100.0)
Platelets: 230 10*3/uL (ref 150.0–400.0)
RBC: 4.28 Mil/uL (ref 3.87–5.11)
RDW: 14.3 % (ref 11.5–15.5)
WBC: 8.7 10*3/uL (ref 4.0–10.5)

## 2019-06-08 LAB — COMPREHENSIVE METABOLIC PANEL
ALT: 23 U/L (ref 0–35)
AST: 19 U/L (ref 0–37)
Albumin: 4.1 g/dL (ref 3.5–5.2)
Alkaline Phosphatase: 86 U/L (ref 39–117)
BUN: 18 mg/dL (ref 6–23)
CO2: 27 mEq/L (ref 19–32)
Calcium: 9.3 mg/dL (ref 8.4–10.5)
Chloride: 101 mEq/L (ref 96–112)
Creatinine, Ser: 0.84 mg/dL (ref 0.40–1.20)
GFR: 71.42 mL/min (ref >60.00)
Glucose, Bld: 220 mg/dL — ABNORMAL HIGH (ref 70–99)
Potassium: 4 mEq/L (ref 3.5–5.1)
Sodium: 137 mEq/L (ref 135–145)
Total Bilirubin: 0.5 mg/dL (ref 0.2–1.2)
Total Protein: 6.9 g/dL (ref 6.0–8.3)

## 2019-06-08 LAB — HEMOGLOBIN A1C: Hgb A1c MFr Bld: 7.5 % — ABNORMAL HIGH (ref 4.6–6.5)

## 2019-06-08 LAB — LDL CHOLESTEROL, DIRECT: Direct LDL: 125 mg/dL

## 2019-06-08 MED ORDER — ERYTHROMYCIN 5 MG/GM OP OINT: 1.0000 "application " | TOPICAL_OINTMENT | Freq: Every day | OPHTHALMIC | 0 refills | Status: DC

## 2019-06-08 MED ORDER — TRAZODONE HCL 50 MG PO TABS: 25.0000 mg | ORAL_TABLET | Freq: Every evening | ORAL | 0 refills | Status: DC | PRN

## 2019-06-08 MED ORDER — TRULICITY 1.5 MG/0.5ML ~~LOC~~ SOAJ: 1.5000 mg | SUBCUTANEOUS | 0 refills | Status: DC

## 2019-06-08 NOTE — Assessment & Plan Note (Signed)
Zolpidem no longer very effective, especially since she's now working night shift. Will trial Trazodone HS as she's not tried anything else.  She will update.

## 2019-06-08 NOTE — Progress Notes (Signed)
Subjective:    Patient ID: Melissa [name], female    DOB: 05/12/1966, 53 y.o.   MRN: [id number]  HPI  [name] is a 53 year old female who presents today for complete physical.  Immunizations: -Tetanus: Unsure, within 10 years.  -Influenza: Completed this season -Shingles: Diagnosed with Shingles 2 months  -Pneumonia: Completed last in 2018  Diet: She endorses a poor diet. Exercise: She is not exercise, active at work.   Eye exam: No recent exam Dental exam: Completes semi-annually   Pap Smear: Completed in 2020 Mammogram: Completed in September 2020 Colonoscopy: Completed in 2015, due in 2025  BP Readings from Last 3 Encounters:  06/08/19 122/78  03/22/19 115/73  03/20/19 122/80     Review of Systems  Constitutional: Negative for unexpected weight change.  HENT: Negative for rhinorrhea.   Eyes: Positive for redness. Negative for itching.       Eye irritation   Respiratory: Negative for cough and shortness of breath.   Cardiovascular: Negative for chest pain.  Gastrointestinal: Negative for constipation and diarrhea.  Genitourinary: Negative for difficulty urinating.  Musculoskeletal: Negative for arthralgias and myalgias.  Skin: Negative for rash.  Allergic/Immunologic: Negative for environmental allergies.  Neurological: Negative for dizziness, numbness and headaches.  Psychiatric/Behavioral: Positive for sleep disturbance. The patient is not nervous/anxious.        Ambien no longer effective        Past Medical History:  Diagnosis Date  . Asthma   . Complication of anesthesia   . Diabetes mellitus without complication ([place])    Type II  . Dysrhythmia    Tachycardia  . GERD (gastroesophageal reflux disease)   . Pneumonia    2014- 'Walking"  . Shortness of breath dyspnea    with exertion     Social History   Socioeconomic History  . Marital status: Married    Spouse name: Not on file  . Number of children: Not on file  . Years of education: Not on  file  . Highest education level: Not on file  Occupational History  . Not on file  Tobacco Use  . Smoking status: Never Smoker  . Smokeless tobacco: Never Used  Substance and Sexual Activity  . Alcohol use: Yes    Alcohol/week: 0.0 standard drinks  . Drug use: No  . Sexual activity: Not on file  Other Topics Concern  . Not on file  Social History Narrative   Married.   1 daughter.   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    Social Determinants of Health   Financial Resource Strain:   . Difficulty of Paying Living Expenses: Not on file  Food Insecurity:   . Worried About Charity fundraiser in the Last Year: Not on file  . Ran Out of Food in the Last Year: Not on file  Transportation Needs:   . Lack of Transportation (Medical): Not on file  . Lack of Transportation (Non-Medical): Not on file  Physical Activity:   . Days of Exercise per Week: Not on file  . Minutes of Exercise per Session: Not on file  Stress:   . Feeling of Stress : Not on file  Social Connections:   . Frequency of Communication with Friends and Family: Not on file  . Frequency of Social Gatherings with Friends and Family: Not on file  . Attends Religious Services: Not on file  . Active Member of Clubs or Organizations: Not  on file  . Attends Archivist Meetings: Not on file  . Marital Status: Not on file  Intimate Partner Violence:   . Fear of Current or Ex-Partner: Not on file  . Emotionally Abused: Not on file  . Physically Abused: Not on file  . Sexually Abused: Not on file    Past Surgical History:  Procedure Laterality Date  . CHOLECYSTECTOMY  1997  . COLONOSCOPY  2015  . CYSTOSCOPY     age 89- stretched stem of bladder  . 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 SHOULDER ARTHROSCOPY Left 11/20/2014   Procedure: ARTHROSCOPY SHOULDER WITH MUA, ROTATOR INTERVAL RELEASE;  Surgeon: Meredith Pel, MD;  Location: [place];  Service: Orthopedics;  Laterality: Left;   LEFT SHOULDER MUA, DOA, ROTATOR INTERVAL RELEASE.  . TONSILLECTOMY  2010    Family History  Adopted: Yes  Problem Relation Age of Onset  . Other Other        adopted    Allergies  Allergen Reactions  . Amoxicillin Anaphylaxis and Other (See Comments)    Has patient had a PCN reaction causing immediate rash, facial/tongue/throat swelling, SOB or lightheadedness with hypotension: Yes Has patient had a PCN reaction causing severe rash involving mucus membranes or skin necrosis: No Has patient had a PCN reaction that required hospitalization No Has patient had a PCN reaction occurring within the last 10 years: No If all of the above answers are "NO", then may proceed with Cephalosporin use.  . Ciprofloxacin Anaphylaxis  . Erythromycin Anaphylaxis  . Other Anaphylaxis    Pt states that she can only take Avelox, Levaquin, and Zithromax.    Marland Kitchen Penicillins Anaphylaxis and Other (See Comments)    Has patient had a PCN reaction causing immediate rash, facial/tongue/throat swelling, SOB or lightheadedness with hypotension: Yes Has patient had a PCN reaction causing severe rash involving mucus membranes or skin necrosis: No Has patient had a PCN reaction that required hospitalization No Has patient had a PCN reaction occurring within the last 10 years: No If all of the above answers are "NO", then may proceed with Cephalosporin use.  . Sulfa Antibiotics Anaphylaxis  . Vicodin [Hydrocodone-Acetaminophen] Itching    Current Outpatient Medications on File Prior to Visit  Medication Sig Dispense Refill  . albuterol (PROVENTIL HFA;VENTOLIN HFA) 108 (90 Base) MCG/ACT inhaler Inhale 2 puffs into the lungs every 6 (six) hours as needed for wheezing or shortness of breath. 1 Inhaler 2  . blood glucose meter kit and supplies KIT Dispense based on patient and insurance preference. Use 1x a day. (FOR ICD-9 250.00, 250.01). 1 each 0  . BYSTOLIC 10 MG tablet TAKE 1 TABLET DAILY 90 tablet 0  . glucose  blood (FREESTYLE LITE) test strip Use as instructed, test blood sugar once a day 100 each 12  . Insulin Glargine (TOUJEO MAX SOLOSTAR) 300 UNIT/ML SOPN Inject 34 Units into the skin daily after supper. 9 pen 3  . Insulin Pen Needle 32G X 4 MM MISC Use 1x a day 100 each 3  . Lancets MISC Use 1x a day 100 each 3  . medroxyPROGESTERone (DEPO-PROVERA) 150 MG/ML injection Inject 150 mg into the muscle every 3 (three) months.    . metFORMIN (GLUCOPHAGE) 1000 MG tablet TAKE 1 TABLET TWICE A DAY WITH MEALS 180 tablet 3  . ondansetron (ZOFRAN ODT) 8 MG disintegrating tablet Take 1 tablet (8 mg total) by mouth 2 (two) times daily as needed for nausea or vomiting.  20 tablet 0  . ondansetron (ZOFRAN) 4 MG tablet Take 1 tablet (4 mg total) by mouth daily as needed. 14 tablet 0  . predniSONE (DELTASONE) 20 MG tablet Take 2 tablets (40 mg total) by mouth daily with breakfast. 14 tablet 0  . telmisartan (MICARDIS) 40 MG tablet Take 1 tablet (40 mg total) by mouth daily. For blood pressure. 90 tablet 3  . triamcinolone (KENALOG) 0.1 % paste Use as directed 1 application in the mouth or throat 2 (two) times daily. 5 g 0  . zolpidem (AMBIEN) 10 MG tablet Take 1 tablet (10 mg total) by mouth at bedtime as needed for sleep. 90 tablet 0   No current facility-administered medications on file prior to visit.    BP 122/78   Pulse 82   Temp 97.6 F (36.4 C) (Temporal)   Ht _0  (1.575 m)   Wt 218 lb 4 oz (99 kg)   SpO2 98%   BMI 39.92 kg/m    Objective:   Physical Exam  Constitutional: She is oriented to person, place, and time. She appears well-nourished.  HENT:  Right Ear: Tympanic membrane and ear canal normal.  Left Ear: Tympanic membrane and ear canal normal.  Mouth/Throat: Oropharynx is clear and moist.  Eyes: Pupils are equal, round, and reactive to light. EOM are normal.    Mild to moderate erythema with mild swelling to left upper eye lid. No crusting, drainage. Sclera white, conjunctiva appears  normal.  Right side with more of the same symptoms but mild.  Cardiovascular: Normal rate and regular rhythm.  Respiratory: Effort normal and breath sounds normal.  GI: Soft. Bowel sounds are normal. There is no abdominal tenderness.  Musculoskeletal:        General: Normal range of motion.     Cervical back: Neck supple.  Neurological: She is alert and oriented to person, place, and time. No cranial nerve deficit.  Reflex Scores:      Patellar reflexes are 2+ on the right side and 2+ on the left side. Skin: Skin is warm and dry.  Psychiatric: She has a normal mood and affect.           Assessment & Plan:

## 2019-06-08 NOTE — Assessment & Plan Note (Signed)
Question blepharitis? Does appear to me allergy induced but she's had no improvement with antihistamines orally and eye drops.  Rx for erythromycin ointment provided. She denies anaphylaxis to erythromycin, had GI upset with oral regimen years ago. She will update.

## 2019-06-08 NOTE — Assessment & Plan Note (Signed)
Infrequent symptoms, not managed on daily treatment. Avoid triggers.

## 2019-06-08 NOTE — Assessment & Plan Note (Signed)
Not on statin therapy. LDL pending.  Discussed the importance of a healthy diet and regular exercise in order for weight loss, and to reduce the risk of any potential medical problems.

## 2019-06-08 NOTE — Patient Instructions (Signed)
Stop by the lab prior to leaving today. I will notify you of your results once received.   Use the erythromycin ointment at bedtime for one week.  Try Trazodone 50 mg tablets for sleep, take about 30 minutes prior to bedtime. Can increase to two tablets if needed. Please update me.  Start exercising. You should be getting 150 minutes of moderate intensity exercise weekly.  It is important that you improve your diet. Please limit carbohydrates in the form of white bread, rice, pasta, sweets, fast food, fried food, sugary drinks, etc. Increase your consumption of fresh fruits and vegetables, whole grains, lean protein.  Ensure you are consuming 64 ounces of water daily.  It was a pleasure to see you today!   Preventive Care 85-59 Years Old, Female Preventive care refers to visits with your health care provider and lifestyle choices that can promote health and wellness. This includes:  A yearly physical exam. This may also be called an annual well check.  Regular dental visits and eye exams.  Immunizations.  Screening for certain conditions.  Healthy lifestyle choices, such as eating a healthy diet, getting regular exercise, not using drugs or products that contain nicotine and tobacco, and limiting alcohol use. What can I expect for my preventive care visit? Physical exam Your health care provider will check your:  Height and weight. This may be used to calculate body mass index (BMI), which tells if you are at a healthy weight.  Heart rate and blood pressure.  Skin for abnormal spots. Counseling Your health care provider may ask you questions about your:  Alcohol, tobacco, and drug use.  Emotional well-being.  Home and relationship well-being.  Sexual activity.  Eating habits.  Work and work Statistician.  Method of birth control.  Menstrual cycle.  Pregnancy history. What immunizations do I need?  Influenza (flu) vaccine  This is recommended every  year. Tetanus, diphtheria, and pertussis (Tdap) vaccine  You may need a Td booster every 10 years. Varicella (chickenpox) vaccine  You may need this if you have not been vaccinated. Zoster (shingles) vaccine  You may need this after age 14. Measles, mumps, and rubella (MMR) vaccine  You may need at least one dose of MMR if you were born in 1957 or later. You may also need a second dose. Pneumococcal conjugate (PCV13) vaccine  You may need this if you have certain conditions and were not previously vaccinated. Pneumococcal polysaccharide (PPSV23) vaccine  You may need one or two doses if you smoke cigarettes or if you have certain conditions. Meningococcal conjugate (MenACWY) vaccine  You may need this if you have certain conditions. Hepatitis A vaccine  You may need this if you have certain conditions or if you travel or work in places where you may be exposed to hepatitis A. Hepatitis B vaccine  You may need this if you have certain conditions or if you travel or work in places where you may be exposed to hepatitis B. Haemophilus influenzae type b (Hib) vaccine  You may need this if you have certain conditions. Human papillomavirus (HPV) vaccine  If recommended by your health care provider, you may need three doses over 6 months. You may receive vaccines as individual doses or as more than one vaccine together in one shot (combination vaccines). Talk with your health care provider about the risks and benefits of combination vaccines. What tests do I need? Blood tests  Lipid and cholesterol levels. These may be checked every 5 years, or more  frequently if you are over 20 years old.  Hepatitis C test.  Hepatitis B test. Screening  Lung cancer screening. You may have this screening every year starting at age 47 if you have a 30-pack-year history of smoking and currently smoke or have quit within the past 15 years.  Colorectal cancer screening. All adults should have this  screening starting at age 10 and continuing until age 64. Your health care provider may recommend screening at age 66 if you are at increased risk. You will have tests every 1-10 years, depending on your results and the type of screening test.  Diabetes screening. This is done by checking your blood sugar (glucose) after you have not eaten for a while (fasting). You may have this done every 1-3 years.  Mammogram. This may be done every 1-2 years. Talk with your health care provider about when you should start having regular mammograms. This may depend on whether you have a family history of breast cancer.  BRCA-related cancer screening. This may be done if you have a family history of breast, ovarian, tubal, or peritoneal cancers.  Pelvic exam and Pap test. This may be done every 3 years starting at age 6. Starting at age 60, this may be done every 5 years if you have a Pap test in combination with an HPV test. Other tests  Sexually transmitted disease (STD) testing.  Bone density scan. This is done to screen for osteoporosis. You may have this scan if you are at high risk for osteoporosis. Follow these instructions at home: Eating and drinking  Eat a diet that includes fresh fruits and vegetables, whole grains, lean protein, and low-fat dairy.  Take vitamin and mineral supplements as recommended by your health care provider.  Do not drink alcohol if: ? Your health care provider tells you not to drink. ? You are pregnant, may be pregnant, or are planning to become pregnant.  If you drink alcohol: ? Limit how much you have to 0-1 drink a day. ? Be aware of how much alcohol is in your drink. In the [place] one drink equals one 12 oz bottle of beer (355 mL), one 5 oz glass of wine (148 mL), or one 1 oz glass of hard liquor (44 mL). Lifestyle  Take daily care of your teeth and gums.  Stay active. Exercise for at least 30 minutes on 5 or more days each week.  Do not use any products that  contain nicotine or tobacco, such as cigarettes, e-cigarettes, and chewing tobacco. If you need help quitting, ask your health care provider.  If you are sexually active, practice safe sex. Use a condom or other form of birth control (contraception) in order to prevent pregnancy and STIs (sexually transmitted infections).  If told by your health care provider, take low-dose aspirin daily starting at age 16. What's next?  Visit your health care provider once a year for a well check visit.  Ask your health care provider how often you should have your eyes and teeth checked.  Stay up to date on all vaccines. This information is not intended to replace advice given to you by your health care provider. Make sure you discuss any questions you have with your health care provider. Document Released: 06/28/2015 Document Revised: 02/10/2018 Document Reviewed: 02/10/2018 Elsevier Patient Education  2020 [employer].

## 2019-06-08 NOTE — Assessment & Plan Note (Signed)
Tetanus UTD per patient. Influenza vaccination UTD. She had shingles two months ago, considered immune. Will vaccinate with Shingrix in one year.  Pap smear UTD, follows with GYN. Mammogram UTD. Colonoscopy UTD, due in 2025.  Discussed the importance of a healthy diet and regular exercise in order for weight loss, and to reduce the risk of any potential medical problems. Exam today as noted in PE. Labs pending.

## 2019-06-08 NOTE — Assessment & Plan Note (Signed)
Following with endocrinology, no longer on Toujeo. Compliant to Trulicity and metformin.  Repeat A1C pending. Discussed to schedule eye exam. Managed on ARB for renal protection.  Discussed the importance of a healthy diet and regular exercise in order for weight loss, and to reduce the risk of any potential medical problems.

## 2019-06-28 DIAGNOSIS — G4700 Insomnia, unspecified: Secondary | ICD-10-CM

## 2019-06-28 MED ORDER — TRAZODONE HCL 100 MG PO TABS: 100.0000 mg | ORAL_TABLET | Freq: Every day | ORAL | 0 refills | Status: DC

## 2019-09-07 ENCOUNTER — Other Ambulatory Visit: Payer: Self-pay | Admitting: Primary Care

## 2019-09-07 ENCOUNTER — Other Ambulatory Visit: Payer: Self-pay | Admitting: Internal Medicine

## 2019-09-07 DIAGNOSIS — I10 Essential (primary) hypertension: Secondary | ICD-10-CM

## 2019-09-07 DIAGNOSIS — R110 Nausea: Secondary | ICD-10-CM

## 2019-09-07 DIAGNOSIS — E1165 Type 2 diabetes mellitus with hyperglycemia: Secondary | ICD-10-CM

## 2019-09-07 DIAGNOSIS — R000 Tachycardia, unspecified: Secondary | ICD-10-CM

## 2019-09-08 NOTE — Telephone Encounter (Signed)
Ok to refill 

## 2019-09-08 NOTE — Telephone Encounter (Signed)
See my chart message

## 2019-10-05 ENCOUNTER — Other Ambulatory Visit: Payer: Self-pay | Admitting: Surgical Oncology

## 2019-10-05 DIAGNOSIS — K449 Diaphragmatic hernia without obstruction or gangrene: Secondary | ICD-10-CM

## 2019-10-13 LAB — HM DIABETES EYE EXAM

## 2019-10-17 ENCOUNTER — Encounter: Payer: Self-pay | Admitting: Primary Care

## 2019-10-30 ENCOUNTER — Other Ambulatory Visit: Payer: Self-pay | Admitting: Surgical Oncology

## 2019-10-30 ENCOUNTER — Ambulatory Visit
Admission: RE | Admit: 2019-10-30 | Discharge: 2019-10-30 | Disposition: A | Discharge status: Home / Self Care | Source: Ambulatory Visit | Attending: Surgical Oncology | Admitting: Surgical Oncology

## 2019-10-30 DIAGNOSIS — K449 Diaphragmatic hernia without obstruction or gangrene: Secondary | ICD-10-CM

## 2019-11-16 DIAGNOSIS — G4700 Insomnia, unspecified: Secondary | ICD-10-CM

## 2019-11-16 MED ORDER — TRAZODONE HCL 100 MG PO TABS: 100.0000 mg | ORAL_TABLET | Freq: Every day | ORAL | 0 refills | Status: DC

## 2019-11-21 ENCOUNTER — Other Ambulatory Visit: Payer: Self-pay

## 2019-11-21 ENCOUNTER — Emergency Department
Admission: EM | Admit: 2019-11-21 | Discharge: 2019-11-21 | Disposition: A | Discharge status: Home / Self Care | Attending: Emergency Medicine | Admitting: Emergency Medicine

## 2019-11-21 ENCOUNTER — Encounter: Payer: Self-pay | Admitting: Emergency Medicine

## 2019-11-21 DIAGNOSIS — R42 Dizziness and giddiness: Secondary | ICD-10-CM

## 2019-11-21 DIAGNOSIS — E1165 Type 2 diabetes mellitus with hyperglycemia: Secondary | ICD-10-CM | POA: Insufficient documentation

## 2019-11-21 DIAGNOSIS — N390 Urinary tract infection, site not specified: Secondary | ICD-10-CM | POA: Insufficient documentation

## 2019-11-21 DIAGNOSIS — Z794 Long term (current) use of insulin: Secondary | ICD-10-CM | POA: Insufficient documentation

## 2019-11-21 HISTORY — DX: Dizziness and giddiness: R42

## 2019-11-21 LAB — URINALYSIS, COMPLETE (UACMP) WITH MICROSCOPIC
Bilirubin Urine: NEGATIVE
Glucose, UA: NEGATIVE mg/dL
Hgb urine dipstick: NEGATIVE
Ketones, ur: NEGATIVE mg/dL
Nitrite: NEGATIVE
Protein, ur: NEGATIVE mg/dL
Specific Gravity, Urine: 1.019 (ref 1.005–1.030)
WBC, UA: >50 WBC/hpf — ABNORMAL HIGH (ref 0–5)
pH: 5 (ref 5.0–8.0)

## 2019-11-21 LAB — GLUCOSE, CAPILLARY: Glucose-Capillary: 125 mg/dL — ABNORMAL HIGH (ref 70–99)

## 2019-11-21 LAB — CBC
HCT: 41 % (ref 36.0–46.0)
Hemoglobin: 13.5 g/dL (ref 12.0–15.0)
MCH: 29.5 pg (ref 26.0–34.0)
MCHC: 32.9 g/dL (ref 30.0–36.0)
MCV: 89.5 fL (ref 80.0–100.0)
Platelets: 265 10*3/uL (ref 150–400)
RBC: 4.58 MIL/uL (ref 3.87–5.11)
RDW: 13.4 % (ref 11.5–15.5)
WBC: 7.1 10*3/uL (ref 4.0–10.5)
nRBC: 0 % (ref 0.0–0.2)

## 2019-11-21 LAB — BASIC METABOLIC PANEL
Anion gap: 9 (ref 5–15)
BUN: 14 mg/dL (ref 6–20)
CO2: 24 mmol/L (ref 22–32)
Calcium: 9 mg/dL (ref 8.9–10.3)
Chloride: 105 mmol/L (ref 98–111)
Creatinine, Ser: 0.79 mg/dL (ref 0.44–1.00)
GFR calc Af Amer: >60 mL/min (ref >60)
GFR calc non Af Amer: >60 mL/min (ref >60)
Glucose, Bld: 161 mg/dL — ABNORMAL HIGH (ref 70–99)
Potassium: 3.9 mmol/L (ref 3.5–5.1)
Sodium: 138 mmol/L (ref 135–145)

## 2019-11-21 LAB — TROPONIN I (HIGH SENSITIVITY): Troponin I (High Sensitivity): 3 ng/L (ref <18)

## 2019-11-21 MED ORDER — NITROFURANTOIN MONOHYD MACRO 100 MG PO CAPS: 100.0000 mg | ORAL_CAPSULE | Freq: Two times a day (BID) | ORAL | 0 refills | Status: AC

## 2019-11-21 MED ORDER — CEFTRIAXONE SODIUM 1 G IJ SOLR
1.0000 g | Freq: Once | INTRAMUSCULAR | Status: AC
  Administered 2019-11-21: 1 g via INTRAMUSCULAR
  Filled 2019-11-21: qty 10

## 2019-11-21 MED ORDER — MECLIZINE HCL 25 MG PO TABS: 25.0000 mg | ORAL_TABLET | Freq: Three times a day (TID) | ORAL | 0 refills | Status: DC | PRN

## 2019-11-21 MED ORDER — LIDOCAINE HCL (PF) 1 % IJ SOLN
5.0000 mL | Freq: Once | INTRAMUSCULAR | Status: AC
  Administered 2019-11-21: 5 mL
  Filled 2019-11-21: qty 5

## 2019-11-21 NOTE — ED Notes (Addendum)
See triage note  Presents with dizziness for couple of weeks  States the room spins in the morning   And then some episodes thur out the day  Also has had some lower back pain with some urinary freg

## 2019-11-21 NOTE — ED Triage Notes (Signed)
First nurse note- here for dizziness described as room spinning on and off for 2 weeks. Hx of vertigo but no episode in 10 years. Ambulatory with steady gait.

## 2019-11-21 NOTE — ED Triage Notes (Signed)
Patient to ER for c/o "the room spinning" type dizziness x2 weeks. Patient reports similar symptoms approx 10 years ago with diagnosis of vertigo. Was able to drive self to ER today, but states dizziness yesterday was too severe to drive.

## 2019-11-21 NOTE — Discharge Instructions (Addendum)
Follow-up with your primary care provider as needed.  Return to the emergency department if any severe worsening of your symptoms such as inability to take the antibiotic, fever, chills, nausea or vomiting.  Increase fluids.  Also begin taking the Antivert as needed for dizziness.  Do not drive or operate machinery while you are having episodes of dizziness as it may increase your chances for injury.

## 2019-11-21 NOTE — ED Provider Notes (Signed)
[facility] Emergency Department Provider Note  ____________________________________________   First MD Initiated Contact with Patient 11/21/19 1234     (approximate)  I have reviewed the triage vital signs and the nursing notes.   HISTORY  Chief Complaint Dizziness    HPI Melissa [name] is a 54 y.o. female presents to the ED with complaint of dizziness for approximately 2 weeks.  Patient states that episodes come and go overnight constant.  Patient also has had some low back pain with urinary frequency.  Patient states that she noticed 2 weeks ago that her urine was darker than usual but blamed it on her diabetes.  She also had frequency with urination but blames that on her diabetes.  She denies any fever or chills.  Patient does not routinely have urinary tract infections.  She has had a history of dizziness 10 years ago.  She denies any pain at this time.       Past Medical History:  Diagnosis Date  . Asthma   . Complication of anesthesia   . Diabetes mellitus without complication ([place])    Type II  . Dysrhythmia    Tachycardia  . GERD (gastroesophageal reflux disease)   . Pneumonia    2014- 'Walking"  . Shortness of breath dyspnea    with exertion  . Vertigo     Patient Active Problem List   Diagnosis Date Noted  . Preventative health care 06/08/2019  . Eye irritation 06/08/2019  . Facial swelling 03/13/2019  . Tongue lesion 03/13/2019  . Dyslipidemia 04/07/2018  . Class 3 severe obesity with serious comorbidity and body mass index (BMI) of 40.0 to 44.9 in adult ([place]) 04/07/2018  . Abnormal findings on examination of skull and head 04/26/2017  . Vasovagal syncope 04/26/2017  . Lymphadenitis 11/02/2016  . Vertigo 11/02/2016  . Other fatigue 03/23/2016  . Insomnia 03/23/2016  . Nausea without vomiting 06/07/2015  . Type 2 diabetes mellitus ([place]) 06/07/2015  . GERD (gastroesophageal reflux disease) 06/07/2015  . Tachycardia 06/07/2015     Past Surgical History:  Procedure Laterality Date  . CHOLECYSTECTOMY  1997  . COLONOSCOPY  2015  . CYSTOSCOPY     age 77- stretched stem of bladder  . 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 SHOULDER ARTHROSCOPY Left 11/20/2014   Procedure: ARTHROSCOPY SHOULDER WITH MUA, ROTATOR INTERVAL RELEASE;  Surgeon: Meredith Pel, MD;  Location: [place];  Service: Orthopedics;  Laterality: Left;  LEFT SHOULDER MUA, DOA, ROTATOR INTERVAL RELEASE.  . TONSILLECTOMY  2010    Prior to Admission medications   Medication Sig Start Date End Date Taking? Authorizing Provider  albuterol (PROVENTIL HFA;VENTOLIN HFA) 108 (90 Base) MCG/ACT inhaler Inhale 2 puffs into the lungs every 6 (six) hours as needed for wheezing or shortness of breath. 07/07/18   Baity, Coralie Keens, NP  blood glucose meter kit and supplies KIT Dispense based on patient and insurance preference. Use 1x a day. (FOR ICD-9 250.00, 250.01). 04/07/18   Philemon Kingdom, MD  glucose blood (FREESTYLE LITE) test strip Use as instructed, test blood sugar once a day 06/22/18   Philemon Kingdom, MD  Insulin Pen Needle 32G X 4 MM MISC Use 1x a day 04/07/18   Philemon Kingdom, MD  Lancets MISC Use 1x a day 04/07/18   Philemon Kingdom, MD  meclizine (ANTIVERT) 25 MG tablet Take 1 tablet (25 mg total) by mouth 3 (three) times daily as needed for dizziness. 11/21/19   Madalyn Rob,  Kensly Bowmer L, PA-C  medroxyPROGESTERone (DEPO-PROVERA) 150 MG/ML injection Inject 150 mg into the muscle every 3 (three) months.    [provider]  metFORMIN (GLUCOPHAGE) 1000 MG tablet TAKE 1 TABLET TWICE A DAY WITH MEALS 02/27/19   Philemon Kingdom, MD  nebivolol (BYSTOLIC) 10 MG tablet Take 1 tablet (10 mg total) by mouth daily. 09/12/19   Pleas Koch, NP  nitrofurantoin, macrocrystal-monohydrate, (MACROBID) 100 MG capsule Take 1 capsule (100 mg total) by mouth 2 (two) times daily for 7 days. 11/21/19 11/28/19  Letitia Neri L, PA-C  ondansetron (ZOFRAN-ODT) 8 MG  disintegrating tablet PLACE 1 TABLET ON THE TONGUE TWICE A DAY AS NEEDED FOR NAUSEA OR VOMITING 09/12/19   Pleas Koch, NP  predniSONE (DELTASONE) 20 MG tablet Take 2 tablets (40 mg total) by mouth daily with breakfast. 03/20/19   Jearld Fenton, NP  telmisartan (MICARDIS) 40 MG tablet Take 1 tablet (40 mg total) by mouth daily. For blood pressure. 03/13/19   Pleas Koch, NP  traZODone (DESYREL) 100 MG tablet Take 1 tablet (100 mg total) by mouth at bedtime. For sleep. 11/16/19   Pleas Koch, NP  triamcinolone (KENALOG) 0.1 % paste Use as directed 1 application in the mouth or throat 2 (two) times daily. 03/13/19   Pleas Koch, NP  TRULICITY 1.5 XI/3.3AS SOPN INJECT 1.5 MG INTO THE SKIN ONCE A WEEK FOR DIABETES 09/12/19   Pleas Koch, NP    Allergies Amoxicillin, Ciprofloxacin, Other, Penicillins, Sulfa antibiotics, Erythromycin, and Vicodin [hydrocodone-acetaminophen]  Family History  Adopted: Yes  Problem Relation Age of Onset  . Other Other        adopted    Social History Social History   Tobacco Use  . Smoking status: Never Smoker  . Smokeless tobacco: Never Used  Substance Use Topics  . Alcohol use: Yes    Alcohol/week: 0.0 standard drinks  . Drug use: No    Review of Systems Constitutional: No fever/chills.  Positive for dizziness. Eyes: No visual changes. ENT: No sore throat. Cardiovascular: Denies chest pain. Respiratory: Denies shortness of breath. Gastrointestinal: No abdominal pain.  No nausea, no vomiting.   Genitourinary: Positive for frequency. Musculoskeletal: Positive low back pain. Skin: Negative for rash. Neurological: Negative for headaches, focal weakness or numbness. ___________________________________________   PHYSICAL EXAM:  VITAL SIGNS: ED Triage Vitals [11/21/19 1131]  Enc Vitals Group     BP 131/77     Pulse Rate 81     Resp 18     Temp 98.1 F (36.7 C)     Temp Source Oral     SpO2 98 %     Weight 217 lb  (98.4 kg)     Height 5' (1.524 m)     Head Circumference      Peak Flow      Pain Score 0     Pain Loc      Pain Edu?      Excl. in [place]?    Constitutional: Alert and oriented. Well appearing and in no acute distress. Eyes: Conjunctivae are normal. PERRL. EOMI. no nystagmus noted. Head: Atraumatic. Neck: No stridor.   Cardiovascular: Normal rate, regular rhythm. Grossly normal heart sounds.  Good peripheral circulation. Respiratory: Normal respiratory effort.  No retractions. Lungs CTAB. Gastrointestinal: Soft and nontender. No distention. No CVA tenderness. Musculoskeletal: Moves left upper and lower extremities they have difficulty normal gait was noted. Neurologic:  Normal speech and language. No gross focal neurologic deficits are  appreciated. No gait instability. Skin:  Skin is warm, dry and intact. No rash noted. Psychiatric: Mood and affect are normal. Speech and behavior are normal.  ____________________________________________   LABS (all labs ordered are listed, but only abnormal results are displayed)  Labs Reviewed  BASIC METABOLIC PANEL - Abnormal; Notable for the following components:      Result Value   Glucose, Bld 161 (*)    All other components within normal limits  URINALYSIS, COMPLETE (UACMP) WITH MICROSCOPIC - Abnormal; Notable for the following components:   Color, Urine YELLOW (*)    APPearance CLOUDY (*)    Leukocytes,Ua LARGE (*)    WBC, UA >50 (*)    Bacteria, UA MANY (*)    Non Squamous Epithelial PRESENT (*)    All other components within normal limits  GLUCOSE, CAPILLARY - Abnormal; Notable for the following components:   Glucose-Capillary 125 (*)    All other components within normal limits  CBC  CBG MONITORING, ED  TROPONIN I (HIGH SENSITIVITY)    PROCEDURES  Procedure(s) performed (including Critical Care):  Procedures   ____________________________________________   INITIAL IMPRESSION / ASSESSMENT AND PLAN / ED COURSE  As part  of my medical decision making, I reviewed the following data within the electronic MEDICAL RECORD NUMBER Notes from prior ED visits and [place] Controlled Substance Database  54 year old female presents to the ED with complaint of urinary symptoms that started approximately 2 weeks ago that she ignored thinking that it was part of her diabetes.  She states that initially her urine was dark and she began drinking more fluids.  She then noticed some frequency but also felt that this was due to her diabetes and also increase of fluids.  Patient denies any fever, chills, nausea or vomiting.  She also has history of dizziness for the last 2 weeks that is intermittent and similar to her episode 10 years ago.  Urinalysis did show an acute UTI.  Patient states that she has had Rocephin in the past without any reactions but is allergic to multiple antibiotics.  Rocephin 1 g IM was given in the ED.  Macrobid was sent to the pharmacy for her to continue twice daily for the next 7 days.  She is to increase fluids.  A prescription for Antivert 25 mg was given as needed for dizziness.  She is to follow-up with her PCP if any continued problems.  She is to return to the emergency department if any worsening of her symptoms.  ____________________________________________   FINAL CLINICAL IMPRESSION(S) / ED DIAGNOSES  Final diagnoses:  Acute urinary tract infection  Dizziness     ED Discharge Orders         Ordered    nitrofurantoin, macrocrystal-monohydrate, (MACROBID) 100 MG capsule  2 times daily     11/21/19 1331    meclizine (ANTIVERT) 25 MG tablet  3 times daily PRN     11/21/19 1331           Note:  This document was prepared using Dragon voice recognition software and may include unintentional dictation errors.    Johnn Hai, PA-C 11/21/19 1441    Earleen Newport, MD 11/21/19 1500

## 2019-12-26 ENCOUNTER — Emergency Department (HOSPITAL_COMMUNITY)

## 2019-12-26 ENCOUNTER — Encounter (HOSPITAL_COMMUNITY): Payer: Self-pay | Admitting: Emergency Medicine

## 2019-12-26 ENCOUNTER — Other Ambulatory Visit (HOSPITAL_COMMUNITY): Payer: Self-pay | Admitting: *Deleted

## 2019-12-26 ENCOUNTER — Other Ambulatory Visit: Payer: Self-pay | Admitting: Cardiology

## 2019-12-26 ENCOUNTER — Other Ambulatory Visit: Payer: Self-pay

## 2019-12-26 ENCOUNTER — Emergency Department (HOSPITAL_COMMUNITY)
Admission: EM | Admit: 2019-12-26 | Discharge: 2019-12-26 | Disposition: A | Discharge status: Home / Self Care | Attending: Emergency Medicine | Admitting: Emergency Medicine

## 2019-12-26 DIAGNOSIS — I959 Hypotension, unspecified: Secondary | ICD-10-CM

## 2019-12-26 DIAGNOSIS — R079 Chest pain, unspecified: Secondary | ICD-10-CM

## 2019-12-26 DIAGNOSIS — R55 Syncope and collapse: Secondary | ICD-10-CM | POA: Insufficient documentation

## 2019-12-26 DIAGNOSIS — Z20822 Contact with and (suspected) exposure to covid-19: Secondary | ICD-10-CM | POA: Insufficient documentation

## 2019-12-26 DIAGNOSIS — R0789 Other chest pain: Secondary | ICD-10-CM | POA: Insufficient documentation

## 2019-12-26 DIAGNOSIS — Z79899 Other long term (current) drug therapy: Secondary | ICD-10-CM | POA: Insufficient documentation

## 2019-12-26 DIAGNOSIS — R109 Unspecified abdominal pain: Secondary | ICD-10-CM | POA: Insufficient documentation

## 2019-12-26 DIAGNOSIS — E119 Type 2 diabetes mellitus without complications: Secondary | ICD-10-CM | POA: Insufficient documentation

## 2019-12-26 DIAGNOSIS — J45909 Unspecified asthma, uncomplicated: Secondary | ICD-10-CM | POA: Insufficient documentation

## 2019-12-26 DIAGNOSIS — Z794 Long term (current) use of insulin: Secondary | ICD-10-CM | POA: Diagnosis not present

## 2019-12-26 LAB — URINALYSIS, ROUTINE W REFLEX MICROSCOPIC
Bilirubin Urine: NEGATIVE
Glucose, UA: >=500 mg/dL — AB
Hgb urine dipstick: NEGATIVE
Ketones, ur: NEGATIVE mg/dL
Nitrite: NEGATIVE
Protein, ur: NEGATIVE mg/dL
Specific Gravity, Urine: >1.046 — ABNORMAL HIGH (ref 1.005–1.030)
pH: 5 (ref 5.0–8.0)

## 2019-12-26 LAB — CBC WITH DIFFERENTIAL/PLATELET
Abs Immature Granulocytes: 0.06 10*3/uL (ref 0.00–0.07)
Basophils Absolute: 0.1 10*3/uL (ref 0.0–0.1)
Basophils Relative: 1 %
Eosinophils Absolute: 0.3 10*3/uL (ref 0.0–0.5)
Eosinophils Relative: 3 %
HCT: 41.7 % (ref 36.0–46.0)
Hemoglobin: 13.4 g/dL (ref 12.0–15.0)
Immature Granulocytes: 1 %
Lymphocytes Relative: 28 %
Lymphs Abs: 3.2 10*3/uL (ref 0.7–4.0)
MCH: 29.7 pg (ref 26.0–34.0)
MCHC: 32.1 g/dL (ref 30.0–36.0)
MCV: 92.5 fL (ref 80.0–100.0)
Monocytes Absolute: 1 10*3/uL (ref 0.1–1.0)
Monocytes Relative: 9 %
Neutro Abs: 6.6 10*3/uL (ref 1.7–7.7)
Neutrophils Relative %: 58 %
Platelets: 281 10*3/uL (ref 150–400)
RBC: 4.51 MIL/uL (ref 3.87–5.11)
RDW: 13.3 % (ref 11.5–15.5)
WBC: 11.2 10*3/uL — ABNORMAL HIGH (ref 4.0–10.5)
nRBC: 0 % (ref 0.0–0.2)

## 2019-12-26 LAB — COMPREHENSIVE METABOLIC PANEL
ALT: 26 U/L (ref 0–44)
AST: 24 U/L (ref 15–41)
Albumin: 3.6 g/dL (ref 3.5–5.0)
Alkaline Phosphatase: 80 U/L (ref 38–126)
Anion gap: 13 (ref 5–15)
BUN: 20 mg/dL (ref 6–20)
CO2: 20 mmol/L — ABNORMAL LOW (ref 22–32)
Calcium: 8.8 mg/dL — ABNORMAL LOW (ref 8.9–10.3)
Chloride: 103 mmol/L (ref 98–111)
Creatinine, Ser: 1.02 mg/dL — ABNORMAL HIGH (ref 0.44–1.00)
GFR calc Af Amer: >60 mL/min (ref >60)
GFR calc non Af Amer: >60 mL/min (ref >60)
Glucose, Bld: 272 mg/dL — ABNORMAL HIGH (ref 70–99)
Potassium: 3.9 mmol/L (ref 3.5–5.1)
Sodium: 136 mmol/L (ref 135–145)
Total Bilirubin: 0.5 mg/dL (ref 0.3–1.2)
Total Protein: 6.9 g/dL (ref 6.5–8.1)

## 2019-12-26 LAB — TROPONIN I (HIGH SENSITIVITY)
Troponin I (High Sensitivity): 3 ng/L (ref <18)
Troponin I (High Sensitivity): 3 ng/L (ref <18)

## 2019-12-26 LAB — SARS CORONAVIRUS 2 BY RT PCR (HOSPITAL ORDER, PERFORMED IN ~~LOC~~ HOSPITAL LAB): SARS Coronavirus 2: NEGATIVE

## 2019-12-26 LAB — CBG MONITORING, ED: Glucose-Capillary: 282 mg/dL — ABNORMAL HIGH (ref 70–99)

## 2019-12-26 LAB — I-STAT BETA HCG BLOOD, ED (MC, WL, AP ONLY): I-stat hCG, quantitative: <5 m[IU]/mL (ref <5)

## 2019-12-26 MED ORDER — ONDANSETRON HCL 4 MG/2ML IJ SOLN
4.0000 mg | Freq: Once | INTRAMUSCULAR | Status: AC
  Administered 2019-12-26: 4 mg via INTRAVENOUS
  Filled 2019-12-26: qty 2

## 2019-12-26 MED ORDER — ACETAMINOPHEN 500 MG PO TABS
1000.0000 mg | ORAL_TABLET | Freq: Once | ORAL | Status: AC
  Administered 2019-12-26: 1000 mg via ORAL
  Filled 2019-12-26: qty 2

## 2019-12-26 MED ORDER — SODIUM CHLORIDE 0.9 % IV BOLUS (SEPSIS)
1000.0000 mL | Freq: Once | INTRAVENOUS | Status: AC
  Administered 2019-12-26: 1000 mL via INTRAVENOUS

## 2019-12-26 MED ORDER — ASPIRIN 81 MG PO CHEW
324.0000 mg | CHEWABLE_TABLET | Freq: Once | ORAL | Status: AC
  Administered 2019-12-26: 324 mg via ORAL
  Filled 2019-12-26: qty 4

## 2019-12-26 MED ORDER — IOHEXOL 350 MG/ML SOLN
100.0000 mL | Freq: Once | INTRAVENOUS | Status: AC | PRN
  Administered 2019-12-26: 100 mL via INTRAVENOUS

## 2019-12-26 MED ORDER — METOPROLOL TARTRATE 100 MG PO TABS
ORAL_TABLET | ORAL | 0 refills | Status: DC
Start: 2019-12-26 — End: 2020-01-16

## 2019-12-26 NOTE — ED Notes (Signed)
Breakfast Tray Ordered @ [id number].

## 2019-12-26 NOTE — ED Provider Notes (Addendum)
TIME SEEN: 2:29 AM  CHIEF COMPLAINT: Near syncope  HPI: Patient is a 54 year old female with history of sinus tachycardia on Bystolic, type 2 diabetes, obesity who presents to the emergency department after a near syncopal event that occurred just prior to arrival.  Patient is a nurse with day surgery.  States she was sitting down at work when she suddenly felt everything go black.  States she does not think that she lost consciousness.  States she was pale and diaphoretic.  Reports that coworker checked her blood pressure and her systolic was in the 51O.  She states that she started having tightness in her throat, left side of her chest that radiated into her back.  Reports she also has shortness of breath.  She describes the back pain is dull.  She also began having a diffuse headache.  States she feels heavy and weak all over but no focal numbness, tingling or weakness.  She states she immediately felt the urge to go to the bathroom and have a bowel movement.  States she was able to go to the bathroom and did have diarrhea but states this is a chronic issue for her.  No bloody stools or melena.  She does states she has diffuse abdominal pain as well that started after this episode.  When she came back from the bathroom her coworker rechecked her blood pressure and it was in the 84Z systolic.  She does not normally have issues with her blood pressure.  She states that she did start clindamycin, Flagyl and Protonix yesterday for H. pylori.  She has had 2 to 3 weeks of nausea which she contributes to this but no vomiting.  Denies any fevers.  States she did have some dry cough after this episode.  No history of PE or DVT.  No history of AAA, dissection.  Unknown family hx as pt is adopted.  No h/o tobacco use.  ROS: See HPI Constitutional: no fever  Eyes: no drainage  ENT: no runny nose   Cardiovascular:   chest pain  Resp:  SOB  GI: no vomiting; + nausea and diarrhea GU: no dysuria Integumentary: no  rash  Allergy: no hives  Musculoskeletal: no leg swelling  Neurological: no slurred speech ROS otherwise negative  PAST MEDICAL HISTORY/PAST SURGICAL HISTORY:  Past Medical History:  Diagnosis Date   Asthma    Complication of anesthesia    Diabetes mellitus without complication (HCC)    Type II   Dysrhythmia    Tachycardia   GERD (gastroesophageal reflux disease)    Pneumonia    2014- 'Walking"   Shortness of breath dyspnea    with exertion   Vertigo     MEDICATIONS:  Prior to Admission medications   Medication Sig Start Date End Date Taking? Authorizing Provider  albuterol (PROVENTIL HFA;VENTOLIN HFA) 108 (90 Base) MCG/ACT inhaler Inhale 2 puffs into the lungs every 6 (six) hours as needed for wheezing or shortness of breath. 07/07/18   Baity, Coralie Keens, NP  blood glucose meter kit and supplies KIT Dispense based on patient and insurance preference. Use 1x a day. (FOR ICD-9 250.00, 250.01). 04/07/18   Philemon Kingdom, MD  glucose blood (FREESTYLE LITE) test strip Use as instructed, test blood sugar once a day 06/22/18   Philemon Kingdom, MD  Insulin Pen Needle 32G X 4 MM MISC Use 1x a day 04/07/18   Philemon Kingdom, MD  Lancets MISC Use 1x a day 04/07/18   Philemon Kingdom, MD  meclizine (  ANTIVERT) 25 MG tablet Take 1 tablet (25 mg total) by mouth 3 (three) times daily as needed for dizziness. 11/21/19   Johnn Hai, PA-C  medroxyPROGESTERone (DEPO-PROVERA) 150 MG/ML injection Inject 150 mg into the muscle every 3 (three) months.    [provider]  metFORMIN (GLUCOPHAGE) 1000 MG tablet TAKE 1 TABLET TWICE A DAY WITH MEALS 02/27/19   Philemon Kingdom, MD  nebivolol (BYSTOLIC) 10 MG tablet Take 1 tablet (10 mg total) by mouth daily. 09/12/19   Pleas Koch, NP  ondansetron (ZOFRAN-ODT) 8 MG disintegrating tablet PLACE 1 TABLET ON THE TONGUE TWICE A DAY AS NEEDED FOR NAUSEA OR VOMITING 09/12/19   Pleas Koch, NP  predniSONE (DELTASONE) 20 MG tablet  Take 2 tablets (40 mg total) by mouth daily with breakfast. 03/20/19   Jearld Fenton, NP  telmisartan (MICARDIS) 40 MG tablet Take 1 tablet (40 mg total) by mouth daily. For blood pressure. 03/13/19   Pleas Koch, NP  traZODone (DESYREL) 100 MG tablet Take 1 tablet (100 mg total) by mouth at bedtime. For sleep. 11/16/19   Pleas Koch, NP  triamcinolone (KENALOG) 0.1 % paste Use as directed 1 application in the mouth or throat 2 (two) times daily. 03/13/19   Pleas Koch, NP  TRULICITY 1.5 OL/0.7EM SOPN INJECT 1.5 MG INTO THE SKIN ONCE A WEEK FOR DIABETES 09/12/19   Pleas Koch, NP    ALLERGIES:  Allergies  Allergen Reactions   Amoxicillin Anaphylaxis and Other (See Comments)    Has patient had a PCN reaction causing immediate rash, facial/tongue/throat swelling, SOB or lightheadedness with hypotension: Yes Has patient had a PCN reaction causing severe rash involving mucus membranes or skin necrosis: No Has patient had a PCN reaction that required hospitalization No Has patient had a PCN reaction occurring within the last 10 years: No If all of the above answers are "NO", then may proceed with Cephalosporin use.   Ciprofloxacin Anaphylaxis   Other Anaphylaxis    Pt states that she can only take Avelox, Levaquin, and Zithromax.     Penicillins Anaphylaxis and Other (See Comments)    Has patient had a PCN reaction causing immediate rash, facial/tongue/throat swelling, SOB or lightheadedness with hypotension: Yes Has patient had a PCN reaction causing severe rash involving mucus membranes or skin necrosis: No Has patient had a PCN reaction that required hospitalization No Has patient had a PCN reaction occurring within the last 10 years: No If all of the above answers are "NO", then may proceed with Cephalosporin use.   Sulfa Antibiotics Anaphylaxis   Erythromycin Other (See Comments)    GI upset with oral regimen over 10 years ago   Vicodin  [Hydrocodone-Acetaminophen] Itching    SOCIAL HISTORY:  Social History   Tobacco Use   Smoking status: Never Smoker   Smokeless tobacco: Never Used  Substance Use Topics   Alcohol use: Yes    Alcohol/week: 0.0 standard drinks    FAMILY HISTORY: Family History  Adopted: Yes  Problem Relation Age of Onset   Other Other        adopted    EXAM: BP 112/75 (BP Location: Right Arm)    Pulse 93    Temp 98.1 F (36.7 C) (Oral)    Resp 20    SpO2 100%  CONSTITUTIONAL: Alert and oriented and responds appropriately to questions. Well-appearing; well-nourished HEAD: Normocephalic, atraumatic EYES: Conjunctivae clear, pupils appear equal, EOM appear intact ENT: normal nose; moist mucous  membranes NECK: Supple, normal ROM CARD: RRR; S1 and S2 appreciated; no murmurs, no clicks, no rubs, no gallops RESP: Normal chest excursion without splinting or tachypnea; breath sounds clear and equal bilaterally; no wheezes, no rhonchi, no rales, no hypoxia or respiratory distress, speaking full sentences ABD/GI: Normal bowel sounds; non-distended; soft, non-tender, no rebound, no guarding, no peritoneal signs, no hepatosplenomegaly BACK:  The back appears normal EXT: Normal ROM in all joints; no deformity noted, no edema; no cyanosis, no calf tenderness or calf swelling SKIN: Normal color for age and race; warm; no rash on exposed skin NEURO: Moves all extremities equally, strength 5/5 in all 4 extremities, sensation to light touch intact diffusely, cranial nerves II through XII intact, normal speech PSYCH: The patient's mood and manner are appropriate.   MEDICAL DECISION MAKING: Patient here with near syncopal event with sudden onset headache as well as chest pain that radiates into the back and abdominal pain.  Differential is large including intracranial hemorrhage, AAA, dissection, ACS, PE, dehydration, infection.  Will obtain labs, urine, CT of the head, CTA of the chest/abdomen/pelvis.  Patient  requesting Tylenol for headache.  Will also give IV fluids, Zofran.  EKG shows no new ischemic changes.  Blood glucose prior to coming down to the ED was in the 220s.  Will recheck.  ED PROGRESS: Patient's labs show elevated blood glucose and bicarb of 20 but normal anion gap.  Troponin is negative.  Electrolytes within normal limits.  Blood counts normal.  CT head shows no acute abnormality and CTA of the chest, abdomen and pelvis shows no dissection, aneurysm, PE or other acute abnormality other than "diarrheal state" which she reports is chronic.  She does not feel that she has had enough diarrhea to become dehydrated.  We have discussed concerns regarding her diaphoresis, chest tightness, shortness of breath that has improved.  We held nitroglycerin due to hypotension.  Will give aspirin.  Discussed admission versus discharge with close outpatient follow-up.  Patient would prefer observation in the hospital.  PCP is with [facility].  Will d/w hospitalist.  5:19 AM Discussed patient's case with hospitalist, Dr. Marice Potter.  Hospitalist to see patient in the ED to determine if admission is necessary at this time.  Second troponin is pending.  I reviewed all nursing notes, vitals, pertinent previous records and reviewed/interpreted all EKGs, lab and urine results, imaging (as available).  5:45 AM  Dr. Marice Potter has seen patient and spoke with cardiologist, Dr. Ailene Ravel.  Cardiology to see in ED for dispo.  Patient comfortable with plan.       EKG Interpretation  Date/Time:  Tuesday December 26 2019 01:52:53 EDT Ventricular Rate:  90 PR Interval:  152 QRS Duration: 64 QT Interval:  352 QTC Calculation: 430 R Axis:   45 Text Interpretation: Normal sinus rhythm Low voltage QRS Cannot rule out Anterior infarct , age undetermined Abnormal ECG No significant change since last tracing Confirmed by Pryor Curia [phone]) on 12/26/2019 2:03:29 AM         Burnice Beacher May was evaluated in Emergency Department on 12/26/2019  for the symptoms described in the history of present illness. She was evaluated in the context of the global COVID-19 pandemic, which necessitated consideration that the patient might be at risk for infection with the SARS-CoV-2 virus that causes COVID-19. Institutional protocols and algorithms that pertain to the evaluation of patients at risk for COVID-19 are in a state of rapid change based on information released by regulatory bodies including the  CDC and federal and [employer]. These policies and algorithms were followed during the patient's care in the ED.      Keyandre Pileggi, Delice Bison, DO 12/26/19 [place], Delice Bison, DO 12/26/19 [phone]

## 2019-12-26 NOTE — ED Provider Notes (Signed)
This patient has been seen by myself, she is well-appearing, vital signs and labs have come back reassuring, cardiology has cleared her for discharge and will arrange outpatient follow-up.  Stable for discharge   Eber Hong, MD 12/26/19 1048

## 2019-12-26 NOTE — Progress Notes (Signed)
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 Initial workup unremarkable and patient now asymptomatic. Requested to admit for possible ACS rule-out/stress test. Heart Score 2-3; patient agreeable to DC home. D/w Cardiology, they will come see patient and decide whether she needs Echo/Stress test and if so, ER will page back for admission. D/w Dr. Elesa Massed and also in agreement with plan. Will order a Trop for 1200 to further trend.   Joselyn Arrow, MD [facility] Pager [phone]

## 2019-12-26 NOTE — Discharge Instructions (Addendum)
Please start Aspirin 81 mg daily  Call Dr. Marylene Buerger office if you have further problems   [phone]

## 2019-12-26 NOTE — Consult Note (Signed)
Cardiology Consultation:   Patient ID: Melissa [name] MRN: [id number]; DOB: Oct 31, 1965  Admit date: 12/26/2019 Date of Consult: 12/26/2019  Primary Care Provider: Pleas Koch, NP [facility] Cardiologist: No primary care provider on file. new [facility] Electrophysiologist:  None    Patient Profile:   Melissa [name] is a 54 y.o. female with a hx of DM-2, asthma, elevated lipids, hx of syncope as teenager associated with syncope, tachycardia on bystolic who is being seen today for the evaluation of chest pressure and near syncope at the request of Dr. Sabra Heck.  History of Present Illness:   Melissa [name] with above hx and wore a holter monitor 2013 with rare PACs, hx of tachycardia at rest HR before bystolic 824.  Echo then with EF 55-60%, LV filling pattern is impaired, tr MR .  Hx of stress echo 2-3 years ago was normal.  She presented to ER early AM today with dizziness and near syncope she had diarrhea once and BP was 60/39.  She is on ABX for H pylori   She has chronic diarrhea.  Has been diabetic for 24 years, has not wanted any cholesterol meds.  She is adopted and no known FH.    Pt was sitting at work had been eating and drinking normally the day before, usually active with riding bike and kayaking no chest pressure with activity, she just felt she would pass out , everything became dark no racing HR, + chest pressure.   Then she needed to Have BM and felt stable to go to BR, BP then up to 97 systolic.  She came to the ER  EKG:  The EKG was personally reviewed and demonstrates:  SR with no acute changes from 2019.  Telemetry:  Telemetry was personally reviewed and demonstrates:  SR  BP 93/37 to 111/46 BP lying 99/68 P 88 Sitting 110/69 P 83 Standing 100/52 P 68    Has rec'd 2 L of fluid and can walk to BR without dizziness or chest pressure.    Troponin 3 and 3,  Na 136, K+ 3.9 glucose 272 Cr 1.02  WBC 11.2 Hgb 13.4 plts 281 U/a with large to moderate leukocytes   CT of  chest neg for aortic dissection   CT head without abnormality.   Past Medical History:  Diagnosis Date  . Asthma   . Complication of anesthesia   . Diabetes mellitus without complication ([place])    Type II  . Dysrhythmia    Tachycardia  . GERD (gastroesophageal reflux disease)   . Pneumonia    2014- 'Walking"  . Shortness of breath dyspnea    with exertion  . Vertigo     Past Surgical History:  Procedure Laterality Date  . CHOLECYSTECTOMY  1997  . COLONOSCOPY  2015  . CYSTOSCOPY     age 3- stretched stem of bladder  . 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 SHOULDER ARTHROSCOPY Left 11/20/2014   Procedure: ARTHROSCOPY SHOULDER WITH MUA, ROTATOR INTERVAL RELEASE;  Surgeon: Meredith Pel, MD;  Location: [facility];  Service: Orthopedics;  Laterality: Left;  LEFT SHOULDER MUA, DOA, ROTATOR INTERVAL RELEASE.  . TONSILLECTOMY  2010     Home Medications:  Prior to Admission medications   Medication Sig Start Date End Date Taking? Authorizing Provider  albuterol (PROVENTIL HFA;VENTOLIN HFA) 108 (90 Base) MCG/ACT inhaler Inhale 2 puffs into the lungs every 6 (six) hours as needed for wheezing or shortness of breath. 07/07/18  Yes  Lorre Munroe, NP  clarithromycin (BIAXIN) 500 MG tablet Take 500 mg by mouth 2 (two) times daily. 12/22/19  Yes [provider]  meclizine (ANTIVERT) 25 MG tablet Take 1 tablet (25 mg total) by mouth 3 (three) times daily as needed for dizziness. 11/21/19  Yes Tommi Rumps, PA-C  medroxyPROGESTERone (DEPO-PROVERA) 150 MG/ML injection Inject 150 mg into the muscle every 3 (three) months.   Yes [provider]  metFORMIN (GLUCOPHAGE) 1000 MG tablet TAKE 1 TABLET TWICE A DAY WITH MEALS Patient taking differently: Take 1,000 mg by mouth 2 (two) times daily with a meal.  02/27/19  Yes Carlus Pavlov, MD  metroNIDAZOLE (FLAGYL) 500 MG tablet Take 500 mg by mouth 2 (two) times daily. 12/22/19  Yes [provider]  nebivolol (BYSTOLIC) 10 MG  tablet Take 1 tablet (10 mg total) by mouth daily. 09/12/19  Yes Doreene Nest, NP  ondansetron (ZOFRAN-ODT) 8 MG disintegrating tablet PLACE 1 TABLET ON THE TONGUE TWICE A DAY AS NEEDED FOR NAUSEA OR VOMITING Patient taking differently: Take 8 mg by mouth 2 (two) times daily as needed for nausea or vomiting.  09/12/19  Yes Doreene Nest, NP  pantoprazole (PROTONIX) 40 MG tablet Take 40 mg by mouth 2 (two) times daily. 12/22/19  Yes [provider]  telmisartan (MICARDIS) 40 MG tablet Take 1 tablet (40 mg total) by mouth daily. For blood pressure. 03/13/19  Yes Doreene Nest, NP  traZODone (DESYREL) 100 MG tablet Take 1 tablet (100 mg total) by mouth at bedtime. For sleep. Patient taking differently: Take 50 mg by mouth at bedtime. For sleep. 11/16/19  Yes Doreene Nest, NP  TRULICITY 1.5 MG/0.5ML SOPN INJECT 1.5 MG INTO THE SKIN ONCE A WEEK FOR DIABETES Patient taking differently: Inject 1.5 mg into the skin every Friday. For diabetes. 09/12/19  Yes Doreene Nest, NP  blood glucose meter kit and supplies KIT Dispense based on patient and insurance preference. Use 1x a day. (FOR ICD-9 250.00, 250.01). 04/07/18   Carlus Pavlov, MD  glucose blood (FREESTYLE LITE) test strip Use as instructed, test blood sugar once a day 06/22/18   Carlus Pavlov, MD  Insulin Pen Needle 32G X 4 MM MISC Use 1x a day 04/07/18   Carlus Pavlov, MD  Lancets MISC Use 1x a day 04/07/18   Carlus Pavlov, MD  predniSONE (DELTASONE) 20 MG tablet Take 2 tablets (40 mg total) by mouth daily with breakfast. Patient not taking: Reported on 12/26/2019 03/20/19   Lorre Munroe, NP  triamcinolone (KENALOG) 0.1 % paste Use as directed 1 application in the mouth or throat 2 (two) times daily. Patient not taking: Reported on 12/26/2019 03/13/19   Doreene Nest, NP    Inpatient Medications: Scheduled Meds: . ondansetron  4 mg Intravenous Once   Continuous Infusions:  PRN  Meds:   Allergies:    Allergies  Allergen Reactions  . Amoxicillin Anaphylaxis and Other (See Comments)    Has patient had a PCN reaction causing immediate rash, facial/tongue/throat swelling, SOB or lightheadedness with hypotension: Yes Has patient had a PCN reaction causing severe rash involving mucus membranes or skin necrosis: No Has patient had a PCN reaction that required hospitalization No Has patient had a PCN reaction occurring within the last 10 years: No If all of the above answers are "NO", then may proceed with Cephalosporin use.  . Ciprofloxacin Anaphylaxis  . Other Anaphylaxis    Pt states that she can only take  Avelox, and Zithromax.    Marland Kitchen Penicillins Anaphylaxis and Other (See Comments)    Has patient had a PCN reaction causing immediate rash, facial/tongue/throat swelling, SOB or lightheadedness with hypotension: Yes Has patient had a PCN reaction causing severe rash involving mucus membranes or skin necrosis: No Has patient had a PCN reaction that required hospitalization No Has patient had a PCN reaction occurring within the last 10 years: No If all of the above answers are "NO", then may proceed with Cephalosporin use.  . Sulfa Antibiotics Anaphylaxis  . Erythromycin Other (See Comments)    GI upset with oral regimen over 10 years ago  . Vicodin [Hydrocodone-Acetaminophen] Itching    Social History:   Social History   Socioeconomic History  . Marital status: Married    Spouse name: Not on file  . Number of children: Not on file  . Years of education: Not on file  . Highest education level: Not on file  Occupational History  . Not on file  Tobacco Use  . Smoking status: Never Smoker  . Smokeless tobacco: Never Used  Vaping Use  . Vaping Use: Never used  Substance and Sexual Activity  . Alcohol use: Yes    Alcohol/week: 0.0 standard drinks  . Drug use: No  . Sexual activity: Not on file  Other Topics Concern  . Not on file  Social History Narrative    Married.   1 daughter.   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    Social Determinants of Health   Financial Resource Strain:   . Difficulty of Paying Living Expenses:   Food Insecurity:   . Worried About Charity fundraiser in the Last Year:   . Arboriculturist in the Last Year:   Transportation Needs:   . Film/video editor (Medical):   Marland Kitchen Lack of Transportation (Non-Medical):   Physical Activity:   . Days of Exercise per Week:   . Minutes of Exercise per Session:   Stress:   . Feeling of Stress :   Social Connections:   . Frequency of Communication with Friends and Family:   . Frequency of Social Gatherings with Friends and Family:   . Attends Religious Services:   . Active Member of Clubs or Organizations:   . Attends Archivist Meetings:   Marland Kitchen Marital Status:   Intimate Partner Violence:   . Fear of Current or Ex-Partner:   . Emotionally Abused:   Marland Kitchen Physically Abused:   . Sexually Abused:     Family History:    Family History  Adopted: Yes  Problem Relation Age of Onset  . Other Other        adopted     ROS:  Please see the history of present illness.  General:no colds or fevers, no weight changes Skin:no rashes or ulcers HEENT:no blurred vision, no congestion CV:see HPI PUL:see HPI SZ:JUDILOK diarrhea stable but under treatment for H pylori no constipation or melena, no indigestion GU:no hematuria, no dysuria MS:no joint pain, no claudication Neuro:+ near syncope, no lightheadedness Endo:+ diabetes, no thyroid disease  All other ROS reviewed and negative.     Physical Exam/Data:   Vitals:   12/26/19 0430 12/26/19 0607 12/26/19 0608 12/26/19 0834  BP: 100/71  (!) 101/45   Pulse: 79 76 79   Resp: _0 (!) 24  Temp:      TempSrc:      SpO2: 100% 99%  100%    No intake or output data in the 24 hours ending 12/26/19 0928 Last 3 Weights 11/21/2019 06/08/2019 03/22/2019  Weight (lbs) 217 lb 218 lb 4 oz 203 lb   Weight (kg) 98.431 kg 98.998 kg 92.08 kg     There is no height or weight on file to calculate BMI.  General:  Well nourished, well developed, in no acute distress HEENT: normal Lymph: no adenopathy Neck: no JVD Endocrine:  No thryomegaly Vascular: No carotid bruits; pedal pulses 2+ bilaterally  Cardiac:  normal S1, S2; RRR; no murmur gallup rub or click Lungs:  clear to auscultation bilaterally, no wheezing, rhonchi or rales  Abd: soft, nontender, no hepatomegaly  Ext: no edema Musculoskeletal:  No deformities, BUE and BLE strength normal and equal Skin: warm and dry  Neuro:  Alert and oriented X 3 MAE follows commands, no focal abnormalities noted Psych:  Normal affect     Relevant CV Studies: Mild MR in 2013 on echo  Laboratory Data:  High Sensitivity Troponin:   Recent Labs  Lab 12/26/19 0230  TROPONINIHS 3     Chemistry Recent Labs  Lab 12/26/19 0206  NA 136  K 3.9  CL 103  CO2 20*  GLUCOSE 272*  BUN 20  CREATININE 1.02*  CALCIUM 8.8*  GFRNONAA >60  GFRAA >60  ANIONGAP 13    Recent Labs  Lab 12/26/19 0206  PROT 6.9  ALBUMIN 3.6  AST 24  ALT 26  ALKPHOS 80  BILITOT 0.5   Hematology Recent Labs  Lab 12/26/19 0206  WBC 11.2*  RBC 4.51  HGB 13.4  HCT 41.7  MCV 92.5  MCH 29.7  MCHC 32.1  RDW 13.3  PLT 281   BNPNo results for input(s): BNP, PROBNP in the last 168 hours.  DDimer No results for input(s): DDIMER in the last 168 hours.   Radiology/Studies:  CT Head Wo Contrast  Result Date: 12/26/2019 CLINICAL DATA:  54 year old female with sudden onset near syncope. EXAM: CT HEAD WITHOUT CONTRAST TECHNIQUE: Contiguous axial images were obtained from the base of the skull through the vertex without intravenous contrast. COMPARISON:  Head CT dated 03/22/2019. FINDINGS: Brain: The ventricles and sulci appropriate size for patient's age. The gray-white matter discrimination is preserved. There is no acute intracranial hemorrhage. No mass effect  or midline shift. No extra-axial fluid collection. Vascular: No hyperdense vessel or unexpected calcification. Skull: Normal. Negative for fracture or focal lesion. Sinuses/Orbits: No acute finding. Other: None IMPRESSION: Unremarkable noncontrast CT of the brain. Electronically Signed   By: Anner Crete M.D.   On: 12/26/2019 03:45   CT Angio Chest/Abd/Pel for Dissection W and/or Wo Contrast  Result Date: 12/26/2019 CLINICAL DATA:  54 year old female with sudden onset near syncope and hypotension. Concern for aortic dissection. EXAM: CT ANGIOGRAPHY CHEST, ABDOMEN AND PELVIS TECHNIQUE: Non-contrast CT of the chest was initially obtained. Multidetector CT imaging through the chest, abdomen and pelvis was performed using the standard protocol during bolus administration of intravenous contrast. Multiplanar reconstructed images and MIPs were obtained and reviewed to evaluate the vascular anatomy. CONTRAST:  127m OMNIPAQUE IOHEXOL 350 MG/ML SOLN COMPARISON:  Chest CT dated 10/31/2017 and CT abdomen pelvis dated 10/27/2013. FINDINGS: Evaluation is limited due to streak artifact caused by patient's arms. CTA CHEST FINDINGS Cardiovascular: There is no cardiomegaly or pericardial effusion. The thoracic aorta is unremarkable. The right vertebral artery is not visualized. The remainder of the visualized origins of the great vessels of the aortic arch appear patent.  The central pulmonary arteries appear patent as visualized. Mediastinum/Nodes: There is no hilar or mediastinal adenopathy. The esophagus is grossly unremarkable. No mediastinal fluid collection. Lungs/Pleura: The lungs are clear. There is no pleural effusion or pneumothorax. The central airways are patent. Musculoskeletal: Mild degenerative changes of the spine. No acute osseous pathology. Review of the MIP images confirms the above findings. CTA ABDOMEN AND PELVIS FINDINGS VASCULAR Aorta: Normal caliber aorta without aneurysm, dissection, vasculitis or  significant stenosis. Celiac: There is mild focal narrowing of the origin of the celiac artery. The celiac axis and its major branches remain patent. SMA: Patent without evidence of aneurysm, dissection, vasculitis or significant stenosis. Renals: Both renal arteries are patent without evidence of aneurysm, dissection, vasculitis, fibromuscular dysplasia or significant stenosis. IMA: Patent without evidence of aneurysm, dissection, vasculitis or significant stenosis. Inflow: Patent without evidence of aneurysm, dissection, vasculitis or significant stenosis. Veins: No obvious venous abnormality within the limitations of this arterial phase study. Review of the MIP images confirms the above findings. NON-VASCULAR No intra-abdominal free air or free fluid. Hepatobiliary: Probable fatty infiltration of the liver. No intrahepatic biliary ductal dilatation. Cholecystectomy. Pancreas: Unremarkable. No pancreatic ductal dilatation or surrounding inflammatory changes. Spleen: Normal in size without focal abnormality. Adrenals/Urinary Tract: The adrenal glands are unremarkable. There is mild bilateral renal parenchyma atrophy. There is no hydronephrosis on either side. The visualized ureters and urinary bladder appear unremarkable. Stomach/Bowel: There is loose stool within the colon compatible with diarrheal state. Correlation with clinical exam and stool cultures recommended. There is no bowel obstruction. No active inflammatory changes. The appendix is normal as visualized. Lymphatic: No adenopathy. Reproductive: The uterus and ovaries are grossly unremarkable. No pelvic mass. Other: None Musculoskeletal: No acute or significant osseous findings. Review of the MIP images confirms the above findings. IMPRESSION: 1. No aortic aneurysm or dissection. 2. Diarrheal state. Correlation with clinical exam and stool cultures recommended. No bowel obstruction. Normal appendix. Electronically Signed   By: Anner Crete M.D.   On:  12/26/2019 03:54       HEAR Score (for undifferentiated chest pain):     4     Assessment and Plan:   1. Chest pressure with hypotension.  Now resolved and BP improved.  Her BP usually runs in 700-174 systolic.  Pt with DM for 24 years, obese, and hx of tachycardia and she is adopted.  prior stress echo with no ischemia.  May be candidate for cardiac CTA to eval CAD with her risk factors this would help direct improved treatment.  She prefers no statins.   May be able to discharge and outpt cardiac CTA.  2. Hypotension with near syncope, may have been vasovagal.  But 30 day event monitor may be beneficial to eval for arrhythmias.  Will defer to Dr. Audie Box.   3. Inappropriate tachycardia and rest HR in past 944, on bystolic for this. 4. DM-2 for 24 years    5. H pylori on several meds.        For questions or updates, please contact [facility] Please consult [email or web address] for contact info under    Signed, Cecilie Kicks, NP  12/26/2019 9:28 AM

## 2019-12-26 NOTE — ED Triage Notes (Addendum)
Patient reports hypotension BP=60/39 while at work this morning with dizziness/near syncope , diarrhea x1 and generalized weakness , CBG= 220, respirations unlabored , alert and oriented at triage. Currently taking oral antibiotics for H.Pylori.

## 2019-12-26 NOTE — ED Notes (Signed)
Lunch Tray Ordered @ 1040. 

## 2020-01-01 ENCOUNTER — Telehealth: Payer: Self-pay | Admitting: Cardiology

## 2020-01-01 NOTE — Telephone Encounter (Signed)
New Message   Called patient to schedule 6 week follow up visit for her with Dr. Flora Lipps. Please schedule 6 weeks from 12/29/19 when patient calls back.

## 2020-01-05 ENCOUNTER — Telehealth (HOSPITAL_COMMUNITY): Payer: Self-pay | Admitting: *Deleted

## 2020-01-05 NOTE — Telephone Encounter (Signed)
Attempted to call patient regarding upcoming cardiac CT appointment. Left message on voicemail with name and callback number  Kohlton Gilpatrick Tai RN Navigator Cardiac Imaging [facility] [phone] Office [phone] Cell  

## 2020-01-08 ENCOUNTER — Other Ambulatory Visit: Payer: Self-pay

## 2020-01-08 ENCOUNTER — Ambulatory Visit: Admitting: Primary Care

## 2020-01-08 ENCOUNTER — Ambulatory Visit (HOSPITAL_COMMUNITY)
Admission: RE | Admit: 2020-01-08 | Discharge: 2020-01-08 | Disposition: A | Discharge status: Home / Self Care | Source: Ambulatory Visit | Attending: Cardiology | Admitting: Cardiology

## 2020-01-08 ENCOUNTER — Encounter (HOSPITAL_COMMUNITY): Payer: Self-pay

## 2020-01-08 DIAGNOSIS — R079 Chest pain, unspecified: Secondary | ICD-10-CM | POA: Diagnosis present

## 2020-01-08 MED ORDER — DILTIAZEM HCL 25 MG/5ML IV SOLN
INTRAVENOUS | Status: AC
  Filled 2020-01-08: qty 5

## 2020-01-08 MED ORDER — METOPROLOL TARTRATE 5 MG/5ML IV SOLN
INTRAVENOUS | Status: AC
  Filled 2020-01-08: qty 10

## 2020-01-08 MED ORDER — METOPROLOL TARTRATE 5 MG/5ML IV SOLN
INTRAVENOUS | Status: AC
  Filled 2020-01-08: qty 5

## 2020-01-08 MED ORDER — METOPROLOL TARTRATE 5 MG/5ML IV SOLN
5.0000 mg | INTRAVENOUS | Status: DC | PRN
  Administered 2020-01-08 (×3): 5 mg via INTRAVENOUS

## 2020-01-08 MED ORDER — DILTIAZEM HCL 25 MG/5ML IV SOLN
5.0000 mg | INTRAVENOUS | Status: DC | PRN
  Administered 2020-01-08: 5 mg via INTRAVENOUS
  Filled 2020-01-08: qty 5

## 2020-01-08 MED ORDER — NITROGLYCERIN 0.4 MG SL SUBL: 0.8000 mg | SUBLINGUAL_TABLET | Freq: Once | SUBLINGUAL | Status: DC

## 2020-01-08 NOTE — Progress Notes (Signed)
Patient seen today for CT coronary. When asked about medication taken prior to scan, patient advises that she did not take the 100mg  of metoprolol 2 hours prior. She took her bystolic which she takes regularly. Once hooked up to cardiac monitor, HR in the 90s sinus rhythm and BP in the 110s systolic. Patient was given 15 mg of lopressor and 5 mg of cardizem. No change in HR and BP was trending downwards. Paged Dr. who stated to have patient reschedule. When asked why patient did not taken medication prior she states " she was not made aware that she needed to take the medication". Patient does work nights and said she may have just missed the voicemail. This RN will reach out to [facility] CT heart navigator to reschedule. IV discontinued and patient ambulatory at discharge.

## 2020-01-09 ENCOUNTER — Encounter (HOSPITAL_COMMUNITY): Payer: Self-pay | Admitting: *Deleted

## 2020-01-09 ENCOUNTER — Telehealth (HOSPITAL_COMMUNITY): Payer: Self-pay | Admitting: *Deleted

## 2020-01-09 NOTE — Telephone Encounter (Signed)
Patient given detailed instructions per Myocardial Perfusion Study Information Sheet for the test on 01/15/2020 at 0800. Patient notified to arrive 15 minutes early and that it is imperative to arrive on time for appointment to keep from having the test rescheduled.  If you need to cancel or reschedule your appointment, please call the office within 24 hours of your appointment. . Patient verbalized understanding.Melissa [name], Adelene Idler Mychart letter sent with instructions

## 2020-01-15 ENCOUNTER — Ambulatory Visit (HOSPITAL_COMMUNITY): Attending: Cardiology

## 2020-01-15 ENCOUNTER — Other Ambulatory Visit: Payer: Self-pay

## 2020-01-15 DIAGNOSIS — R079 Chest pain, unspecified: Secondary | ICD-10-CM | POA: Insufficient documentation

## 2020-01-15 LAB — MYOCARDIAL PERFUSION IMAGING
LV dias vol: 50 mL (ref 46–106)
LV sys vol: 19 mL
Peak HR: 118 {beats}/min
Rest HR: 83 {beats}/min
SDS: 1
SRS: 0
SSS: 1
TID: 1.11

## 2020-01-15 MED ORDER — REGADENOSON 0.4 MG/5ML IV SOLN
0.4000 mg | Freq: Once | INTRAVENOUS | Status: AC
  Administered 2020-01-15: 0.4 mg via INTRAVENOUS

## 2020-01-15 MED ORDER — TECHNETIUM TC 99M TETROFOSMIN IV KIT
10.3000 | PACK | Freq: Once | INTRAVENOUS | Status: AC | PRN
  Administered 2020-01-15: 10.3 via INTRAVENOUS
  Filled 2020-01-15: qty 11

## 2020-01-15 MED ORDER — TECHNETIUM TC 99M TETROFOSMIN IV KIT
31.8000 | PACK | Freq: Once | INTRAVENOUS | Status: AC | PRN
  Administered 2020-01-15: 31.8 via INTRAVENOUS
  Filled 2020-01-15: qty 32

## 2020-01-16 ENCOUNTER — Encounter: Payer: Self-pay | Admitting: Primary Care

## 2020-01-16 ENCOUNTER — Ambulatory Visit (INDEPENDENT_AMBULATORY_CARE_PROVIDER_SITE_OTHER): Admitting: Primary Care

## 2020-01-16 VITALS — BP 128/82 | HR 88 | Temp 96.2°F | Ht 60.0 in | Wt 228.5 lb

## 2020-01-16 DIAGNOSIS — E1165 Type 2 diabetes mellitus with hyperglycemia: Secondary | ICD-10-CM | POA: Diagnosis not present

## 2020-01-16 DIAGNOSIS — R55 Syncope and collapse: Secondary | ICD-10-CM | POA: Diagnosis not present

## 2020-01-16 DIAGNOSIS — R000 Tachycardia, unspecified: Secondary | ICD-10-CM | POA: Diagnosis not present

## 2020-01-16 DIAGNOSIS — R42 Dizziness and giddiness: Secondary | ICD-10-CM

## 2020-01-16 DIAGNOSIS — Z87892 Personal history of anaphylaxis: Secondary | ICD-10-CM

## 2020-01-16 DIAGNOSIS — E785 Hyperlipidemia, unspecified: Secondary | ICD-10-CM

## 2020-01-16 DIAGNOSIS — G4700 Insomnia, unspecified: Secondary | ICD-10-CM | POA: Diagnosis not present

## 2020-01-16 LAB — POCT GLYCOSYLATED HEMOGLOBIN (HGB A1C): Hemoglobin A1C: 7.6 % — AB (ref 4.0–5.6)

## 2020-01-16 NOTE — Assessment & Plan Note (Signed)
Doing well on Trazodone 50 mg HS, continue same.  

## 2020-01-16 NOTE — Progress Notes (Signed)
Subjective:    Patient ID: Melissa [name], female    DOB: June 09, 1966, 54 y.o.   MRN: [id number]  HPI  This visit occurred during the SARS-CoV-2 public health emergency.  Safety protocols were in place, including screening questions prior to the visit, additional usage of staff PPE, and extensive cleaning of exam room while observing appropriate contact time as indicated for disinfecting solutions.   Melissa [name] is a 54 year old female who presents today for follow up of chronic conditions. She also has several questions regarding the mandatory Covid-19 vaccine requirement.   1) Type 2 Diabetes:   Current medications include: Trulicity 1.5 mg weekly, Metformin 1000 mg BID.  Last A1C: 7.5 in December 2020, 7.6 today Last Eye Exam: UTD Last Foot Exam: Due Pneumonia Vaccination: Completed in 2018 ACE/ARB: Telmisartan Statin: None.   The 10-year ASCVD risk score Melissa [name]., et al., 2013) is: 4.5%   Values used to calculate the score:     Age: 41 years     Sex: Female     Is Non-Hispanic African American: No     Diabetic: Yes     Tobacco smoker: No     Systolic Blood Pressure: 132 mmHg     Is BP treated: Yes     HDL Cholesterol: 43.6 mg/dL     Total Cholesterol: 196 mg/dL   2) H Pylori Infection: Following with weight management and is under evaluation for weight loss surgery. 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 She underwent treatment with antibiotics and PPI, completed and is due for repeat testing soon.   3) Tachycardia/Vasovagal Syncope: Evaluated at [facility] on 12/26/19 for near syncopal event while at work upstairs. Work up grossly negative including labs, CT head, CTA of chest, CTA abdomen/pelvis. She was not admitted. Cardiology did consult in the ED who recommended outpatient testing.  Currently managed on Bystolic 10 mg. Following with cardiology and underwent myocardial perfusion test yesterday, which was unremarkable. She is due for follow up soon,  will get Lipid testing and treatment for hyperlipidemia. She is not interested in statin therapy or Zetia.   4) Insomnia: Previously managed on Ambien, now currently managed on Trazodone 100 mg HS but is taking 50 mg nightly. Overall dong well.   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 She took the H1N1 vaccine in 2008 and had symptoms of anaphylaxis including throat tightness, wheezing, shortness of breath. She was nearly intubated. Was sent down to the ED for evaluation and treatment.  Since her reaction to H1N1 in 2003/2004 she's continued to undergo influenza vaccinations without problems, was getting these prior to her H1N1 vaccine. She is requesting exemption from the Covid-19 vaccine requirement but is willing to undergo the Melissa [name] and Melissa [name] Covid-19 vaccine but only under Emergency Department supervision. She has a form today to excuse her from Covid-19 vaccination.   BP Readings from Last 3 Encounters:  01/16/20 128/82  01/08/20 116/74  12/26/19 (!) 111/56     Review of Systems  Eyes: Negative for visual disturbance.  Respiratory: Negative for shortness of breath.   Cardiovascular: Negative for chest pain.  Neurological: Negative for dizziness and numbness.       Past Medical History:  Diagnosis Date  . Asthma   . Complication of anesthesia   . Diabetes mellitus  without complication (HCC)    Type II  . Dysrhythmia    Tachycardia  . GERD (gastroesophageal reflux disease)   . Pneumonia    2014- 'Walking"  . Shortness of breath dyspnea    with exertion  . Vertigo      Social History   Socioeconomic History  . Marital status: Married    Spouse name: Not on file  . Number of children: Not on file  . Years of education: Not on file  . Highest education level: Not on file  Occupational History    . Not on file  Tobacco Use  . Smoking status: Never Smoker  . Smokeless tobacco: Never Used  Vaping Use  . Vaping Use: Never used  Substance and Sexual Activity  . Alcohol use: Yes    Alcohol/week: 0.0 standard drinks  . Drug use: No  . Sexual activity: Not on file  Other Topics Concern  . Not on file  Social History Narrative   Married.   1 daughter.   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    Social Determinants of Health   Financial Resource Strain:   . Difficulty of Paying Living Expenses:   Food Insecurity:   . Worried About Charity fundraiser in the Last Year:   . Arboriculturist in the Last Year:   Transportation Needs:   . Film/video editor (Medical):   Marland Kitchen Lack of Transportation (Non-Medical):   Physical Activity:   . Days of Exercise per Week:   . Minutes of Exercise per Session:   Stress:   . Feeling of Stress :   Social Connections:   . Frequency of Communication with Friends and Family:   . Frequency of Social Gatherings with Friends and Family:   . Attends Religious Services:   . Active Member of Clubs or Organizations:   . Attends Archivist Meetings:   Marland Kitchen Marital Status:   Intimate Partner Violence:   . Fear of Current or Ex-Partner:   . Emotionally Abused:   Marland Kitchen Physically Abused:   . Sexually Abused:     Past Surgical History:  Procedure Laterality Date  . CHOLECYSTECTOMY  1997  . COLONOSCOPY  2015  . CYSTOSCOPY     age 62- stretched stem of bladder  . 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 SHOULDER ARTHROSCOPY Left 11/20/2014   Procedure: ARTHROSCOPY SHOULDER WITH MUA, ROTATOR INTERVAL RELEASE;  Surgeon: Melissa Pel, MD;  Location: [facility];  Service: Orthopedics;  Laterality: Left;  LEFT SHOULDER MUA, DOA, ROTATOR INTERVAL RELEASE.  . TONSILLECTOMY  2010    Family History  Adopted: Yes  Problem Relation Age of Onset  . Other Other        adopted    Allergies  Allergen Reactions  .  Amoxicillin Anaphylaxis and Other (See Comments)    Has patient had a PCN reaction causing immediate rash, facial/tongue/throat swelling, SOB or lightheadedness with hypotension: Yes Has patient had a PCN reaction causing severe rash involving mucus membranes or skin necrosis: No Has patient had a PCN reaction that required hospitalization No Has patient had a PCN reaction occurring within the last 10 years: No If all of the above answers are "NO", then may proceed with Cephalosporin use.  . Ciprofloxacin Anaphylaxis  . Other Anaphylaxis    Pt states that she can only take Avelox, and Zithromax.    Marland Kitchen Penicillins Anaphylaxis and Other (  See Comments)    Has patient had a PCN reaction causing immediate rash, facial/tongue/throat swelling, SOB or lightheadedness with hypotension: Yes Has patient had a PCN reaction causing severe rash involving mucus membranes or skin necrosis: No Has patient had a PCN reaction that required hospitalization No Has patient had a PCN reaction occurring within the last 10 years: No If all of the above answers are "NO", then may proceed with Cephalosporin use.  . Sulfa Antibiotics Anaphylaxis  . Erythromycin Other (See Comments)    GI upset with oral regimen over 10 years ago  . Vicodin [Hydrocodone-Acetaminophen] Itching    Current Outpatient Medications on File Prior to Visit  Medication Sig Dispense Refill  . albuterol (PROVENTIL HFA;VENTOLIN HFA) 108 (90 Base) MCG/ACT inhaler Inhale 2 puffs into the lungs every 6 (six) hours as needed for wheezing or shortness of breath. 1 Inhaler 2  . blood glucose meter kit and supplies KIT Dispense based on patient and insurance preference. Use 1x a day. (FOR ICD-9 250.00, 250.01). 1 each 0  . glucose blood (FREESTYLE LITE) test strip Use as instructed, test blood sugar once a day 100 each 12  . Insulin Pen Needle 32G X 4 MM MISC Use 1x a day 100 each 3  . Lancets MISC Use 1x a day 100 each 3  . meclizine (ANTIVERT) 25 MG  tablet Take 1 tablet (25 mg total) by mouth 3 (three) times daily as needed for dizziness. 30 tablet 0  . medroxyPROGESTERone (DEPO-PROVERA) 150 MG/ML injection Inject 150 mg into the muscle every 3 (three) months.    . metFORMIN (GLUCOPHAGE) 1000 MG tablet TAKE 1 TABLET TWICE A DAY WITH MEALS (Patient taking differently: Take 1,000 mg by mouth 2 (two) times daily with a meal. ) 180 tablet 3  . nebivolol (BYSTOLIC) 10 MG tablet Take 1 tablet (10 mg total) by mouth daily. 90 tablet 3  . ondansetron (ZOFRAN-ODT) 8 MG disintegrating tablet PLACE 1 TABLET ON THE TONGUE TWICE A DAY AS NEEDED FOR NAUSEA OR VOMITING (Patient taking differently: Take 8 mg by mouth 2 (two) times daily as needed for nausea or vomiting. ) 20 tablet 0  . pantoprazole (PROTONIX) 40 MG tablet Take 40 mg by mouth 2 (two) times daily.    Marland Kitchen telmisartan (MICARDIS) 40 MG tablet Take 1 tablet (40 mg total) by mouth daily. For blood pressure. 90 tablet 3  . traZODone (DESYREL) 100 MG tablet Take 1 tablet (100 mg total) by mouth at bedtime. For sleep. (Patient taking differently: Take 50 mg by mouth at bedtime. For sleep.) 90 tablet 0  . triamcinolone (KENALOG) 0.1 % paste Use as directed 1 application in the mouth or throat 2 (two) times daily. 5 g 0  . TRULICITY 1.5 NT/7.0YF SOPN INJECT 1.5 MG INTO THE SKIN ONCE A WEEK FOR DIABETES (Patient taking differently: Inject 1.5 mg into the skin every Friday. For diabetes.) 6 mL 1   No current facility-administered medications on file prior to visit.    BP 128/82   Pulse 88   Temp (!) 96.2 F (35.7 C) (Temporal)   Ht 5' (1.524 m)   Wt 228 lb 8 oz (103.6 kg)   SpO2 98%   BMI 44.63 kg/m    Objective:   Physical Exam Cardiovascular:     Rate and Rhythm: Normal rate and regular rhythm.  Pulmonary:     Effort: Pulmonary effort is normal.     Breath sounds: Normal breath sounds.  Musculoskeletal:  Cervical back: Neck supple.  Skin:    General: Skin is warm and dry.   Psychiatric:        Mood and Affect: Mood normal.            Assessment & Plan:

## 2020-01-16 NOTE — Assessment & Plan Note (Signed)
Doing well on Bystolic. Continue same.

## 2020-01-16 NOTE — Assessment & Plan Note (Signed)
History of severe anaphylaxis to unknown source in 2003/2004, and again to H1N1 vaccine in 2008. Was told in the past to avoid new vaccines.   She is requesting to be exempt from Covid-19 vaccine given her history, she has forms with her today.   She would be willing to take the Janssen vaccine but only under emergency department supervision.  Will review and further investigate.

## 2020-01-16 NOTE — Patient Instructions (Signed)
Start exercising. You should be getting 150 minutes of moderate intensity exercise weekly.  It is important that you improve your diet. Please limit carbohydrates in the form of white bread, rice, pasta, sweets, fast food, fried food, sugary drinks, etc. Increase your consumption of fresh fruits and vegetables, whole grains, lean protein.  Ensure you are consuming 64 ounces of water daily.  Please schedule a follow up appointment in 6 months for diabetes check.   It was a pleasure to see you today!

## 2020-01-16 NOTE — Assessment & Plan Note (Signed)
LDL above goal in December 2020. I recommended repeat lipid testing today, she kindly declines as she will have this done soon at her cardiologist's office.  She has kindly refused statin therapy and Zetia in the past. She will speak with cardiology regarding other options.

## 2020-01-16 NOTE — Assessment & Plan Note (Signed)
Recent evaluation in ED for near syncopal event. All imaging and labs reviewed.  Evaluated by cardiology, underwent stress test which was negative.  Continue to work on hydration. Continue to monitor.

## 2020-01-16 NOTE — Assessment & Plan Note (Signed)
Continued and intermittent. Continue PRN meclizine.

## 2020-01-16 NOTE — Assessment & Plan Note (Signed)
A1C of 7.6 today which is about the same as last year.  Advised to work on diet, exercise daily. Continue Metformin and Trulicity for now.   Foot exam today. Eye exam UTD. Pneumonia vaccination UTD.  Follow up in 6 months.

## 2020-01-19 NOTE — Telephone Encounter (Signed)
Patient called to schedule 6 week follow up appointment with Dr. Flora Lipps. However, Dr. Marylene Buerger soonest appointment available is on 02/26/20. Patient states she is beginning her new job on 02/26/20 and she would like to be worked in to Dr. Marylene Buerger schedule for a sooner appointment. Please assist.

## 2020-01-19 NOTE — Telephone Encounter (Signed)
Called pt and scheduled appointment for 8/13. Ok to schedule per Raynelle Fanning.

## 2020-01-23 NOTE — Progress Notes (Signed)
Cardiology Office Note:   Date:  01/26/2020  NAME:  Melissa [name]    MRN: [id number] DOB:  12/05/1965   PCP:  Pleas Koch, NP  Cardiologist:  Evalina Field, MD  Electrophysiologist:  None   Referring MD: Pleas Koch, NP   Chief Complaint  Patient presents with  . Headache  . Edema    bilateral ankles   History of Present Illness:   Melissa [name] is a 54 y.o. female with a hx of DM, HLD who presents for follow-up. Evaluated in the ER 7/13 for pre-syncope. We attempted outpatient CCTA but HR too elevated. NM stress test normal. We need to get her on a statin.  She reports has been doing well since her last visit.  I saw her in the emergency room after a syncopal event.  She cannot have a cardiac CTA as above.  Nuclear medicine stress test is normal.  She had no further episodes of syncope.  She reports no chest pain or shortness of breath with exertion.  She reports some trace lower extremity edema in the left ankle.  She has none today.  I suspect this is related to musculoskeletal problem.  She has no evidence of heart failure on exam.  Overall appears to be doing well.  Her most recent A1c is 7.6.  We did discuss that her LDL cholesterol is 125 and she should be on a statin.  She reports that she is just not willing to take a statin medication.  We discussed Zetia.  She is interested.  Her blood pressure is 100/80.  She takes Journalist, newspaper.  Apparently this is well controlled.  She does not exercise routinely.  She also is obese with a BMI of 43.  She reports she is going to start working on exercise.  However, with her current level of activity she denies any chest pain or shortness of breath.  She overall appears to be doing quite well.  Her main issue is weight getting her lipids under better control.  Problem List 1. Diabetes -A1c 7.6 -T chol 196, HDL 43, LDL 125, TG 221   Past Medical History: Past Medical History:  Diagnosis Date  . Asthma   . Complication of anesthesia     . Diabetes mellitus without complication ([place])    Type II  . Dysrhythmia    Tachycardia  . GERD (gastroesophageal reflux disease)   . Pneumonia    2014- 'Walking"  . Shortness of breath dyspnea    with exertion  . Vertigo     Past Surgical History: Past Surgical History:  Procedure Laterality Date  . CHOLECYSTECTOMY  1997  . COLONOSCOPY  2015  . CYSTOSCOPY     age 89- stretched stem of bladder  . 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 SHOULDER ARTHROSCOPY Left 11/20/2014   Procedure: ARTHROSCOPY SHOULDER WITH MUA, ROTATOR INTERVAL RELEASE;  Surgeon: Meredith Pel, MD;  Location: [facility];  Service: Orthopedics;  Laterality: Left;  LEFT SHOULDER MUA, DOA, ROTATOR INTERVAL RELEASE.  . TONSILLECTOMY  2010    Current Medications: Current Meds  Medication Sig  . albuterol (PROVENTIL HFA;VENTOLIN HFA) 108 (90 Base) MCG/ACT inhaler Inhale 2 puffs into the lungs every 6 (six) hours as needed for wheezing or shortness of breath.  . blood glucose meter kit and supplies KIT Dispense based on patient and insurance preference. Use 1x a day. (FOR ICD-9 250.00, 250.01).  Marland Kitchen glucose blood (FREESTYLE LITE) test strip  Use as instructed, test blood sugar once a day  . Insulin Pen Needle 32G X 4 MM MISC Use 1x a day  . Lancets MISC Use 1x a day  . meclizine (ANTIVERT) 25 MG tablet Take 1 tablet (25 mg total) by mouth 3 (three) times daily as needed for dizziness.  . medroxyPROGESTERone (DEPO-PROVERA) 150 MG/ML injection Inject 150 mg into the muscle every 3 (three) months.  . metFORMIN (GLUCOPHAGE) 1000 MG tablet TAKE 1 TABLET TWICE A DAY WITH MEALS (Patient taking differently: Take 1,000 mg by mouth 2 (two) times daily with a meal. )  . nebivolol (BYSTOLIC) 10 MG tablet Take 1 tablet (10 mg total) by mouth daily.  . ondansetron (ZOFRAN-ODT) 8 MG disintegrating tablet PLACE 1 TABLET ON THE TONGUE TWICE A DAY AS NEEDED FOR NAUSEA OR VOMITING (Patient taking differently: Take 8 mg by mouth 2 (two) times  daily as needed for nausea or vomiting. )  . telmisartan (MICARDIS) 40 MG tablet Take 1 tablet (40 mg total) by mouth daily. For blood pressure.  . traZODone (DESYREL) 100 MG tablet Take 1 tablet (100 mg total) by mouth at bedtime. For sleep. (Patient taking differently: Take 50 mg by mouth at bedtime. For sleep.)  . TRULICITY 1.5 OA/4.1YS SOPN INJECT 1.5 MG INTO THE SKIN ONCE A WEEK FOR DIABETES (Patient taking differently: Inject 1.5 mg into the skin every Friday. For diabetes.)  . [DISCONTINUED] triamcinolone (KENALOG) 0.1 % paste Use as directed 1 application in the mouth or throat 2 (two) times daily.     Allergies:    Amoxicillin, Ciprofloxacin, Other, Penicillins, Sulfa antibiotics, Erythromycin, and Vicodin [hydrocodone-acetaminophen]   Social History: Social History   Socioeconomic History  . Marital status: Married    Spouse name: Not on file  . Number of children: Not on file  . Years of education: Not on file  . Highest education level: Not on file  Occupational History  . Not on file  Tobacco Use  . Smoking status: Never Smoker  . Smokeless tobacco: Never Used  Vaping Use  . Vaping Use: Never used  Substance and Sexual Activity  . Alcohol use: Yes    Alcohol/week: 0.0 standard drinks  . Drug use: No  . Sexual activity: Not on file  Other Topics Concern  . Not on file  Social History Narrative   Married.   1 daughter.   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    Social Determinants of Health   Financial Resource Strain:   . Difficulty of Paying Living Expenses:   Food Insecurity:   . Worried About Charity fundraiser in the Last Year:   . Arboriculturist in the Last Year:   Transportation Needs:   . Film/video editor (Medical):   Marland Kitchen Lack of Transportation (Non-Medical):   Physical Activity:   . Days of Exercise per Week:   . Minutes of Exercise per Session:   Stress:   . Feeling of Stress :   Social Connections:   .  Frequency of Communication with Friends and Family:   . Frequency of Social Gatherings with Friends and Family:   . Attends Religious Services:   . Active Member of Clubs or Organizations:   . Attends Archivist Meetings:   Marland Kitchen Marital Status:      Family History: The patient's family history includes Other in an other family member. She was adopted.  ROS:   All  other ROS reviewed and negative. Pertinent positives noted in the HPI.     EKGs/Labs/Other Studies Reviewed:   The following studies were personally reviewed by me today:  Recent Labs: 12/26/2019: ALT 26; BUN 20; Creatinine, Ser 1.02; Hemoglobin 13.4; Platelets 281; Potassium 3.9; Sodium 136   NM Stress 01/15/2020  Nuclear stress EF: 63%. Normal wall motion.  There was no ST segment deviation noted during stress.  The study is normal.  This is a low risk study. No ischemia or infarct identified.   Recent Lipid Panel    Component Value Date/Time   CHOL 196 06/08/2019 0908   TRIG 221.0 (H) 06/08/2019 0908   HDL 43.60 06/08/2019 0908   CHOLHDL 4 06/08/2019 0908   VLDL 44.2 (H) 06/08/2019 0908   LDLCALC 122 (H) 05/12/2017 0931   LDLDIRECT 125.0 06/08/2019 0908    Physical Exam:   VS:  BP 100/80 (BP Location: Left Arm, Patient Position: Sitting, Cuff Size: Large)   Pulse 78   Ht 5' (1.524 m)   Wt 223 lb 12.8 oz (101.5 kg)   SpO2 98%   BMI 43.71 kg/m    Wt Readings from Last 3 Encounters:  01/26/20 223 lb 12.8 oz (101.5 kg)  01/16/20 228 lb 8 oz (103.6 kg)  01/15/20 (!) 217 lb (98.4 kg)    General: Well nourished, well developed, in no acute distress Heart: Atraumatic, normal size  Eyes: PEERLA, EOMI  Neck: Supple, no JVD Endocrine: No thryomegaly Cardiac: Normal S1, S2; RRR; no murmurs, rubs, or gallops Lungs: Clear to auscultation bilaterally, no wheezing, rhonchi or rales  Abd: Soft, nontender, no hepatomegaly  Ext: No edema, pulses 2+ Musculoskeletal: No deformities, BUE and BLE strength  normal and equal Skin: Warm and dry, no rashes   Neuro: Alert and oriented to person, place, time, and situation, CNII-XII grossly intact, no focal deficits  Psych: Normal mood and affect   ASSESSMENT:   Melissa [name] is a 54 y.o. female who presents for the following: 1. Near syncope   2. Mixed hyperlipidemia     PLAN:   1. Near syncope -She was seen in consultation in the hospital for near syncopal event.  Cardiac enzymes were negative.  EKG showed normal sinus rhythm.  Heart rate was too high for cardiac CTA.  Recent nuclear medicine stress test normal.  No further episodes reported.  Suspect this was just dehydration.  She also has longstanding diabetes and could have some neuropathic issues that explain her symptoms.  She seems to be doing well.  I encourage exercise and activity.  No murmurs on exam.  EKG is normal.  No further work-up needed.  2. Mixed hyperlipidemia -LDL not at goal.  Given her diabetes her LDL should be less than 70.  Her triglycerides should also be less than 150.  She is not interested in a statin.  She is concerned about the potential association between statins and ALS.  We will start with Zetia 10 mg daily.  I will plan to see her back in 3 months for recheck lipid profile 1 week before.  We will then get a good look at her triglycerides.  Given her diabetes we may need to address this next.  For now we will start with Zetia.   Disposition: Return in about 3 months (around 04/27/2020).  Medication Adjustments/Labs and Tests Ordered: Current medicines are reviewed at length with the patient today.  Concerns regarding medicines are outlined above.  No orders of the defined types  were placed in this encounter.  Meds ordered this encounter  Medications  . ezetimibe (ZETIA) 10 MG tablet    Sig: Take 1 tablet (10 mg total) by mouth daily.    Dispense:  30 tablet    Refill:  2    Patient Instructions  Medication Instructions:  Start Zetia 10 mg daily   *If  you need a refill on your cardiac medications before your next appointment, please call your pharmacy*  Follow-Up: At [facility], you and your health needs are our priority.  As part of our continuing mission to provide you with exceptional heart care, we have created designated Provider Care Teams.  These Care Teams include your primary Cardiologist (physician) and Advanced Practice Providers (APPs -  Physician Assistants and Nurse Practitioners) who all work together to provide you with the care you need, when you need it.  We recommend signing up for the patient portal called "MyChart".  Sign up information is provided on this After Visit Summary.  MyChart is used to connect with patients for Virtual Visits (Telemedicine).  Patients are able to view lab/test results, encounter notes, upcoming appointments, etc.  Non-urgent messages can be sent to your provider as well.   To learn more about what you can do with MyChart, go to [email or web address].    Your next appointment:   3 month(s)  The format for your next appointment:   In Person  Provider:   Eleonore Chiquito, MD         Time Spent with Patient: I have spent a total of 35 minutes with patient reviewing hospital notes, telemetry, EKGs, labs and examining the patient as well as establishing an assessment and plan that was discussed with the patient.  > 50% of time was spent in direct patient care.  Signed, Addison Naegeli. Audie Box, [place]  [address] [place], [place] [address] [phone]  01/26/2020 12:26 PM

## 2020-01-26 ENCOUNTER — Ambulatory Visit (INDEPENDENT_AMBULATORY_CARE_PROVIDER_SITE_OTHER): Admitting: Cardiovascular Disease

## 2020-01-26 ENCOUNTER — Encounter: Payer: Self-pay | Admitting: Cardiovascular Disease

## 2020-01-26 ENCOUNTER — Other Ambulatory Visit: Payer: Self-pay

## 2020-01-26 VITALS — BP 100/80 | HR 78 | Ht 60.0 in | Wt 223.8 lb

## 2020-01-26 DIAGNOSIS — E782 Mixed hyperlipidemia: Secondary | ICD-10-CM | POA: Diagnosis not present

## 2020-01-26 DIAGNOSIS — R55 Syncope and collapse: Secondary | ICD-10-CM | POA: Diagnosis not present

## 2020-01-26 MED ORDER — EZETIMIBE 10 MG PO TABS
10.0000 mg | ORAL_TABLET | Freq: Every day | ORAL | 2 refills | Status: DC
Start: 2020-01-26 — End: 2020-07-15

## 2020-01-26 NOTE — Patient Instructions (Signed)
Medication Instructions:  Start Zetia 10 mg daily   *If you need a refill on your cardiac medications before your next appointment, please call your pharmacy*  Follow-Up: At [facility], you and your health needs are our priority.  As part of our continuing mission to provide you with exceptional heart care, we have created designated Provider Care Teams.  These Care Teams include your primary Cardiologist (physician) and Advanced Practice Providers (APPs -  Physician Assistants and Nurse Practitioners) who all work together to provide you with the care you need, when you need it.  We recommend signing up for the patient portal called "MyChart".  Sign up information is provided on this After Visit Summary.  MyChart is used to connect with patients for Virtual Visits (Telemedicine).  Patients are able to view lab/test results, encounter notes, upcoming appointments, etc.  Non-urgent messages can be sent to your provider as well.   To learn more about what you can do with MyChart, go to [email or web address].    Your next appointment:   3 month(s)  The format for your next appointment:   In Person  Provider:   Lennie Odor, MD

## 2020-05-02 ENCOUNTER — Ambulatory Visit: Admitting: Cardiovascular Disease

## 2020-07-15 ENCOUNTER — Encounter: Payer: Self-pay | Admitting: Primary Care

## 2020-07-15 ENCOUNTER — Ambulatory Visit (INDEPENDENT_AMBULATORY_CARE_PROVIDER_SITE_OTHER): Admitting: Primary Care

## 2020-07-15 ENCOUNTER — Other Ambulatory Visit: Payer: Self-pay

## 2020-07-15 VITALS — BP 114/72 | HR 79 | Temp 98.3°F | Ht 60.0 in | Wt 236.8 lb

## 2020-07-15 DIAGNOSIS — G4700 Insomnia, unspecified: Secondary | ICD-10-CM

## 2020-07-15 DIAGNOSIS — R000 Tachycardia, unspecified: Secondary | ICD-10-CM | POA: Diagnosis not present

## 2020-07-15 DIAGNOSIS — U071 COVID-19: Secondary | ICD-10-CM

## 2020-07-15 DIAGNOSIS — I10 Essential (primary) hypertension: Secondary | ICD-10-CM | POA: Diagnosis not present

## 2020-07-15 DIAGNOSIS — E785 Hyperlipidemia, unspecified: Secondary | ICD-10-CM

## 2020-07-15 DIAGNOSIS — K219 Gastro-esophageal reflux disease without esophagitis: Secondary | ICD-10-CM

## 2020-07-15 DIAGNOSIS — E1165 Type 2 diabetes mellitus with hyperglycemia: Secondary | ICD-10-CM | POA: Diagnosis not present

## 2020-07-15 DIAGNOSIS — R110 Nausea: Secondary | ICD-10-CM

## 2020-07-15 DIAGNOSIS — R454 Irritability and anger: Secondary | ICD-10-CM

## 2020-07-15 LAB — COMPREHENSIVE METABOLIC PANEL
ALT: 31 U/L (ref 0–35)
AST: 23 U/L (ref 0–37)
Albumin: 3.7 g/dL (ref 3.5–5.2)
Alkaline Phosphatase: 82 U/L (ref 39–117)
BUN: 8 mg/dL (ref 6–23)
CO2: 27 mEq/L (ref 19–32)
Calcium: 9.5 mg/dL (ref 8.4–10.5)
Chloride: 104 mEq/L (ref 96–112)
Creatinine, Ser: 0.71 mg/dL (ref 0.40–1.20)
GFR: 97.83 mL/min (ref >60.00)
Glucose, Bld: 261 mg/dL — ABNORMAL HIGH (ref 70–99)
Potassium: 4 mEq/L (ref 3.5–5.1)
Sodium: 138 mEq/L (ref 135–145)
Total Bilirubin: 0.4 mg/dL (ref 0.2–1.2)
Total Protein: 6.8 g/dL (ref 6.0–8.3)

## 2020-07-15 LAB — CBC
HCT: 39.2 % (ref 36.0–46.0)
Hemoglobin: 13.1 g/dL (ref 12.0–15.0)
MCHC: 33.4 g/dL (ref 30.0–36.0)
MCV: 87.4 fl (ref 78.0–100.0)
Platelets: 216 10*3/uL (ref 150.0–400.0)
RBC: 4.49 Mil/uL (ref 3.87–5.11)
RDW: 13.7 % (ref 11.5–15.5)
WBC: 6 10*3/uL (ref 4.0–10.5)

## 2020-07-15 LAB — LIPID PANEL
Cholesterol: 165 mg/dL (ref 0–200)
HDL: 46.2 mg/dL (ref >39.00)
LDL Cholesterol: 79 mg/dL (ref 0–99)
NonHDL: 119.02
Total CHOL/HDL Ratio: 4
Triglycerides: 199 mg/dL — ABNORMAL HIGH (ref 0.0–149.0)
VLDL: 39.8 mg/dL (ref 0.0–40.0)

## 2020-07-15 LAB — HEMOGLOBIN A1C: Hgb A1c MFr Bld: 9.5 % — ABNORMAL HIGH (ref 4.6–6.5)

## 2020-07-15 MED ORDER — METFORMIN HCL 1000 MG PO TABS: 1000.0000 mg | ORAL_TABLET | Freq: Two times a day (BID) | ORAL | 3 refills | Status: DC

## 2020-07-15 MED ORDER — BUPROPION HCL ER (SR) 100 MG PO TB12: 100.0000 mg | ORAL_TABLET | Freq: Two times a day (BID) | ORAL | 0 refills | Status: DC

## 2020-07-15 MED ORDER — FREESTYLE LIBRE 14 DAY READER DEVI: 0 refills | Status: DC

## 2020-07-15 MED ORDER — TELMISARTAN 40 MG PO TABS: 40.0000 mg | ORAL_TABLET | Freq: Every day | ORAL | 3 refills | Status: DC

## 2020-07-15 MED ORDER — FREESTYLE LIBRE 14 DAY SENSOR MISC: 1 refills | Status: DC

## 2020-07-15 MED ORDER — ONDANSETRON 8 MG PO TBDP: ORAL_TABLET | ORAL | 0 refills | Status: DC

## 2020-07-15 MED ORDER — PREDNISONE 20 MG PO TABS: ORAL_TABLET | ORAL | 0 refills | Status: DC

## 2020-07-15 MED ORDER — NEBIVOLOL HCL 10 MG PO TABS: 10.0000 mg | ORAL_TABLET | Freq: Every day | ORAL | 3 refills | Status: DC

## 2020-07-15 NOTE — Assessment & Plan Note (Signed)
Doing well on Bystolic 10 mg, continue same. Refills provided.

## 2020-07-15 NOTE — Assessment & Plan Note (Signed)
Increased since persistent coughing with Covid-19 infection.   She has Dexilent at home, continue same.

## 2020-07-15 NOTE — Assessment & Plan Note (Signed)
Chronic for 1-2 years since Menopause, no improvement with conservative treatment.  Discussed that venlafaxine ER may be a better option, but she would like to proceed with Wellbutrin.  Rx for Wellbutrin SR 100 mg BID sent to pharmacy, she will update.

## 2020-07-15 NOTE — Assessment & Plan Note (Signed)
Statin and Zetia intolerant.  Repeat lipids pending.

## 2020-07-15 NOTE — Progress Notes (Addendum)
Subjective:    Patient ID: Melissa [name], female    DOB: 09-06-1965, 55 y.o.   MRN: [id number]  HPI  This visit occurred during the SARS-CoV-2 public health emergency.  Safety protocols were in place, including screening questions prior to the visit, additional usage of staff PPE, and extensive cleaning of exam room while observing appropriate contact time as indicated for disinfecting solutions.   Melissa [name] is a 55 year old female with a history of Type 2 Diabetes,hyperlipidemia, Covid-19 infection, hypertension  who presents today to discuss numerous issues and for follow up of diabetes.  1) Type 2 Diabetes:  Current medications include: Metformin 4431 mg BID, Trulicity 1.5 mg weekly.   She injected 10 Toujeo twice over the last 2 weeks to reduce her glucose levels. She had this left over from an older prescription. She would like a refill of Toujeo as she did well on this in the past.   She is checking her blood glucose infrequently times daily and is getting readings of:  150's-170's, some in the low 200's.   Last A1C: 7.6 in August  Last Eye Exam: Completed in December 2021 Last Foot Exam: UTD Pneumonia Vaccination: UTD ACE/ARB: Telmisartan  Statin: Cannot tolerate. Couldn't even tolerate Zetia.   2) Cough: Since Covid-19 diagnoses two weeks ago. Cough has been persistent since, worse at night or when laying down. She's been taking Mucinex, Delsym, Robitussin, cough drops without improvement in cough.   She denies a history of asthma but has been told that she has "asthmatic bronchitis". She is a non smoker. She's used her albuterol inhaler a few times without improvement.  She has noticed persistent esophageal burning. She's been taking Dexilant once daily for the last week which helps temporarily with GERD, no improvement in cough.   She has been nauseated since increased GERD symptoms, would like a refill of her Zofran.   3) Irritability/Anxiety: Chronic for the last 1-2  years since going through menopause. She would like treatment. She was once managed on Wellbutrin SR 100 mg BID in the past for depression and did very well, she would like to try this again.   BP Readings from Last 3 Encounters:  07/15/20 114/72  01/26/20 100/80  01/16/20 128/82     Review of Systems  Constitutional: Negative for fever.  Eyes: Negative for visual disturbance.  Respiratory: Positive for cough and shortness of breath.   Cardiovascular: Negative for chest pain.  Gastrointestinal: Positive for nausea.       GERD  Neurological: Negative for dizziness and headaches.       Past Medical History:  Diagnosis Date  . Asthma   . Complication of anesthesia   . Diabetes mellitus without complication ([place])    Type II  . Dysrhythmia    Tachycardia  . GERD (gastroesophageal reflux disease)   . Pneumonia    2014- 'Walking"  . Shortness of breath dyspnea    with exertion  . Vertigo      Social History   Socioeconomic History  . Marital status: Married    Spouse name: Not on file  . Number of children: Not on file  . Years of education: Not on file  . Highest education level: Not on file  Occupational History  . Not on file  Tobacco Use  . Smoking status: Never Smoker  . Smokeless tobacco: Never Used  Vaping Use  . Vaping Use: Never used  Substance and Sexual Activity  . Alcohol use: Yes  Alcohol/week: 0.0 standard drinks  . Drug use: No  . Sexual activity: Not on file  Other Topics Concern  . Not on file  Social History Narrative   Married.   1 daughter.   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    Social Determinants of Health   Financial Resource Strain: Not on file  Food Insecurity: Not on file  Transportation Needs: Not on file  Physical Activity: Not on file  Stress: Not on file  Social Connections: Not on file  Intimate Partner Violence: Not on file    Past Surgical History:  Procedure Laterality Date  .  CHOLECYSTECTOMY  1997  . COLONOSCOPY  2015  . CYSTOSCOPY     age 26- stretched stem of bladder  . 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 SHOULDER ARTHROSCOPY Left 11/20/2014   Procedure: ARTHROSCOPY SHOULDER WITH MUA, ROTATOR INTERVAL RELEASE;  Surgeon: Meredith Pel, MD;  Location: [facility];  Service: Orthopedics;  Laterality: Left;  LEFT SHOULDER MUA, DOA, ROTATOR INTERVAL RELEASE.  . TONSILLECTOMY  2010    Family History  Adopted: Yes  Problem Relation Age of Onset  . Other Other        adopted    Allergies  Allergen Reactions  . Amoxicillin Anaphylaxis and Other (See Comments)    Has patient had a PCN reaction causing immediate rash, facial/tongue/throat swelling, SOB or lightheadedness with hypotension: Yes Has patient had a PCN reaction causing severe rash involving mucus membranes or skin necrosis: No Has patient had a PCN reaction that required hospitalization No Has patient had a PCN reaction occurring within the last 10 years: No If all of the above answers are "NO", then may proceed with Cephalosporin use.  . Ciprofloxacin Anaphylaxis  . Other Anaphylaxis    Pt states that she can only take Avelox, and Zithromax.    Marland Kitchen Penicillins Anaphylaxis and Other (See Comments)    Has patient had a PCN reaction causing immediate rash, facial/tongue/throat swelling, SOB or lightheadedness with hypotension: Yes Has patient had a PCN reaction causing severe rash involving mucus membranes or skin necrosis: No Has patient had a PCN reaction that required hospitalization No Has patient had a PCN reaction occurring within the last 10 years: No If all of the above answers are "NO", then may proceed with Cephalosporin use.  . Sulfa Antibiotics Anaphylaxis  . Erythromycin Other (See Comments)    GI upset with oral regimen over 10 years ago  . Vicodin [Hydrocodone-Acetaminophen] Itching    Current Outpatient Medications on File Prior to Visit  Medication Sig Dispense Refill  . albuterol  (PROVENTIL HFA;VENTOLIN HFA) 108 (90 Base) MCG/ACT inhaler Inhale 2 puffs into the lungs every 6 (six) hours as needed for wheezing or shortness of breath. 1 Inhaler 2  . blood glucose meter kit and supplies KIT Dispense based on patient and insurance preference. Use 1x a day. (FOR ICD-9 250.00, 250.01). 1 each 0  . glucose blood (FREESTYLE LITE) test strip Use as instructed, test blood sugar once a day 100 each 12  . Insulin Pen Needle 32G X 4 MM MISC Use 1x a day 100 each 3  . Lancets MISC Use 1x a day 100 each 3  . meclizine (ANTIVERT) 25 MG tablet Take 1 tablet (25 mg total) by mouth 3 (three) times daily as needed for dizziness. 30 tablet 0  . medroxyPROGESTERone (DEPO-PROVERA) 150 MG/ML injection Inject 150 mg into the muscle  every 3 (three) months.    . metFORMIN (GLUCOPHAGE) 1000 MG tablet TAKE 1 TABLET TWICE A DAY WITH MEALS (Patient taking differently: Take 1,000 mg by mouth 2 (two) times daily with a meal. ) 180 tablet 3  . nebivolol (BYSTOLIC) 10 MG tablet Take 1 tablet (10 mg total) by mouth daily. 90 tablet 3  . ondansetron (ZOFRAN-ODT) 8 MG disintegrating tablet PLACE 1 TABLET ON THE TONGUE TWICE A DAY AS NEEDED FOR NAUSEA OR VOMITING (Patient taking differently: Take 8 mg by mouth 2 (two) times daily as needed for nausea or vomiting. ) 20 tablet 0  . telmisartan (MICARDIS) 40 MG tablet Take 1 tablet (40 mg total) by mouth daily. For blood pressure. 90 tablet 3  . TRULICITY 1.5 AV/4.0JW SOPN INJECT 1.5 MG INTO THE SKIN ONCE A WEEK FOR DIABETES (Patient taking differently: Inject 1.5 mg into the skin every Friday. For diabetes.) 6 mL 1   No current facility-administered medications on file prior to visit.    BP 114/72   Pulse 79   Temp 98.3 F (36.8 C) (Oral)   Ht 5' (1.524 m)   Wt 236 lb 12.8 oz (107.4 kg)   SpO2 98%   BMI 46.25 kg/m    Objective:   Physical Exam Constitutional:      Appearance: She is well-nourished.  Cardiovascular:     Rate and Rhythm: Normal rate  and regular rhythm.  Pulmonary:     Effort: Pulmonary effort is normal.     Breath sounds: Normal breath sounds.  Musculoskeletal:     Cervical back: Neck supple.  Skin:    General: Skin is warm and dry.  Psychiatric:        Mood and Affect: Mood and affect and mood normal.            Assessment & Plan:

## 2020-07-15 NOTE — Patient Instructions (Addendum)
Start prednisone 20 mg tablets. Take 2 tablets daily for 5 days.  Stop by the lab prior to leaving today. I will notify you of your results once received.   You can try Nyquil at bedtime for cough and rest.  It was a pleasure to see you today!

## 2020-07-15 NOTE — Assessment & Plan Note (Signed)
Diagnosed over 2 weeks ago, is not vaccinated. Lungs today clear.  Suspect ongoing cough is secondary to GERD. No prior diagnosis of asthma.   Will trial oral steroid burst of prednisone 20 mg.  She will update.

## 2020-07-15 NOTE — Assessment & Plan Note (Signed)
Likely uncontrolled, but A1C pending today.  Continue Trulicity 1.5 mg weekly, metformin 1000 mg BID.   Consider Toujeo if warranted, await A1C results. Foot and Eye exam UTD.  Pneumonia vaccin UTD. Managed on ARB. Statin and Zetia intolerant.

## 2020-07-15 NOTE — Assessment & Plan Note (Signed)
No longer on Ambien or Trazodone, continue to monitor.

## 2020-07-15 NOTE — Assessment & Plan Note (Signed)
Increased since GERD increase after Covid-19 infection. Refill provided for Zofran ODT 8 mg.

## 2020-07-16 ENCOUNTER — Other Ambulatory Visit: Payer: Self-pay | Admitting: Primary Care

## 2020-07-16 DIAGNOSIS — E1165 Type 2 diabetes mellitus with hyperglycemia: Secondary | ICD-10-CM

## 2020-07-16 MED ORDER — INSULIN PEN NEEDLE 32G X 4 MM MISC: 0 refills | Status: DC

## 2020-07-16 MED ORDER — TOUJEO MAX SOLOSTAR 300 UNIT/ML ~~LOC~~ SOPN: 10.0000 [IU] | PEN_INJECTOR | Freq: Every day | SUBCUTANEOUS | 0 refills | Status: DC

## 2020-07-17 ENCOUNTER — Telehealth: Payer: Self-pay

## 2020-07-17 NOTE — Telephone Encounter (Signed)
Melissa [name] Key: [id number] help? Call us at [phone] Outcome Additional Information Required This medication may be excluded from the patient's benefit. For more information, please reach out to [employer] directly at [phone]. Message from [employer]: Drug is not covered by plan Drug FreeStyle Libre 14 Day Sensor Form [employer] Electronic PA Form [phone] NCPDP)  Called and updated patient of this information. Patient stated that she was aware and that she has reached out to [facility] who will cover it. Patient stated that the company should be sending [place] a fax with further information.

## 2020-07-22 DIAGNOSIS — U071 COVID-19: Secondary | ICD-10-CM

## 2020-07-23 MED ORDER — ALBUTEROL SULFATE HFA 108 (90 BASE) MCG/ACT IN AERS: 2.0000 | INHALATION_SPRAY | Freq: Four times a day (QID) | RESPIRATORY_TRACT | 0 refills | Status: DC | PRN

## 2020-08-08 ENCOUNTER — Other Ambulatory Visit: Payer: Self-pay

## 2020-08-12 ENCOUNTER — Encounter: Payer: Self-pay | Admitting: Internal Medicine

## 2020-08-12 ENCOUNTER — Other Ambulatory Visit: Payer: Self-pay

## 2020-08-12 ENCOUNTER — Ambulatory Visit (INDEPENDENT_AMBULATORY_CARE_PROVIDER_SITE_OTHER): Admitting: Internal Medicine

## 2020-08-12 DIAGNOSIS — E1165 Type 2 diabetes mellitus with hyperglycemia: Secondary | ICD-10-CM | POA: Diagnosis not present

## 2020-08-12 MED ORDER — TRULICITY 3 MG/0.5ML ~~LOC~~ SOAJ
3.0000 mg | SUBCUTANEOUS | 3 refills | Status: DC
Start: 2020-08-12 — End: 2020-08-12

## 2020-08-12 MED ORDER — TOUJEO MAX SOLOSTAR 300 UNIT/ML ~~LOC~~ SOPN: 28.0000 [IU] | PEN_INJECTOR | Freq: Every day | SUBCUTANEOUS | 3 refills | Status: DC

## 2020-08-12 MED ORDER — TRULICITY 3 MG/0.5ML ~~LOC~~ SOAJ: 3.0000 mg | SUBCUTANEOUS | 3 refills | Status: DC

## 2020-08-12 NOTE — Progress Notes (Signed)
Patient ID: Melissa [name], female   DOB: Apr 19, 1966, 55 y.o.   MRN: [id number]   This visit occurred during the SARS-CoV-2 public health emergency.  Safety protocols were in place, including screening questions prior to the visit, additional usage of staff PPE, and extensive cleaning of exam room while observing appropriate contact time as indicated for disinfecting solutions.   HPI: Melissa [name] is a 55 y.o.-year-old female, returning for f/u for DM2, dx in 1997, insulin-dependent, uncontrolled, without long-term complications. Last OV 1 year and 8 months ago!  She had COVID-19 in 06/2020.  She had prednisone at that time and sugars increased to the 300s.  Reviewed HbA1c levels: Lab Results  Component Value Date   HGBA1C 9.5 Repeated and verified X2. (H) 07/15/2020   HGBA1C 7.6 (A) 01/16/2020   HGBA1C 7.5 (H) 06/08/2019  1997: HbA1c 11%  Investigation for type 1 diabetes was negative: Component     Latest Ref Rng & Units 08/01/2018  C-Peptide     0.80 - 3.85 ng/mL 2.53  Islet Cell Ab     Neg:<1:1 Negative  Glucose, Plasma     65 - 99 mg/dL 100 (H)  ZNT8 Antibodies     U/mL <15  Glutamic Acid Decarb Ab     <5 IU/mL <5   Pt was on a regimen of: - JanuMet 50-1000 mg 2x a day, with meals She was on Toujeo  - had a low: 53 after food. Prev. On insulin  - 2010.  Now on: - Metformin 1000 mg 2x a day with meals  - Trulicity 1.5 mg weekly  - Toujeo 12 units at bedtime-started 03/2018 >> 6 >> 7 >> 8-10 units (in 03-09/2018: 12-16 units). Now 28 units - restarted in last 2 weeks. - R insulin: 5-10 units for correction - once a day ave.  Pt checks sugars 4x a day - with a freestyle libre CGM, which she obtained recently:   Previously: - am: n/c >> 235 >> 120-141 >> 115-117 - 2h after b'fast: n/c - before lunch: n/c - 2h after lunch: n/c - before dinner: n/c >> 206 >> 88 >> n/c - 2h after dinner: 253-337 >> 85-117 >> n/c - bedtime: n/c >> 197, 288 >> 84, 100-112 - nighttime:  n/c Lowest sugar was 50s in the past >> 148 >> 85 >> 84 >> 70s; she has hypoglycemia awareness in the 70s. Highest sugar was 308 >> 337 >> 141 >> 140 x1 >> 300s.  Glucometer: RelioN >> Freestyle lite  -No CKD; last BUN/creatinine:  Lab Results  Component Value Date   BUN 8 07/15/2020   BUN 20 12/26/2019   CREATININE 0.71 07/15/2020   CREATININE 1.02 (H) 12/26/2019  On telmisartan 40.  -+ HL; last set of lipids: Lab Results  Component Value Date   CHOL 165 07/15/2020   HDL 46.20 07/15/2020   LDLCALC 79 07/15/2020   LDLDIRECT 125.0 06/08/2019   TRIG 199.0 (H) 07/15/2020   CHOLHDL 4 07/15/2020  Not on a statin, as she could not tolerate statins and Zetia in the past.  She has fatty liver and a history of transaminitis: Lab Results  Component Value Date   ALT 31 07/15/2020   AST 23 07/15/2020   ALKPHOS 82 07/15/2020   BILITOT 0.4 07/15/2020   - last eye exam was in 09/2019: No DR  - no numbness and tingling in her feet.  Pt is adopted - ? FH of DM.  She lost 55 lbs in 2019 (  [employer]), then gained back 40 pounds.  She relates an allergy to almost all antibiotics, but she can take Levaquin, Z-Pack, Avelox, doxycycline.  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  ROS: Constitutional: + weight gain/no weight loss, no fatigue, no subjective hyperthermia, no subjective hypothermia Eyes: no blurry vision, no xerophthalmia ENT: no sore throat, no nodules palpated in neck, no dysphagia, no odynophagia, no hoarseness Cardiovascular: no CP/no SOB/no palpitations/no leg swelling Respiratory: no cough/no SOB/no wheezing Gastrointestinal: no N/no V/no D/no C/no acid reflux Musculoskeletal: no muscle aches/no joint aches Skin: no rashes, no hair loss Neurological: no tremors/no numbness/no tingling/no dizziness  I reviewed pt's medications, allergies, PMH, social hx, family hx, and changes were documented in the history of present illness. Otherwise, unchanged from my initial  visit note.  Past Medical History:  Diagnosis Date  . Asthma   . Complication of anesthesia   . Diabetes mellitus without complication ([place])    Type II  . Dysrhythmia    Tachycardia  . GERD (gastroesophageal reflux disease)   . Pneumonia    2014- 'Walking"  . Shortness of breath dyspnea    with exertion  . Vertigo    Past Surgical History:  Procedure Laterality Date  . CHOLECYSTECTOMY  1997  . COLONOSCOPY  2015  . CYSTOSCOPY     age 34- stretched stem of bladder  . 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 SHOULDER ARTHROSCOPY Left 11/20/2014   Procedure: ARTHROSCOPY SHOULDER WITH MUA, ROTATOR INTERVAL RELEASE;  Surgeon: Meredith Pel, MD;  Location: [facility];  Service: Orthopedics;  Laterality: Left;  LEFT SHOULDER MUA, DOA, ROTATOR INTERVAL RELEASE.  . TONSILLECTOMY  2010   Social History   Socioeconomic History  . Marital status: Married    Spouse name: Not on file  . Number of children: 1  . Years of education: Not on file  . Highest education level: Not on file  Occupational History  .   Tobacco Use  . Smoking status: Never Smoker  . Smokeless tobacco: Never Used  Substance and Sexual Activity  . Alcohol use: Yes    Alcohol/week: 0.0 oz    Comment: rare  . Drug use: No      Social History Narrative   Married.   1 daughter.   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    Current Outpatient Medications on File Prior to Visit  Medication Sig Dispense Refill  . albuterol (VENTOLIN HFA) 108 (90 Base) MCG/ACT inhaler Inhale 2 puffs into the lungs every 6 (six) hours as needed for wheezing or shortness of breath. 1 each 0  . blood glucose meter kit and supplies KIT Dispense based on patient and insurance preference. Use 1x a day. (FOR ICD-9 250.00, 250.01). 1 each 0  . buPROPion (WELLBUTRIN SR) 100 MG 12 hr tablet Take 1 tablet (100 mg total) by mouth 2 (two) times daily. 180 tablet 0  . Continuous Blood Gluc Receiver (FREESTYLE LIBRE 14 DAY  READER) DEVI Use with sensor to check blood sugar. 1 each 0  . Continuous Blood Gluc Sensor (FREESTYLE LIBRE 14 DAY SENSOR) MISC Use as directed to check blood sugars. 6 each 1  . glucose blood (FREESTYLE LITE) test strip Use as instructed, test blood sugar once a day 100 each 12  . insulin glargine, 2 Unit Dial, (TOUJEO MAX SOLOSTAR) 300 UNIT/ML Solostar Pen Inject 10 Units into the skin at bedtime. For diabetes 15 mL 0  .  Insulin Pen Needle 32G X 4 MM MISC Use 1x a day 100 each 0  . Lancets MISC Use 1x a day 100 each 3  . meclizine (ANTIVERT) 25 MG tablet Take 1 tablet (25 mg total) by mouth 3 (three) times daily as needed for dizziness. 30 tablet 0  . medroxyPROGESTERone (DEPO-PROVERA) 150 MG/ML injection Inject 150 mg into the muscle every 3 (three) months.    . metFORMIN (GLUCOPHAGE) 1000 MG tablet Take 1 tablet (1,000 mg total) by mouth 2 (two) times daily with a meal. 180 tablet 3  . nebivolol (BYSTOLIC) 10 MG tablet Take 1 tablet (10 mg total) by mouth daily. 90 tablet 3  . ondansetron (ZOFRAN-ODT) 8 MG disintegrating tablet PLACE 1 TABLET ON THE TONGUE TWICE A DAY AS NEEDED FOR NAUSEA OR VOMITING 20 tablet 0  . predniSONE (DELTASONE) 20 MG tablet Take 2 tablets by mouth once daily for five days. 10 tablet 0  . telmisartan (MICARDIS) 40 MG tablet Take 1 tablet (40 mg total) by mouth daily. For blood pressure. 90 tablet 3  . TRULICITY 1.5 MG/8.6PY SOPN INJECT 1.5 MG INTO THE SKIN ONCE A WEEK FOR DIABETES (Patient taking differently: Inject 1.5 mg into the skin every Friday. For diabetes.) 6 mL 1   No current facility-administered medications on file prior to visit.   Allergies  Allergen Reactions  . Amoxicillin Anaphylaxis and Other (See Comments)    Has patient had a PCN reaction causing immediate rash, facial/tongue/throat swelling, SOB or lightheadedness with hypotension: Yes Has patient had a PCN reaction causing severe rash involving mucus membranes or skin necrosis: No Has patient  had a PCN reaction that required hospitalization No Has patient had a PCN reaction occurring within the last 10 years: No If all of the above answers are "NO", then may proceed with Cephalosporin use.  . Ciprofloxacin Anaphylaxis  . Other Anaphylaxis    Pt states that she can only take Avelox, and Zithromax.    Marland Kitchen Penicillins Anaphylaxis and Other (See Comments)    Has patient had a PCN reaction causing immediate rash, facial/tongue/throat swelling, SOB or lightheadedness with hypotension: Yes Has patient had a PCN reaction causing severe rash involving mucus membranes or skin necrosis: No Has patient had a PCN reaction that required hospitalization No Has patient had a PCN reaction occurring within the last 10 years: No If all of the above answers are "NO", then may proceed with Cephalosporin use.  . Sulfa Antibiotics Anaphylaxis  . Erythromycin Other (See Comments)    GI upset with oral regimen over 10 years ago  . Vicodin [Hydrocodone-Acetaminophen] Itching   Family History  Adopted: Yes  Problem Relation Age of Onset  . Other Other        adopted    PE: BP 120/78   Pulse 92   Ht 5' (1.524 m)   Wt 234 lb 9.6 oz (106.4 kg)   SpO2 97%   BMI 45.82 kg/m  Wt Readings from Last 3 Encounters:  08/12/20 234 lb 9.6 oz (106.4 kg)  07/15/20 236 lb 12.8 oz (107.4 kg)  01/26/20 223 lb 12.8 oz (101.5 kg)   Constitutional: overweight, in NAD Eyes: PERRLA, EOMI, no exophthalmos ENT: moist mucous membranes, no thyromegaly, no cervical lymphadenopathy Cardiovascular: RRR, No MRG Respiratory: CTA B Gastrointestinal: abdomen soft, NT, ND, BS+ Musculoskeletal: no deformities, strength intact in all 4 Skin: moist, warm, no rashes Neurological: no tremor with outstretched hands, DTR normal in all 4  ASSESSMENT: 1. DM2, insulin-dependent, uncontrolled,  without long-term complications, but with hyperglycemia  2.  Hyperlipidemia  3.  Obesity  PLAN:  1. Patient with longstanding,  uncontrolled, type 2 diabetes, on oral antidiabetic regimen with Metformin, also daily long-acting insulin and weekly GLP-1 receptor agonist.  Her sugars improved significantly after improving her diet and eliminating starches and concentrated sweets along with fast foods: From 10.1% to 6.7%.  At that time, we started to decrease her long-acting insulin.  We did check her for type 1 diabetes and the investigation was negative.  Before last visit, however, she relaxed her diet at the start of the coronavirus pandemic due to stress.  Afterwards, she was lost for follow-up, now returning after 1 year and 8 months.  Latest HbA1c obtained last month was very high, at 9.5%.  This was checked after she had COVID-19 in 06/2020 and was on prednisone. -Since then, she started to work on her diet and medication compliance and also obtained a CGM so she could check her blood sugars more frequently.  She feels that being able to use a CGM made a huge change in her diabetes control.  However, she needs a letter of attestation that she really needs it for the insurance to cover it... CGM interpretation: -At today's visit, we reviewed her CGM downloads: It appears that 80% of values are in target range (goal >70%), while 20% are higher than 180 (goal <25%), and 0% are lower than 70 (goal <4%).  The calculated average blood sugar is 155.  The projected HbA1c for the next 3 months (GMI) is 7.0%, a significant improvement. -Reviewing the CGM trends, it appears that the vast majority of her blood sugars are at goal.  Higher blood sugars are seen after breakfast and after dinner.  Overnight, sugars are stable and in target range. -At this visit, we discussed about increasing her Trulicity dose from 1.5 to 3 mg weekly.  This will cover her postprandial excursions.  We discussed that after she starts Trulicity she may need to decrease the dose of Toujeo.  I also advised her to stop regular insulin. - I suggested to:  Patient  Instructions  Please continue: - Metformin 1000 mg 2x a day with meals - Toujeo 28 units daily at bedtime (may need to decrease to 24 units daily if needed)  Please increase: - Trulicity 3 mg weekly  Please come back for a follow-up appointment in 3 months.  - advised to check sugars at different times of the day - 2x a day, rotating check times - advised for yearly eye exams >> she is UTD - return to clinic in 3-4 months  2.  Hyperlipidemia -Reviewed latest lipid panel from 06/2020: LDL improved, at goal, the rest of the fractions also at goal: Lab Results  Component Value Date   CHOL 165 07/15/2020   HDL 46.20 07/15/2020   LDLCALC 79 07/15/2020   LDLDIRECT 125.0 06/08/2019   TRIG 199.0 (H) 07/15/2020   CHOLHDL 4 07/15/2020  -Not on a statin.  She did see cardiology since her last visit and I suggested to retry Zetia.  She did so and again developed mental fog and joint pains.  She is now off statins and ezetimibe.  3.  Obesity -We will continue Trulicity which should also help with weight loss -She was on Herbalife shakes at last visit -Before last visit, she lost 20 pounds. At this visit, she gained 23 pounds compared to last visit -increasing the Trulicity dose was held  Philemon Kingdom, MD  PhD [facility]

## 2020-08-12 NOTE — Patient Instructions (Signed)
Please continue: - Metformin 1000 mg 2x a day with meals - Toujeo 28 units daily at bedtime (may need to decrease to 24 units daily if needed)  Please increase: - Trulicity 3 mg weekly  Please come back for a follow-up appointment in 3 months.

## 2020-08-13 NOTE — Telephone Encounter (Signed)
FYI  During her last visit patient was not checking her glucose daily, was also not injecting medication more than 3 times daily. Given her recent report, we can proceed with a letter of attestation, placed in Melissa [name]'s inbox.

## 2020-08-17 ENCOUNTER — Other Ambulatory Visit: Payer: Self-pay | Admitting: Primary Care

## 2020-08-17 DIAGNOSIS — U071 COVID-19: Secondary | ICD-10-CM

## 2020-08-22 ENCOUNTER — Telehealth (INDEPENDENT_AMBULATORY_CARE_PROVIDER_SITE_OTHER): Admitting: Family Medicine

## 2020-08-22 DIAGNOSIS — Z8616 Personal history of COVID-19: Secondary | ICD-10-CM

## 2020-08-22 DIAGNOSIS — Z8709 Personal history of other diseases of the respiratory system: Secondary | ICD-10-CM

## 2020-08-22 DIAGNOSIS — R059 Cough, unspecified: Secondary | ICD-10-CM | POA: Diagnosis not present

## 2020-08-22 MED ORDER — DOXYCYCLINE HYCLATE 100 MG PO TABS: 100.0000 mg | ORAL_TABLET | Freq: Two times a day (BID) | ORAL | 0 refills | Status: DC

## 2020-08-22 MED ORDER — FLOVENT HFA 44 MCG/ACT IN AERO
2.0000 | INHALATION_SPRAY | Freq: Two times a day (BID) | RESPIRATORY_TRACT | 12 refills | Status: DC
Start: 2020-08-22 — End: 2021-04-03

## 2020-08-22 NOTE — Progress Notes (Signed)
Virtual Visit via Video Note  I connected with Melissa [name]  on 08/22/20 at  4:00 PM EST by a video enabled telemedicine application and verified that I am speaking with the correct person using two identifiers.  Location patient: home, [place] Location provider:work or home office Persons participating in the virtual visit: patient, provider  I discussed the limitations of evaluation and management by telemedicine and the availability of in person appointments. The patient expressed understanding and agreed to proceed.   HPI:  Acute telemedicine visit for cough: -Onset: 7-8 weeks ago with COVID (not hospitalized or treated) -Symptoms include: persistent cough, now productive of white sputum at time, mild SOB since had covid; has seen PCP for this - reports was given prednisone but it did not help and ran her sugars up -SOB not worsening -fatigue improving -alb doesn't seem to help either -Denies:hemoptysis, CP, NVD, inability to eat/drink or get out of bed -Has tried: alb, cough drops, tessalon -Pertinent past medical history: DM, obesity, reports hx of "asthmatic bronchitis" every year when gets flu or resp infection -Pertinent medication allergies: amoxicillin, cipro, sulfa, erythromycin, vicodin -COVID-19 vaccine status: not vaccinated  ROS: See pertinent positives and negatives per HPI.  Past Medical History:  Diagnosis Date  . Asthma   . Complication of anesthesia   . Diabetes mellitus without complication ([place])    Type II  . Dysrhythmia    Tachycardia  . GERD (gastroesophageal reflux disease)   . Pneumonia    2014- 'Walking"  . Shortness of breath dyspnea    with exertion  . Vertigo     Past Surgical History:  Procedure Laterality Date  . CHOLECYSTECTOMY  1997  . COLONOSCOPY  2015  . CYSTOSCOPY     age 73- stretched stem of bladder  . 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 SHOULDER ARTHROSCOPY Left 11/20/2014   Procedure: ARTHROSCOPY SHOULDER WITH MUA, ROTATOR INTERVAL RELEASE;   Surgeon: Meredith Pel, MD;  Location: [place];  Service: Orthopedics;  Laterality: Left;  LEFT SHOULDER MUA, DOA, ROTATOR INTERVAL RELEASE.  . TONSILLECTOMY  2010     Current Outpatient Medications:  .  doxycycline (VIBRA-TABS) 100 MG tablet, Take 1 tablet (100 mg total) by mouth 2 (two) times daily., Disp: 20 tablet, Rfl: 0 .  fluticasone (FLOVENT HFA) 44 MCG/ACT inhaler, Inhale 2 puffs into the lungs in the morning and at bedtime., Disp: 1 each, Rfl: 12 .  albuterol (VENTOLIN HFA) 108 (90 Base) MCG/ACT inhaler, Inhale 2 puffs into the lungs every 6 (six) hours as needed for wheezing or shortness of breath., Disp: 1 each, Rfl: 0 .  blood glucose meter kit and supplies KIT, Dispense based on patient and insurance preference. Use 1x a day. (FOR ICD-9 250.00, 250.01)., Disp: 1 each, Rfl: 0 .  buPROPion (WELLBUTRIN SR) 100 MG 12 hr tablet, Take 1 tablet (100 mg total) by mouth 2 (two) times daily., Disp: 180 tablet, Rfl: 0 .  Continuous Blood Gluc Receiver (FREESTYLE LIBRE 14 DAY READER) DEVI, Use with sensor to check blood sugar., Disp: 1 each, Rfl: 0 .  Continuous Blood Gluc Sensor (FREESTYLE LIBRE 14 DAY SENSOR) MISC, Use as directed to check blood sugars., Disp: 6 each, Rfl: 1 .  Dulaglutide (TRULICITY) 3 ZY/2.4MG SOPN, Inject 3 mg into the skin once a week., Disp: 6 mL, Rfl: 3 .  glucose blood (FREESTYLE LITE) test strip, Use as instructed, test blood sugar once a day, Disp: 100 each, Rfl: 12 .  insulin  glargine, 2 Unit Dial, (TOUJEO MAX SOLOSTAR) 300 UNIT/ML Solostar Pen, Inject 28 Units into the skin at bedtime. For diabetes, Disp: 30 mL, Rfl: 3 .  Insulin Pen Needle 32G X 4 MM MISC, Use 1x a day, Disp: 100 each, Rfl: 0 .  Lancets MISC, Use 1x a day, Disp: 100 each, Rfl: 3 .  medroxyPROGESTERone (DEPO-PROVERA) 150 MG/ML injection, Inject 150 mg into the muscle every 3 (three) months., Disp: , Rfl:  .  metFORMIN (GLUCOPHAGE) 1000 MG tablet, Take 1 tablet (1,000 mg total) by mouth 2 (two)  times daily with a meal., Disp: 180 tablet, Rfl: 3 .  nebivolol (BYSTOLIC) 10 MG tablet, Take 1 tablet (10 mg total) by mouth daily., Disp: 90 tablet, Rfl: 3 .  ondansetron (ZOFRAN-ODT) 8 MG disintegrating tablet, PLACE 1 TABLET ON THE TONGUE TWICE A DAY AS NEEDED FOR NAUSEA OR VOMITING, Disp: 20 tablet, Rfl: 0 .  telmisartan (MICARDIS) 40 MG tablet, Take 1 tablet (40 mg total) by mouth daily. For blood pressure., Disp: 90 tablet, Rfl: 3  EXAM:  VITALS per patient if applicable:  GENERAL: alert, oriented, appears well and in no acute distress  HEENT: atraumatic, conjunttiva clear, no obvious abnormalities on inspection of external nose and ears  NECK: normal movements of the head and neck  LUNGS: on inspection no signs of respiratory distress, breathing rate appears normal, no obvious gross SOB, gasping or wheezing  CV: no obvious cyanosis  MS: moves all visible extremities without noticeable abnormality  PSYCH/NEURO: pleasant and cooperative, no obvious depression or anxiety, speech and thought processing grossly intact  ASSESSMENT AND PLAN:  Discussed the following assessment and plan:  Cough  History of COVID-19  History of bronchitis  -we discussed possible serious and likely etiologies, options for evaluation and workup, limitations of telemedicine visit vs in person visit, treatment, treatment risks and precautions. Pt prefers to treat via telemedicine empirically rather than in person at this moment.  Query post viral, possible secondary bacterial respiratory illness, possible bronchitis versus other.  Seems she has a history of asthma per her report.  I have seen some patients with Covid with a long term cough and respiratory symptoms.  After lengthy discussion of options for evaluation versus trial some treatments, she opted for doxycycline 100 mg twice daily for 10 days, inhaled corticosteroid for 2 weeks and close follow-up with her primary care doctor.  She agrees to  schedule a PCP follow-up in 1 to 2 weeks. Advised to seek prompt in person care if worsening, new symptoms arise, or if is not improving with treatment sooner than her 1 to 2-week follow-up if needed. Discussed options for inperson care if PCP office not available. Did let this patient know that I only do telemedicine on Tuesdays and Thursdays for [place]. Advised to schedule follow up visit with PCP or [facility] if any further questions or concerns to avoid delays in care.   I discussed the assessment and treatment plan with the patient. The patient was provided an opportunity to ask questions and all were answered. The patient agreed with the plan and demonstrated an understanding of the instructions.     Lucretia Kern, DO

## 2020-08-22 NOTE — Patient Instructions (Signed)
-  I sent the medication(s) we discussed to your pharmacy: Meds ordered this encounter  Medications  . doxycycline (VIBRA-TABS) 100 MG tablet    Sig: Take 1 tablet (100 mg total) by mouth 2 (two) times daily.    Dispense:  20 tablet    Refill:  0  . fluticasone (FLOVENT HFA) 44 MCG/ACT inhaler    Sig: Inhale 2 puffs into the lungs in the morning and at bedtime.    Dispense:  1 each    Refill:  12   Schedule a 1 to 2-week in person follow-up appointment with your primary care office. I hope you are feeling better soon!  Seek in person care sooner promptly if your symptoms worsen, new concerns arise or you are not improving with treatment.  It was nice to meet you today. I help [facility] out with telemedicine visits on Tuesdays and Thursdays and am available for visits on those days. If you have any concerns or questions following this visit please schedule a follow up visit with your Primary Care doctor or seek care at a local urgent care clinic to avoid delays in care.

## 2020-10-10 ENCOUNTER — Other Ambulatory Visit: Payer: Self-pay | Admitting: Primary Care

## 2020-10-10 DIAGNOSIS — R454 Irritability and anger: Secondary | ICD-10-CM

## 2020-11-18 ENCOUNTER — Other Ambulatory Visit: Payer: Self-pay

## 2020-11-18 ENCOUNTER — Encounter: Payer: Self-pay | Admitting: Internal Medicine

## 2020-11-18 ENCOUNTER — Ambulatory Visit (INDEPENDENT_AMBULATORY_CARE_PROVIDER_SITE_OTHER): Admitting: Internal Medicine

## 2020-11-18 VITALS — BP 128/90 | HR 107 | Ht 60.0 in | Wt 242.4 lb

## 2020-11-18 DIAGNOSIS — E1165 Type 2 diabetes mellitus with hyperglycemia: Secondary | ICD-10-CM | POA: Diagnosis not present

## 2020-11-18 DIAGNOSIS — Z6841 Body Mass Index (BMI) 40.0 and over, adult: Secondary | ICD-10-CM

## 2020-11-18 DIAGNOSIS — E785 Hyperlipidemia, unspecified: Secondary | ICD-10-CM | POA: Diagnosis not present

## 2020-11-18 LAB — POCT GLYCOSYLATED HEMOGLOBIN (HGB A1C): Hemoglobin A1C: 7.3 % — AB (ref 4.0–5.6)

## 2020-11-18 NOTE — Progress Notes (Signed)
Patient ID: Melissa [name], female   DOB: [birth year], 55 y.o.   MRN: [id number]   This visit occurred during the SARS-CoV-2 public health emergency.  Safety protocols were in place, including screening questions prior to the visit, additional usage of staff PPE, and extensive cleaning of exam room while observing appropriate contact time as indicated for disinfecting solutions.   HPI: Melissa [name] is a 55 y.o.-year-old female, returning for f/u for DM2, dx in 1997, insulin-dependent, uncontrolled, without long-term complications. Last OV 4.5 months ago.  Interim history: No increased urination, nausea, chest pain. She does not sleep well. She also gained weight. Blurry vision improved since last visit.  Reviewed HbA1c levels: Lab Results  Component Value Date   HGBA1C 9.5 Repeated and verified X2. (H) 07/15/2020   HGBA1C 7.6 (A) 01/16/2020   HGBA1C 7.5 (H) 06/08/2019  1997: HbA1c 11%  She is on: - Metformin 1000 mg 2x a day with meals  - Trulicity 1.5 >> 3 mg weekly  - Toujeo  28 >> 34 units daily -  Previously on JanuMet.  Pt checks sugars 4x a day - with the freestyle libre CGM:   Previously:  Lowest sugar was 50s in the past >>... 84 >> 70s >> 65; she has hypoglycemia awareness in the 21s. Highest sugar was 140 x1 >> 300s >> 300x 1-2.  Glucometer: RelioN >> Freestyle lite  -No CKD; last BUN/creatinine:  Lab Results  Component Value Date   BUN 8 07/15/2020   BUN 20 12/26/2019   CREATININE 0.71 07/15/2020   CREATININE 1.02 (H) 12/26/2019  On telmisartan 40.  -+ HL; last set of lipids: Lab Results  Component Value Date   CHOL 165 07/15/2020   HDL 46.20 07/15/2020   LDLCALC 79 07/15/2020   LDLDIRECT 125.0 06/08/2019   TRIG 199.0 (H) 07/15/2020   CHOLHDL 4 07/15/2020  Not on a statin, as she could not tolerate statins and Zetia in the past.  She has fatty liver and a history of transaminitis: Lab Results  Component Value Date   ALT 31 07/15/2020   AST 23  07/15/2020   ALKPHOS 82 07/15/2020   BILITOT 0.4 07/15/2020   - last eye exam was in 08/2020: No DR reportedly.  - no numbness and tingling in her feet.  Pt is adopted - ? FH of DM.  She had COVID-19 in 06/2020.  She had prednisone at that time and sugars increased to the 300s.  She lost 55 lbs in 2019 (Her[employer]), then gained back 40 pounds.  She relates an allergy to almost all antibiotics, but she can take Levaquin, Z-Pack, Avelox, doxycycline.  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  ROS: Constitutional: + weight gain/no weight loss, no fatigue, no subjective hyperthermia, no subjective hypothermia Eyes: no blurry vision, no xerophthalmia ENT: no sore throat, no nodules palpated in neck, no dysphagia, no odynophagia, no hoarseness Cardiovascular: no CP/no SOB/no palpitations/no leg swelling Respiratory: no cough/no SOB/no wheezing Gastrointestinal: no N/no V/no D/no C/no acid reflux Musculoskeletal: no muscle aches/no joint aches Skin: no rashes, no hair loss Neurological: no tremors/no numbness/no tingling/no dizziness  I reviewed pt's medications, allergies, PMH, social hx, family hx, and changes were documented in the history of present illness. Otherwise, unchanged from my initial visit note.  Past Medical History:  Diagnosis Date  . Asthma   . Complication of anesthesia   . Diabetes mellitus without complication ([place])    Type II  . Dysrhythmia    Tachycardia  .  GERD (gastroesophageal reflux disease)   . Pneumonia    2014- 'Walking"  . Shortness of breath dyspnea    with exertion  . Vertigo    Past Surgical History:  Procedure Laterality Date  . CHOLECYSTECTOMY  1997  . COLONOSCOPY  2015  . CYSTOSCOPY     age 68- stretched stem of bladder  . 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 SHOULDER ARTHROSCOPY Left 11/20/2014   Procedure: ARTHROSCOPY SHOULDER WITH MUA, ROTATOR INTERVAL RELEASE;  Surgeon: Meredith Pel, MD;  Location: [facility];  Service: Orthopedics;   Laterality: Left;  LEFT SHOULDER MUA, DOA, ROTATOR INTERVAL RELEASE.  . TONSILLECTOMY  2010   Social History   Socioeconomic History  . Marital status: Married    Spouse name: Not on file  . Number of children: 1  . Years of education: Not on file  . Highest education level: Not on file  Occupational History  .   Tobacco Use  . Smoking status: Never Smoker  . Smokeless tobacco: Never Used  Substance and Sexual Activity  . Alcohol use: Yes    Alcohol/week: 0.0 oz    Comment: rare  . Drug use: No      Social History Narrative   Married.   1 daughter.   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    Current Outpatient Medications on File Prior to Visit  Medication Sig Dispense Refill  . albuterol (VENTOLIN HFA) 108 (90 Base) MCG/ACT inhaler Inhale 2 puffs into the lungs every 6 (six) hours as needed for wheezing or shortness of breath. 1 each 0  . blood glucose meter kit and supplies KIT Dispense based on patient and insurance preference. Use 1x a day. (FOR ICD-9 250.00, 250.01). 1 each 0  . buPROPion (WELLBUTRIN SR) 100 MG 12 hr tablet TAKE 1 TABLET(100 MG) BY MOUTH TWICE DAILY 180 tablet 2  . Continuous Blood Gluc Receiver (FREESTYLE LIBRE 14 DAY READER) DEVI Use with sensor to check blood sugar. 1 each 0  . Continuous Blood Gluc Sensor (FREESTYLE LIBRE 14 DAY SENSOR) MISC Use as directed to check blood sugars. 6 each 1  . doxycycline (VIBRA-TABS) 100 MG tablet Take 1 tablet (100 mg total) by mouth 2 (two) times daily. 20 tablet 0  . Dulaglutide (TRULICITY) 3 RD/4.0CX SOPN Inject 3 mg into the skin once a week. 6 mL 3  . fluticasone (FLOVENT HFA) 44 MCG/ACT inhaler Inhale 2 puffs into the lungs in the morning and at bedtime. 1 each 12  . glucose blood (FREESTYLE LITE) test strip Use as instructed, test blood sugar once a day 100 each 12  . insulin glargine, 2 Unit Dial, (TOUJEO MAX SOLOSTAR) 300 UNIT/ML Solostar Pen Inject 28 Units into the skin at bedtime.  For diabetes 30 mL 3  . Insulin Pen Needle 32G X 4 MM MISC Use 1x a day 100 each 0  . Lancets MISC Use 1x a day 100 each 3  . medroxyPROGESTERone (DEPO-PROVERA) 150 MG/ML injection Inject 150 mg into the muscle every 3 (three) months.    . metFORMIN (GLUCOPHAGE) 1000 MG tablet Take 1 tablet (1,000 mg total) by mouth 2 (two) times daily with a meal. 180 tablet 3  . nebivolol (BYSTOLIC) 10 MG tablet Take 1 tablet (10 mg total) by mouth daily. 90 tablet 3  . ondansetron (ZOFRAN-ODT) 8 MG disintegrating tablet PLACE 1 TABLET ON THE TONGUE TWICE A DAY AS NEEDED FOR NAUSEA OR  VOMITING 20 tablet 0  . telmisartan (MICARDIS) 40 MG tablet Take 1 tablet (40 mg total) by mouth daily. For blood pressure. 90 tablet 3   No current facility-administered medications on file prior to visit.   Allergies  Allergen Reactions  . Amoxicillin Anaphylaxis and Other (See Comments)    Has patient had a PCN reaction causing immediate rash, facial/tongue/throat swelling, SOB or lightheadedness with hypotension: Yes Has patient had a PCN reaction causing severe rash involving mucus membranes or skin necrosis: No Has patient had a PCN reaction that required hospitalization No Has patient had a PCN reaction occurring within the last 10 years: No If all of the above answers are "NO", then may proceed with Cephalosporin use.  . Ciprofloxacin Anaphylaxis  . Other Anaphylaxis    Pt states that she can only take Avelox, and Zithromax.    Marland Kitchen Penicillins Anaphylaxis and Other (See Comments)    Has patient had a PCN reaction causing immediate rash, facial/tongue/throat swelling, SOB or lightheadedness with hypotension: Yes Has patient had a PCN reaction causing severe rash involving mucus membranes or skin necrosis: No Has patient had a PCN reaction that required hospitalization No Has patient had a PCN reaction occurring within the last 10 years: No If all of the above answers are "NO", then may proceed with Cephalosporin use.   . Sulfa Antibiotics Anaphylaxis  . Erythromycin Other (See Comments)    GI upset with oral regimen over 10 years ago  . Vicodin [Hydrocodone-Acetaminophen] Itching   Family History  Adopted: Yes  Problem Relation Age of Onset  . Other Other        adopted    PE: BP 128/90 (BP Location: Right Arm, Patient Position: Sitting, Cuff Size: Normal)   Pulse (!) 107   Ht 5' (1.524 m)   Wt 242 lb 6.4 oz (110 kg)   SpO2 97%   BMI 47.34 kg/m  Wt Readings from Last 3 Encounters:  11/18/20 242 lb 6.4 oz (110 kg)  08/12/20 234 lb 9.6 oz (106.4 kg)  07/15/20 236 lb 12.8 oz (107.4 kg)   Constitutional: overweight, in NAD Eyes: PERRLA, EOMI, no exophthalmos ENT: moist mucous membranes, no thyromegaly, no cervical lymphadenopathy Cardiovascular: tachycardia, RR, No MRG Respiratory: CTA B Gastrointestinal: abdomen soft, NT, ND, BS+ Musculoskeletal: no deformities, strength intact in all 4 Skin: moist, warm, no rashes Neurological: no tremor with outstretched hands, DTR normal in all 4  ASSESSMENT: 1. DM2, insulin-dependent, uncontrolled, without long-term complications, but with hyperglycemia  Investigation for type 1 diabetes was negative: Component     Latest Ref Rng & Units 08/01/2018  C-Peptide     0.80 - 3.85 ng/mL 2.53  Islet Cell Ab     Neg:<1:1 Negative  Glucose, Plasma     65 - 99 mg/dL 100 (H)  ZNT8 Antibodies     U/mL <15  Glutamic Acid Decarb Ab     <5 IU/mL <5   2.  Hyperlipidemia  3.  Obesity class III  PLAN:  1. Patient with longstanding, uncontrolled, type 2 diabetes, on oral antidiabetic regimen with metformin, also long-acting insulin and weekly GLP-1 receptor agonist, increased at last visit.  Her sugars improved significantly after improving her diet and eliminating starches and concentrated sweets along with fast foods.  HbA1c decreased from 10.1% to 6.7%.  At that time, we started to decrease her long-acting insulin.  We did check her for type 1 diabetes  and the investigation was negative.  However, before last visit, she  started to relax her diet and her sugars increase during the coronavirus pandemic.  She was then lost for follow-up.  When I last saw her, HbA1c was 9.5% after she had COVID-19 in 06/2020 and was on prednisone.  Afterwards, she again started work on her diet and medication compliance and obtain a CGM, which made a huge difference in her diabetes control.  At last visit, per review of CGM trends, the vast majority of her blood sugars were at goal with only occasional higher blood sugars after breakfast and after dinner.  Overnight, sugars were stable and in target range. CGM interpretation: -At today's visit, we reviewed her CGM downloads: It appears that 90% of values are in target range (goal >70%), while 10% are higher than 180 (goal <25%), and 0% are lower than 70 (goal <4%).  The calculated average blood sugar is 146.  The projected HbA1c for the next 3 months (GMI) is 6.8%. -Reviewing the CGM trends, sugars are almost entirely fluctuating in the normal range, with slightly more variability overnight, but still controlled blood sugars and slight increases after the 3 main meals, but not above 180.  Overall, they appeared improved from last visit. -For now, I would not recommend any changes in her regimen, however, she is frustrated with her weight gain and we discussed that we can increase the dose of Trulicity to 4.5 mg weekly when she runs out of her 3 mg supply (she has a 14-monthsupply at home). - I suggested to:  Patient Instructions  Please continue: - Metformin 1000 mg 2x a day with meals - Toujeo 34 units daily at bedtime  - Trulicity 3 mg weekly  Please come back for a follow-up appointment in 4 months.  - we checked her HbA1c: 7.3% (much better) - advised to check sugars at different times of the day - 1-2x a day, rotating check times - advised for yearly eye exams >> she is UTD - return to clinic in 4 months  2.   Hyperlipidemia -Reviewed latest lipid panel from 06/2020: LDL improved, at goal, the rest of the fractions also at goal: Lab Results  Component Value Date   CHOL 165 07/15/2020   HDL 46.20 07/15/2020   LDLCALC 79 07/15/2020   LDLDIRECT 125.0 06/08/2019   TRIG 199.0 (H) 07/15/2020   CHOLHDL 4 07/15/2020  -Not on a statin.  We tried Zetia but she developed mental fog and joint aches.  She sees cardiology.  3.  Obesity class III -We will continue Trulicity which should also help with weight loss.  We increased the dose at last visit. -At last visit, she gained 23 pounds compared to the previous visit, but before she lost 20 pounds on Herbalife shakes - since last OV, she gained 8 more lbs.  She attributes part of this to menopause. -After she runs out of the current 3 mg Trulicity stock, plan to increase the dose to 4.5 mg weekly  CPhilemon Kingdom MD PhD LNov[facility] Center[facility]ocrinology

## 2020-11-18 NOTE — Patient Instructions (Addendum)
Please continue: - Metformin 1000 mg 2x a day with meals - Toujeo 34 units daily at bedtime  - Trulicity 3 mg weekly  Please come back for a follow-up appointment in 4 months.

## 2020-11-20 ENCOUNTER — Other Ambulatory Visit: Payer: Self-pay | Admitting: Family Medicine

## 2020-11-20 DIAGNOSIS — M8938 Hypertrophy of bone, other site: Secondary | ICD-10-CM

## 2020-12-02 ENCOUNTER — Other Ambulatory Visit: Payer: Self-pay

## 2020-12-02 ENCOUNTER — Ambulatory Visit
Admission: RE | Admit: 2020-12-02 | Discharge: 2020-12-02 | Disposition: A | Discharge status: Home / Self Care | Source: Ambulatory Visit | Attending: Family Medicine | Admitting: Family Medicine

## 2020-12-02 DIAGNOSIS — M8938 Hypertrophy of bone, other site: Secondary | ICD-10-CM | POA: Diagnosis not present

## 2021-01-29 ENCOUNTER — Other Ambulatory Visit: Payer: Self-pay

## 2021-01-29 ENCOUNTER — Emergency Department (HOSPITAL_BASED_OUTPATIENT_CLINIC_OR_DEPARTMENT_OTHER)
Admission: EM | Admit: 2021-01-29 | Discharge: 2021-01-29 | Disposition: A | Discharge status: Home / Self Care | Attending: Emergency Medicine | Admitting: Emergency Medicine

## 2021-01-29 ENCOUNTER — Encounter (HOSPITAL_BASED_OUTPATIENT_CLINIC_OR_DEPARTMENT_OTHER): Payer: Self-pay | Admitting: *Deleted

## 2021-01-29 ENCOUNTER — Emergency Department (HOSPITAL_BASED_OUTPATIENT_CLINIC_OR_DEPARTMENT_OTHER)

## 2021-01-29 DIAGNOSIS — Z794 Long term (current) use of insulin: Secondary | ICD-10-CM | POA: Insufficient documentation

## 2021-01-29 DIAGNOSIS — Z7952 Long term (current) use of systemic steroids: Secondary | ICD-10-CM | POA: Insufficient documentation

## 2021-01-29 DIAGNOSIS — Z7984 Long term (current) use of oral hypoglycemic drugs: Secondary | ICD-10-CM | POA: Diagnosis not present

## 2021-01-29 DIAGNOSIS — R109 Unspecified abdominal pain: Secondary | ICD-10-CM | POA: Diagnosis present

## 2021-01-29 DIAGNOSIS — K625 Hemorrhage of anus and rectum: Secondary | ICD-10-CM | POA: Diagnosis not present

## 2021-01-29 DIAGNOSIS — E119 Type 2 diabetes mellitus without complications: Secondary | ICD-10-CM | POA: Insufficient documentation

## 2021-01-29 DIAGNOSIS — J45909 Unspecified asthma, uncomplicated: Secondary | ICD-10-CM | POA: Diagnosis not present

## 2021-01-29 DIAGNOSIS — K219 Gastro-esophageal reflux disease without esophagitis: Secondary | ICD-10-CM | POA: Insufficient documentation

## 2021-01-29 LAB — CBC
HCT: 40.8 % (ref 36.0–46.0)
Hemoglobin: 13.6 g/dL (ref 12.0–15.0)
MCH: 29.1 pg (ref 26.0–34.0)
MCHC: 33.3 g/dL (ref 30.0–36.0)
MCV: 87.2 fL (ref 80.0–100.0)
Platelets: 260 10*3/uL (ref 150–400)
RBC: 4.68 MIL/uL (ref 3.87–5.11)
RDW: 14.3 % (ref 11.5–15.5)
WBC: 10.4 10*3/uL (ref 4.0–10.5)
nRBC: 0 % (ref 0.0–0.2)

## 2021-01-29 LAB — COMPREHENSIVE METABOLIC PANEL
ALT: 38 U/L (ref 0–44)
AST: 28 U/L (ref 15–41)
Albumin: 4.4 g/dL (ref 3.5–5.0)
Alkaline Phosphatase: 75 U/L (ref 38–126)
Anion gap: 13 (ref 5–15)
BUN: 20 mg/dL (ref 6–20)
CO2: 25 mmol/L (ref 22–32)
Calcium: 9.9 mg/dL (ref 8.9–10.3)
Chloride: 98 mmol/L (ref 98–111)
Creatinine, Ser: 1.03 mg/dL — ABNORMAL HIGH (ref 0.44–1.00)
GFR, Estimated: >60 mL/min (ref >60)
Glucose, Bld: 117 mg/dL — ABNORMAL HIGH (ref 70–99)
Potassium: 3.8 mmol/L (ref 3.5–5.1)
Sodium: 136 mmol/L (ref 135–145)
Total Bilirubin: 0.6 mg/dL (ref 0.3–1.2)
Total Protein: 7.6 g/dL (ref 6.5–8.1)

## 2021-01-29 MED ORDER — ONDANSETRON HCL 4 MG/2ML IJ SOLN
4.0000 mg | Freq: Once | INTRAMUSCULAR | Status: AC
  Administered 2021-01-29: 4 mg via INTRAVENOUS
  Filled 2021-01-29: qty 2

## 2021-01-29 MED ORDER — ALUM & MAG HYDROXIDE-SIMETH 200-200-20 MG/5ML PO SUSP
30.0000 mL | Freq: Once | ORAL | Status: AC
  Administered 2021-01-29: 30 mL via ORAL
  Filled 2021-01-29: qty 30

## 2021-01-29 MED ORDER — SODIUM CHLORIDE 0.9 % IV BOLUS
1000.0000 mL | Freq: Once | INTRAVENOUS | Status: AC
  Administered 2021-01-29: 1000 mL via INTRAVENOUS

## 2021-01-29 MED ORDER — IOHEXOL 350 MG/ML SOLN
75.0000 mL | Freq: Once | INTRAVENOUS | Status: AC | PRN
  Administered 2021-01-29: 75 mL via INTRAVENOUS

## 2021-01-29 NOTE — ED Notes (Signed)
Instructed to remain NPO until further orders 

## 2021-01-29 NOTE — ED Provider Notes (Signed)
[facility] Provider Note  CSN: [id number] Arrival date & time: 01/29/21 0034    History Chief Complaint  Patient presents with   Abdominal Pain   Rectal Bleeding    Melissa [name] is a 55 y.o. female with history of GERD reports she has had 2 weeks of generalized abdominal pain, assocaited with nausea and diarrhea. No fevers. She was at work today when she had one episode of bloody diarrhea. She denies any history of same. Does not take blood thinners.    Past Medical History:  Diagnosis Date   Asthma    Complication of anesthesia    Diabetes mellitus without complication ([facility])    Type II   Dysrhythmia    Tachycardia   GERD (gastroesophageal reflux disease)    Pneumonia    2014- 'Walking"   Shortness of breath dyspnea    with exertion   Vertigo     Past Surgical History:  Procedure Laterality Date   CHOLECYSTECTOMY  1997   COLONOSCOPY  2015   CYSTOSCOPY     age 10- stretched stem of bladder   EYE SURGERY     Baby- Strabismus   SHOULDER ARTHROSCOPY Left 11/20/2014   Procedure: ARTHROSCOPY SHOULDER WITH MUA, ROTATOR INTERVAL RELEASE;  Surgeon: Meredith Pel, MD;  Location: [place];  Service: Orthopedics;  Laterality: Left;  LEFT SHOULDER MUA, DOA, ROTATOR INTERVAL RELEASE.   TONSILLECTOMY  2010    Family History  Adopted: Yes  Problem Relation Age of Onset   Other Other        adopted    Social History   Tobacco Use   Smoking status: Never   Smokeless tobacco: Never  Vaping Use   Vaping Use: Never used  Substance Use Topics   Alcohol use: Yes    Alcohol/week: 0.0 standard drinks   Drug use: No     Home Medications Prior to Admission medications   Medication Sig Start Date End Date Taking? Authorizing Provider  blood glucose meter kit and supplies KIT Dispense based on patient and insurance preference. Use 1x a day. (FOR ICD-9 250.00, 250.01). 04/07/18  Yes Philemon Kingdom, MD  Continuous Blood Gluc Receiver  (FREESTYLE LIBRE 14 DAY READER) DEVI Use with sensor to check blood sugar. 07/15/20  Yes Pleas Koch, NP  Continuous Blood Gluc Sensor (FREESTYLE LIBRE 14 DAY SENSOR) MISC Use as directed to check blood sugars. 07/15/20  Yes Pleas Koch, NP  Dulaglutide (TRULICITY) 3 JZ/7.9XT SOPN Inject 3 mg into the skin once a week. 08/12/20  Yes Philemon Kingdom, MD  glucose blood (FREESTYLE LITE) test strip Use as instructed, test blood sugar once a day 06/22/18  Yes Philemon Kingdom, MD  insulin glargine, 2 Unit Dial, (TOUJEO MAX SOLOSTAR) 300 UNIT/ML Solostar Pen Inject 28 Units into the skin at bedtime. For diabetes 08/12/20  Yes Philemon Kingdom, MD  Insulin Pen Needle 32G X 4 MM MISC Use 1x a day 07/16/20  Yes Pleas Koch, NP  Lancets MISC Use 1x a day 04/07/18  Yes Philemon Kingdom, MD  medroxyPROGESTERone (DEPO-PROVERA) 150 MG/ML injection Inject 150 mg into the muscle every 3 (three) months.   Yes [provider]  metFORMIN (GLUCOPHAGE) 1000 MG tablet Take 1 tablet (1,000 mg total) by mouth 2 (two) times daily with a meal. 07/15/20  Yes Pleas Koch, NP  nebivolol (BYSTOLIC) 10 MG tablet Take 1 tablet (10 mg total) by mouth daily. 07/15/20  Yes Pleas Koch, NP  ondansetron (ZOFRAN-ODT) 8 MG disintegrating tablet PLACE 1 TABLET ON THE TONGUE TWICE A DAY AS NEEDED FOR NAUSEA OR VOMITING 07/15/20  Yes Pleas Koch, NP  telmisartan (MICARDIS) 40 MG tablet Take 1 tablet (40 mg total) by mouth daily. For blood pressure. 07/15/20  Yes Pleas Koch, NP  venlafaxine XR (EFFEXOR-XR) 37.5 MG 24 hr capsule Take 37.5 mg by mouth daily. 11/18/20  Yes [provider]  albuterol (VENTOLIN HFA) 108 (90 Base) MCG/ACT inhaler Inhale 2 puffs into the lungs every 6 (six) hours as needed for wheezing or shortness of breath. Patient not taking: No sig reported 07/23/20   Pleas Koch, NP  buPROPion [facility] SR) 100 MG 12 hr tablet TAKE 1 TABLET(100 MG) BY MOUTH TWICE  DAILY Patient not taking: No sig reported 10/10/20   Pleas Koch, NP  fluticasone (FLOVENT HFA) 44 MCG/ACT inhaler Inhale 2 puffs into the lungs in the morning and at bedtime. Patient not taking: No sig reported 08/22/20   Lucretia Kern, DO     Allergies    Amoxicillin, Ciprofloxacin, Other, Penicillins, Sulfa antibiotics, Erythromycin, and Vicodin [hydrocodone-acetaminophen]   Review of Systems   Review of Systems A comprehensive review of systems was completed and negative except as noted in HPI.    Physical Exam BP 103/66 (BP Location: Right Arm)   Pulse 84   Temp 98.2 F (36.8 C) (Oral)   Resp 16   Ht 5' (1.524 m)   Wt 97.5 kg   SpO2 99%   BMI 41.99 kg/m   Physical Exam Vitals and nursing note reviewed.  Constitutional:      Appearance: Normal appearance.  HENT:     Head: Normocephalic and atraumatic.     Nose: Nose normal.     Mouth/Throat:     Mouth: Mucous membranes are moist.  Eyes:     Extraocular Movements: Extraocular movements intact.     Conjunctiva/sclera: Conjunctivae normal.  Cardiovascular:     Rate and Rhythm: Normal rate.  Pulmonary:     Effort: Pulmonary effort is normal.     Breath sounds: Normal breath sounds.  Abdominal:     General: Abdomen is flat.     Palpations: Abdomen is soft.     Tenderness: There is no abdominal tenderness. There is no guarding. Negative signs include Murphy's sign and McBurney's sign.  Musculoskeletal:        General: No swelling. Normal range of motion.     Cervical back: Neck supple.  Skin:    General: Skin is warm and dry.  Neurological:     General: No focal deficit present.     Mental Status: She is alert.  Psychiatric:        Mood and Affect: Mood normal.     ED Results / Procedures / Treatments   Labs (all labs ordered are listed, but only abnormal results are displayed) Labs Reviewed  COMPREHENSIVE METABOLIC PANEL - Abnormal; Notable for the following components:      Result Value    Glucose, Bld 117 (*)    Creatinine, Ser 1.03 (*)    All other components within normal limits  CBC  PREGNANCY, URINE  POC OCCULT BLOOD, ED    EKG None   Radiology CT Abdomen Pelvis W Contrast  Result Date: 01/29/2021 CLINICAL DATA:  Abdominal pain.  Rectal bleeding. EXAM: CT ABDOMEN AND PELVIS WITH CONTRAST TECHNIQUE: Multidetector CT imaging of the abdomen and pelvis was performed using the standard protocol following bolus  administration of intravenous contrast. CONTRAST:  39m OMNIPAQUE IOHEXOL 350 MG/ML SOLN COMPARISON:  CTA abdomen pelvis, 12/26/2019. FINDINGS: Lower chest: No acute abnormality. Hepatobiliary: No focal liver abnormality is seen. Status post cholecystectomy. No biliary dilatation. Pancreas: Unremarkable. No pancreatic ductal dilatation or surrounding inflammatory changes. Spleen: Normal in size without focal abnormality. Small inferior pole accessory spleen. Adrenals/Urinary Tract: Adrenal glands are unremarkable. Kidneys are normal, without renal calculi, focal lesion, or hydronephrosis. Bladder is unremarkable. Stomach/Bowel: Stomach is within normal limits. Nonobstructed small bowel. Appendix appears normal. Nondilated colon. Mild sigmoid diverticulosis (see key image). No active contrast extravasation. No evidence of bowel wall thickening or inflammatory changes. Vascular/Lymphatic: No significant vascular findings are present. No enlarged abdominal or pelvic lymph nodes. Reproductive: Uterus and bilateral adnexa are unremarkable. Other: No abdominal wall hernia or abnormality. No abdominopelvic ascites. Musculoskeletal: No acute or significant osseous findings. IMPRESSION: Mild sigmoid diverticulosis, without CT evidence of diverticulitis. Electronically Signed   By: JMichaelle BirksM.D.   On: 01/29/2021 11:03    Procedures Procedures  Medications Ordered in the ED Medications  sodium chloride 0.9 % bolus 1,000 mL (0 mLs Intravenous Stopped 01/29/21 1003)  ondansetron  (ZOFRAN) injection 4 mg (4 mg Intravenous Given 01/29/21 1000)  iohexol (OMNIPAQUE) 350 MG/ML injection 75 mL (75 mLs Intravenous Contrast Given 01/29/21 1040)  alum & mag hydroxide-simeth (MAALOX/MYLANTA) 200-200-20 MG/5ML suspension 30 mL (30 mLs Oral Given 01/29/21 1139)     MDM Rules/Calculators/A&P MDM Patient with diffuse abdominal pain, now with bloody diarrhea. Will check labs and send for CT to eval colitis.   ED Course  I have reviewed the triage vital signs and the nursing notes.  Pertinent labs & imaging results that were available during my care of the patient were reviewed by me and considered in my medical decision making (see chart for details).  Clinical Course as of 01/29/21 1141  Wed Jan 29, 2021  0906 CBC is normal.  [CS]  [id number] is normal.  [CS]  18338CT shows diverticulosis but no other concerning findings. No further rectal bleeding since arrival. Still having some 'GERD' symptoms. Will give a GI cocktail.  [CS]    Clinical Course User Index [CS] STruddie Hidden MD    Final Clinical Impression(s) / ED Diagnoses Final diagnoses:  Rectal bleeding    Rx / DC Orders ED Discharge Orders     None        STruddie Hidden MD 01/29/21 1141

## 2021-01-29 NOTE — ED Notes (Signed)
AVS provided to client, reviewed AVS, opportunity for questions provided, encouraged client to call her GI MD for possible follow up care.

## 2021-01-29 NOTE — ED Notes (Signed)
Complains of feeling of nausea, requesting med, ED MD aware and orders rec and implemented

## 2021-01-29 NOTE — ED Notes (Signed)
Per ED MD, orthostatic vs order discontinued

## 2021-01-29 NOTE — ED Notes (Signed)
Charted IV removal and discharge condition on wrong person

## 2021-01-29 NOTE — ED Notes (Signed)
States she is having a "burning" type sensation at GI area, orders rec and implemented

## 2021-01-29 NOTE — ED Triage Notes (Signed)
Pt developed abd pain with rectal bleeding at work this morning. No history of GI bleed.

## 2021-02-10 LAB — HM DIABETES EYE EXAM

## 2021-02-14 ENCOUNTER — Encounter: Payer: Self-pay | Admitting: Internal Medicine

## 2021-03-24 ENCOUNTER — Ambulatory Visit: Admitting: Internal Medicine

## 2021-04-03 ENCOUNTER — Encounter: Payer: Self-pay | Admitting: Internal Medicine

## 2021-04-03 ENCOUNTER — Other Ambulatory Visit: Payer: Self-pay

## 2021-04-03 ENCOUNTER — Ambulatory Visit (INDEPENDENT_AMBULATORY_CARE_PROVIDER_SITE_OTHER): Admitting: Internal Medicine

## 2021-04-03 VITALS — BP 128/72 | HR 89 | Ht 60.0 in | Wt 210.2 lb

## 2021-04-03 DIAGNOSIS — E1165 Type 2 diabetes mellitus with hyperglycemia: Secondary | ICD-10-CM | POA: Diagnosis not present

## 2021-04-03 DIAGNOSIS — Z6841 Body Mass Index (BMI) 40.0 and over, adult: Secondary | ICD-10-CM | POA: Diagnosis not present

## 2021-04-03 DIAGNOSIS — E785 Hyperlipidemia, unspecified: Secondary | ICD-10-CM

## 2021-04-03 LAB — POCT GLYCOSYLATED HEMOGLOBIN (HGB A1C): Hemoglobin A1C: 6 % — AB (ref 4.0–5.6)

## 2021-04-03 MED ORDER — TOUJEO MAX SOLOSTAR 300 UNIT/ML ~~LOC~~ SOPN: 16.0000 [IU] | PEN_INJECTOR | Freq: Every day | SUBCUTANEOUS | 3 refills | Status: DC

## 2021-04-03 MED ORDER — TRULICITY 4.5 MG/0.5ML ~~LOC~~ SOAJ: 4.5000 mg | SUBCUTANEOUS | 3 refills | Status: DC

## 2021-04-03 MED ORDER — METFORMIN HCL ER 500 MG PO TB24: 1000.0000 mg | ORAL_TABLET | Freq: Two times a day (BID) | ORAL | 3 refills | Status: DC

## 2021-04-03 MED ORDER — INSULIN PEN NEEDLE 32G X 4 MM MISC: 3 refills | Status: DC

## 2021-04-03 NOTE — Progress Notes (Signed)
Patient ID: Melissa [name], female   DOB: [birth year], 55 y.o.   MRN: [id number]   This visit occurred during the SARS-CoV-2 public health emergency.  Safety protocols were in place, including screening questions prior to the visit, additional usage of staff PPE, and extensive cleaning of exam room while observing appropriate contact time as indicated for disinfecting solutions.   HPI: Melissa [name] is a 55 y.o.-year-old female, returning for f/u for DM2, dx in 1997, insulin-dependent, uncontrolled, without long-term complications. Last OV 4.5 months ago.  Interim history: No increased urination, blurry vision, chest pain. Has some nausea - has Zofran at hand.  She does not feel that her nausea is related to Trulicity. She was in the emergency room with rectal bleeding 01/29/2021.  She had a CT scan of the abdomen which showed diverticulosis, but without diverticulitis. Will have colonoscopy in 05/2021. She was able to lose a significant amount of weight since last visit - 32 lbs!!! - back on Herbalife shakes.  Reviewed HbA1c levels: Lab Results  Component Value Date   HGBA1C 7.3 (A) 11/18/2020   HGBA1C 9.5 Repeated and verified X2. (H) 07/15/2020   HGBA1C 7.6 (A) 01/16/2020  1997: HbA1c 11%  She is on: - Metformin 1000 mg 2x a day with meals  - Trulicity 1.5 >> 3 mg weekly  - Toujeo  28 >> 34 >> 24 units daily - Previously on JanuMet.  Pt checks sugars 4x a day - with the freestyle libre CGM:   Previously:   Previously:  Lowest sugar was 50s in the past >>...  65 >> 58 (by CGM); she has hypoglycemia awareness in the 70s. Highest sugar was 300s >> 300x 1-2 >> 180.  Glucometer: RelioN >> Freestyle lite  -No CKD; last BUN/creatinine:  Lab Results  Component Value Date   BUN 20 01/29/2021   BUN 8 07/15/2020   CREATININE 1.03 (H) 01/29/2021   CREATININE 0.71 07/15/2020  On telmisartan 40.  -+ HL; last set of lipids: Lab Results  Component Value Date   CHOL 165 07/15/2020   HDL  46.20 07/15/2020   LDLCALC 79 07/15/2020   LDLDIRECT 125.0 06/08/2019   TRIG 199.0 (H) 07/15/2020   CHOLHDL 4 07/15/2020  Not on a statin, as she could not tolerate statins and Zetia in the past.  She has fatty liver and a history of transaminitis, But latest LFTs were normal: Lab Results  Component Value Date   ALT 38 01/29/2021   AST 28 01/29/2021   ALKPHOS 75 01/29/2021   BILITOT 0.6 01/29/2021   - last eye exam was in 08/2020: No DR reportedly.  - no numbness and tingling in her feet.  Pt is adopted - ? FH of DM.  She had COVID-19 in 06/2020.  She had prednisone at that time and sugars increased to the 300s. She lost 55 lbs in 2019 (Herbalife), then gained back 40 pounds. She relates an allergy to almost all antibiotics, but she can take Levaquin, Z-Pack, Avelox, doxycycline.  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  ROS: + See HPI  I reviewed pt's medications, allergies, PMH, social hx, family hx, and changes were documented in the history of present illness. Otherwise, unchanged from my initial visit note.  Past Medical History:  Diagnosis Date   Asthma    Complication of anesthesia    Diabetes mellitus without complication (HCC)    Type II   Dysrhythmia    Tachycardia   GERD (gastroesophageal reflux disease)  Pneumonia    2014- 'Walking"   Shortness of breath dyspnea    with exertion   Vertigo    Past Surgical History:  Procedure Laterality Date   CHOLECYSTECTOMY  1997   COLONOSCOPY  2015   CYSTOSCOPY     age 80- stretched stem of bladder   EYE SURGERY     Baby- Strabismus   SHOULDER ARTHROSCOPY Left 11/20/2014   Procedure: ARTHROSCOPY SHOULDER WITH MUA, ROTATOR INTERVAL RELEASE;  Surgeon: Meredith Pel, MD;  Location: [place];  Service: Orthopedics;  Laterality: Left;  LEFT SHOULDER MUA, DOA, ROTATOR INTERVAL RELEASE.   TONSILLECTOMY  2010   Social History   Socioeconomic History   Marital status: Married    Spouse name: Not on file   Number of  children: 1   Years of education: Not on file   Highest education level: Not on file  Occupational History     Tobacco Use   Smoking status: Never Smoker   Smokeless tobacco: Never Used  Substance and Sexual Activity   Alcohol use: Yes    Alcohol/week: 0.0 oz    Comment: rare   Drug use: No      Social History Narrative   Married.   1 daughter.   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    Current Outpatient Medications on File Prior to Visit  Medication Sig Dispense Refill   albuterol (VENTOLIN HFA) 108 (90 Base) MCG/ACT inhaler Inhale 2 puffs into the lungs every 6 (six) hours as needed for wheezing or shortness of breath. (Patient not taking: No sig reported) 1 each 0   blood glucose meter kit and supplies KIT Dispense based on patient and insurance preference. Use 1x a day. (FOR ICD-9 250.00, 250.01). 1 each 0   buPROPion (WELLBUTRIN SR) 100 MG 12 hr tablet TAKE 1 TABLET(100 MG) BY MOUTH TWICE DAILY (Patient not taking: No sig reported) 180 tablet 2   Continuous Blood Gluc Receiver (FREESTYLE LIBRE 14 DAY READER) DEVI Use with sensor to check blood sugar. 1 each 0   Continuous Blood Gluc Sensor (FREESTYLE LIBRE 14 DAY SENSOR) MISC Use as directed to check blood sugars. 6 each 1   Dulaglutide (TRULICITY) 3 PZ/0.2HE SOPN Inject 3 mg into the skin once a week. 6 mL 3   fluticasone (FLOVENT HFA) 44 MCG/ACT inhaler Inhale 2 puffs into the lungs in the morning and at bedtime. (Patient not taking: No sig reported) 1 each 12   glucose blood (FREESTYLE LITE) test strip Use as instructed, test blood sugar once a day 100 each 12   insulin glargine, 2 Unit Dial, (TOUJEO MAX SOLOSTAR) 300 UNIT/ML Solostar Pen Inject 28 Units into the skin at bedtime. For diabetes 30 mL 3   Insulin Pen Needle 32G X 4 MM MISC Use 1x a day 100 each 0   Lancets MISC Use 1x a day 100 each 3   medroxyPROGESTERone (DEPO-PROVERA) 150 MG/ML injection Inject 150 mg into the muscle every 3  (three) months.     metFORMIN (GLUCOPHAGE) 1000 MG tablet Take 1 tablet (1,000 mg total) by mouth 2 (two) times daily with a meal. 180 tablet 3   nebivolol (BYSTOLIC) 10 MG tablet Take 1 tablet (10 mg total) by mouth daily. 90 tablet 3   ondansetron (ZOFRAN-ODT) 8 MG disintegrating tablet PLACE 1 TABLET ON THE TONGUE TWICE A DAY AS NEEDED FOR NAUSEA OR VOMITING 20 tablet 0   telmisartan (MICARDIS) 40 MG tablet  Take 1 tablet (40 mg total) by mouth daily. For blood pressure. 90 tablet 3   venlafaxine XR (EFFEXOR-XR) 37.5 MG 24 hr capsule Take 37.5 mg by mouth daily.     No current facility-administered medications on file prior to visit.   Allergies  Allergen Reactions   Amoxicillin Anaphylaxis and Other (See Comments)    Has patient had a PCN reaction causing immediate rash, facial/tongue/throat swelling, SOB or lightheadedness with hypotension: Yes Has patient had a PCN reaction causing severe rash involving mucus membranes or skin necrosis: No Has patient had a PCN reaction that required hospitalization No Has patient had a PCN reaction occurring within the last 10 years: No If all of the above answers are "NO", then may proceed with Cephalosporin use.   Ciprofloxacin Anaphylaxis   Other Anaphylaxis    Pt states that she can only take Avelox, and Zithromax.     Penicillins Anaphylaxis and Other (See Comments)    Has patient had a PCN reaction causing immediate rash, facial/tongue/throat swelling, SOB or lightheadedness with hypotension: Yes Has patient had a PCN reaction causing severe rash involving mucus membranes or skin necrosis: No Has patient had a PCN reaction that required hospitalization No Has patient had a PCN reaction occurring within the last 10 years: No If all of the above answers are "NO", then may proceed with Cephalosporin use.   Sulfa Antibiotics Anaphylaxis   Erythromycin Other (See Comments)    GI upset with oral regimen over 10 years ago   Vicodin  [Hydrocodone-Acetaminophen] Itching   Family History  Adopted: Yes  Problem Relation Age of Onset   Other Other        adopted    PE: BP 128/72 (BP Location: Right Arm, Patient Position: Sitting, Cuff Size: Normal)   Pulse 89   Ht 5' (1.524 m)   Wt 210 lb 3.2 oz (95.3 kg)   SpO2 98%   BMI 41.05 kg/m  Wt Readings from Last 3 Encounters:  04/03/21 210 lb 3.2 oz (95.3 kg)  01/29/21 215 lb (97.5 kg)  11/18/20 242 lb 6.4 oz (110 kg)   Constitutional: overweight, in NAD Eyes: PERRLA, EOMI, no exophthalmos ENT: moist mucous membranes, no thyromegaly, no cervical lymphadenopathy Cardiovascular: tachycardia, RR, No MRG Respiratory: CTA B Gastrointestinal: abdomen soft, NT, ND, BS+ Musculoskeletal: no deformities, strength intact in all 4 Skin: moist, warm, no rashes Neurological: no tremor with outstretched hands, DTR normal in all 4  ASSESSMENT: 1. DM2, insulin-dependent, uncontrolled, without long-term complications, but with hyperglycemia  Investigation for type 1 diabetes was negative: Component     Latest Ref Rng & Units 08/01/2018  C-Peptide     0.80 - 3.85 ng/mL 2.53  Islet Cell Ab     Neg:<1:1 Negative  Glucose, Plasma     65 - 99 mg/dL 100 (H)  ZNT8 Antibodies     U/mL <15  Glutamic Acid Decarb Ab     <5 IU/mL <5   2.  Hyperlipidemia  3.  Obesity class III  PLAN:  1. Patient with longstanding, uncontrolled, type 2 diabetes, on oral antidiabetic regimen, with metformin, and also long-acting insulin and weekly GLP-1 receptor agonist.  At last visit HbA1c improved to 7.3%.  However, based on her CGM downloads, the predicted HbA1c was 6.8%. -At last visit, reviewing her CGM trends, sugars were almost entirely fluctuating within the normal range, with slightly more variability overnight but still controlled blood sugars.  She has slight increases in blood sugars after this treatment  meals, but not above 180.  At that time, we discussed about possibly increasing  Trulicity from 3 to 4.5 mg weekly, but I advised her to let me know if she ran out of her supply to do so. CGM interpretation: -At today's visit, we reviewed her CGM downloads: It appears that percent of values are in target range (goal >70%), while 0% are higher than 180 (goal <25%), and 0% are lower than 70 (goal <4%).  The calculated average blood sugar is 113. -Reviewing the CGM trends, almost all of her blood sugars appear to be within the target range, without hyper or hypoglycemia.  She did have low blood sugars in the 50s and 60s overnight 4 days ago, however, she felt that these were erroneous since she did not feel any different.  We discussed about factors that can influence the accuracy of the CGM readings including position of the arm (for example sleeping on the sensor), vitamin C and aspirin.   -For now, I did advise her to decrease the dose of Toujeo (she already did decrease this since last visit.  In the meantime, we will try to increase Trulicity to help more with weight loss but also with diabetes control.  Since she describes chronic nausea (she does not feel that this is coming from Trulicity), I did suggest a change from regular metformin to metformin ER.  She agrees with this. -We do have loss of data between approximately 11 AM to 5 PM and we discussed about trying to scan the device frequently, although this is difficult when she is at work.  We discussed about the new freestyle libre 3 which does not require scanning and we will send this for her as soon as it becomes available at her supplier.  -At today's visit, - I suggested to:  Patient Instructions  Please switch to: - Metformin ER 1000 mg 2x a day with meals  Please decrease: - Toujeo 16 units daily at bedtime   Increase: - Trulicity 4.5 mg weekly  Please come back for a follow-up appointment in 4 months.  - we checked her HbA1c: 6.0% (much better) - advised to check sugars at different times of the day - 4x a  day, rotating check times - advised for yearly eye exams >> she is UTD - return to clinic in 4 months  2.  Hyperlipidemia -Reviewed latest lipid panel from 06/2020: Triglycerides high, but LDL at goal: Lab Results  Component Value Date   CHOL 165 07/15/2020   HDL 46.20 07/15/2020   LDLCALC 79 07/15/2020   LDLDIRECT 125.0 06/08/2019   TRIG 199.0 (H) 07/15/2020   CHOLHDL 4 07/15/2020  -She is not on a statin.  We tried Zetia but she developed mental fog and joint aches.  She does see cardiology.  3.  Obesity class III -We will continue Trulicity which should also help with weight loss. -She previously lost a significant amount of weight on Herbalife shakes, now off. -Before last visit, she gained 8 pounds, which she attributed partly to menopause -We discussed at last visit about increasing her Trulicity dose from 3 mg to 4.5 mg weekly after she ran out of her lower dose.  She is now almost out of the dose so I sent a higher dose to the pharmacy -She lost 32 pounds since last visit!!!!  Philemon Kingdom, MD PhD [facility] Endocrinology

## 2021-04-03 NOTE — Patient Instructions (Addendum)
Please switch to: - Metformin ER 1000 mg 2x a day with meals  Please decrease: - Toujeo 16 units daily at bedtime   Increase: - Trulicity 4.5 mg weekly  Please come back for a follow-up appointment in 4 months.

## 2021-08-04 ENCOUNTER — Ambulatory Visit: Admitting: Internal Medicine

## 2021-09-15 ENCOUNTER — Encounter: Payer: Self-pay | Admitting: Internal Medicine

## 2021-09-15 ENCOUNTER — Ambulatory Visit (INDEPENDENT_AMBULATORY_CARE_PROVIDER_SITE_OTHER): Admitting: Internal Medicine

## 2021-09-15 VITALS — BP 128/74 | HR 89 | Ht 60.0 in | Wt 210.2 lb

## 2021-09-15 DIAGNOSIS — E1165 Type 2 diabetes mellitus with hyperglycemia: Secondary | ICD-10-CM | POA: Diagnosis not present

## 2021-09-15 DIAGNOSIS — E66813 Obesity, class 3: Secondary | ICD-10-CM

## 2021-09-15 DIAGNOSIS — E785 Hyperlipidemia, unspecified: Secondary | ICD-10-CM | POA: Diagnosis not present

## 2021-09-15 DIAGNOSIS — Z6841 Body Mass Index (BMI) 40.0 and over, adult: Secondary | ICD-10-CM | POA: Diagnosis not present

## 2021-09-15 LAB — LIPID PANEL
Cholesterol: 178 mg/dL (ref 0–200)
HDL: 44.6 mg/dL (ref >39.00)
LDL Cholesterol: 101 mg/dL — ABNORMAL HIGH (ref 0–99)
NonHDL: 132.97
Total CHOL/HDL Ratio: 4
Triglycerides: 158 mg/dL — ABNORMAL HIGH (ref 0.0–149.0)
VLDL: 31.6 mg/dL (ref 0.0–40.0)

## 2021-09-15 LAB — POCT GLYCOSYLATED HEMOGLOBIN (HGB A1C): Hemoglobin A1C: 6 % — AB (ref 4.0–5.6)

## 2021-09-15 LAB — MICROALBUMIN / CREATININE URINE RATIO
Creatinine,U: 499.7 mg/dL
Microalb Creat Ratio: 1.3 mg/g (ref 0.0–30.0)
Microalb, Ur: 6.6 mg/dL — ABNORMAL HIGH (ref 0.0–1.9)

## 2021-09-15 NOTE — Progress Notes (Signed)
Patient ID: Melissa [name], female   DOB: [birth year], 56 y.o.   MRN: [id number]  ? ?This visit occurred during the SARS-CoV-2 public health emergency.  Safety protocols were in place, including screening questions prior to the visit, additional usage of staff PPE, and extensive cleaning of exam room while observing appropriate contact time as indicated for disinfecting solutions.  ? ?HPI: ?Melissa [name] is a 56 y.o.-year-old female, returning for f/u for DM2, dx in 1997, insulin-dependent, uncontrolled, without long-term complications. Last OV 5 months ago. ? ?Interim history: ?No increased urination, blurry vision, chest pain.  At last visit she had some nausea but she did not feel that this was related to Trulicity. Since we switched the Metformin to ER formulation >> much less nausea. ?Before last visit, she was able to lose a significant amount of weight since last visit - 32 lbs!!! - back on Herbalife shakes. ?She had a R steroid inj >> high CBG 380. She now gets them 1x a year. ? ?Reviewed HbA1c levels: ?Lab Results  ?Component Value Date  ? HGBA1C 6.0 (A) 04/03/2021  ? HGBA1C 7.3 (A) 11/18/2020  ? HGBA1C 9.5 Repeated and verified X2. (H) 07/15/2020  ?1997: HbA1c 11% ? ?She is on: ?- Metformin >> Metformin ER  1000 mg 2x a day with meals  ?- Trulicity 1.5 >> 3 >> 4.5 mg weekly  ?- Toujeo  28 >> 34 >> 24 >> 16 units daily ?- Previously on JanuMet. ? ?Pt checks sugars 4x a day - with the freestyle libre CGM: ? ? ?Prev.: ? ? ?Previously: ? ? ?Lowest sugar was 50s in the past >>...  65 >> 58 (by CGM) >> 81 >> 53 (but with glucometer: 80s); she has hypoglycemia awareness in the 70s. ?Highest sugar was 300s >> 300x 1-2 >> 180 >> 380 (steroids). ? ?Glucometer: RelioN >> Freestyle lite ? ?-No CKD; last BUN/creatinine:  ?Lab Results  ?Component Value Date  ? BUN 20 01/29/2021  ? BUN 8 07/15/2020  ? CREATININE 1.03 (H) 01/29/2021  ? CREATININE 0.71 07/15/2020  ?On telmisartan 40. ? ?-+ HL; last set of lipids: ?Lab Results   ?Component Value Date  ? CHOL 165 07/15/2020  ? HDL 46.20 07/15/2020  ? [place] 79 07/15/2020  ? LDLDIRECT 125.0 06/08/2019  ? TRIG 199.0 (H) 07/15/2020  ? CHOLHDL 4 07/15/2020  ?She could not tolerate statins and Zetia in the past. ? ?She has fatty liver and a history of transaminitis, But latest LFTs were normal: ?Lab Results  ?Component Value Date  ? ALT 38 01/29/2021  ? AST 28 01/29/2021  ? ALKPHOS 75 01/29/2021  ? BILITOT 0.6 01/29/2021  ? ?- last eye exam was in 01/2021: No DR. ? ?- no numbness and tingling in her feet.  ? ?Pt is adopted - ? FH of DM. ? ?She had COVID-19 in 06/2020.  She had prednisone at that time and sugars increased to the 300s. ?She lost 55 lbs in 2019 (Herbalife), then gained back 40 pounds. ?She relates an allergy to almost all antibiotics, but she can take Levaquin, Z-Pack, Avelox, doxycycline. ?She was in the emergency room with rectal bleeding 01/29/2021.  She had a CT scan of the abdomen which showed diverticulosis, but without diverticulitis.  ? 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 ? ?ROS: ?+ See HPI ? ?I reviewed pt's medications, allergies, PMH, social hx, family hx, and changes were documented in the history of present illness. Otherwise, unchanged from my initial visit note. ? ?Past  Medical History:  ?Diagnosis Date  ? Asthma   ? Complication of anesthesia   ? Diabetes mellitus without complication ([place])   ? Type II  ? Dysrhythmia   ? Tachycardia  ? GERD (gastroesophageal reflux disease)   ? Pneumonia   ? 2014- 'Walking"  ? Shortness of breath dyspnea   ? with exertion  ? Vertigo   ? ?Past Surgical History:  ?Procedure Laterality Date  ? CHOLECYSTECTOMY  1997  ? COLONOSCOPY  2015  ? CYSTOSCOPY    ? age 55- stretched stem of bladder  ? EYE SURGERY    ? Baby- Strabismus  ? SHOULDER ARTHROSCOPY Left 11/20/2014  ? Procedure: ARTHROSCOPY SHOULDER WITH MUA, ROTATOR INTERVAL RELEASE;  Surgeon: Meredith Pel, MD;  Location: [place];  Service: Orthopedics;  Laterality: Left;  LEFT  SHOULDER MUA, DOA, ROTATOR INTERVAL RELEASE.  ? TONSILLECTOMY  2010  ? ?Social History  ? ?Socioeconomic History  ? Marital status: Married  ?  Spouse name: Not on file  ? Number of children: 1  ? Years of education: Not on file  ? Highest education level: Not on file  ?Occupational History  ?   ?Tobacco Use  ? Smoking status: Never Smoker  ? Smokeless tobacco: Never Used  ?Substance and Sexual Activity  ? Alcohol use: Yes  ?  Alcohol/week: 0.0 oz  ?  Comment: rare  ? Drug use: No  ?  ?  ?Social History Narrative  ? Married.  ? 1 daughter.  ? 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  ? 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   ? ?Current Outpatient Medications on File Prior to Visit  ?Medication Sig Dispense Refill  ? blood glucose meter kit and supplies KIT Dispense based on patient and insurance preference. Use 1x a day. (FOR ICD-9 250.00, 250.01). 1 each 0  ? Dulaglutide (TRULICITY) 4.5 RA/0.7MA SOPN Inject 4.5 mg as directed once a week. 6 mL 3  ? glucose blood (FREESTYLE LITE) test strip Use as instructed, test blood sugar once a day 100 each 12  ? insulin glargine, 2 Unit Dial, (TOUJEO MAX SOLOSTAR) 300 UNIT/ML Solostar Pen Inject 16 Units into the skin at bedtime. For diabetes 9 mL 3  ? Insulin Pen Needle 32G X 4 MM MISC Use 1x a day 100 each 3  ? Lancets MISC Use 1x a day 100 each 3  ? medroxyPROGESTERone (DEPO-PROVERA) 150 MG/ML injection Inject 150 mg into the muscle every 3 (three) months.    ? metFORMIN (GLUCOPHAGE-XR) 500 MG 24 hr tablet Take 2 tablets (1,000 mg total) by mouth 2 (two) times daily with a meal. 360 tablet 3  ? nebivolol (BYSTOLIC) 10 MG tablet Take 1 tablet (10 mg total) by mouth daily. 90 tablet 3  ? ondansetron (ZOFRAN-ODT) 8 MG disintegrating tablet PLACE 1 TABLET ON THE TONGUE TWICE A DAY AS NEEDED FOR NAUSEA OR VOMITING 20 tablet 0  ? telmisartan (MICARDIS) 40 MG tablet Take 1 tablet (40 mg total) by mouth daily. For blood pressure. 90 tablet 3  ? venlafaxine XR (EFFEXOR-XR) 37.5 MG 24 hr capsule  Take 37.5 mg by mouth daily.    ? ?No current facility-administered medications on file prior to visit.  ? ?Allergies  ?Allergen Reactions  ? Amoxicillin Anaphylaxis and Other (See Comments)  ?  Has patient had a PCN reaction causing immediate rash, facial/tongue/throat swelling, SOB or lightheadedness with hypotension: Yes ?Has patient had a PCN reaction causing severe rash involving mucus membranes or skin necrosis:  No ?Has patient had a PCN reaction that required hospitalization No ?Has patient had a PCN reaction occurring within the last 10 years: No ?If all of the above answers are "NO", then may proceed with Cephalosporin use.  ? Ciprofloxacin Anaphylaxis  ? Other Anaphylaxis  ?  Pt states that she can only take Avelox, and Zithromax.    ? Penicillins Anaphylaxis and Other (See Comments)  ?  Has patient had a PCN reaction causing immediate rash, facial/tongue/throat swelling, SOB or lightheadedness with hypotension: Yes ?Has patient had a PCN reaction causing severe rash involving mucus membranes or skin necrosis: No ?Has patient had a PCN reaction that required hospitalization No ?Has patient had a PCN reaction occurring within the last 10 years: No ?If all of the above answers are "NO", then may proceed with Cephalosporin use.  ? Sulfa Antibiotics Anaphylaxis  ? Erythromycin Other (See Comments)  ?  GI upset with oral regimen over 10 years ago  ? Vicodin [Hydrocodone-Acetaminophen] Itching  ? ?Family History  ?Adopted: Yes  ?Problem Relation Age of Onset  ? Other Other   ?     adopted  ? ? ?PE: ?BP 128/74 (BP Location: Left Arm, Patient Position: Sitting, Cuff Size: Normal)   Pulse 89   Ht 5' (1.524 m)   Wt 210 lb 3.2 oz (95.3 kg)   SpO2 99%   BMI 41.05 kg/m?  ?Wt Readings from Last 3 Encounters:  ?09/15/21 210 lb 3.2 oz (95.3 kg)  ?04/03/21 210 lb 3.2 oz (95.3 kg)  ?01/29/21 215 lb (97.5 kg)  ? ?Constitutional: overweight, in NAD ?Eyes: PERRLA, EOMI, no exophthalmos ?ENT: moist mucous membranes, no  thyromegaly, no cervical lymphadenopathy ?Cardiovascular: tachycardia, RR, No MRG ?Respiratory: CTA B ?Musculoskeletal: no deformities, strength intact in all 4 ?Skin: moist, warm, no rashes ?Neurological: no tremor wi

## 2021-09-15 NOTE — Patient Instructions (Signed)
Please continue: ?- Metformin ER 1000 mg 2x a day with meals ?- Toujeo 16 units daily at bedtime  ?- Trulicity 4.5 mg weekly ? ?Please come back for a follow-up appointment in 4 months. ?

## 2021-09-16 LAB — COMPLETE METABOLIC PANEL WITH GFR
AG Ratio: 1.7 (calc) (ref 1.0–2.5)
ALT: 27 U/L (ref 6–29)
AST: 25 U/L (ref 10–35)
Albumin: 4.4 g/dL (ref 3.6–5.1)
Alkaline phosphatase (APISO): 67 U/L (ref 37–153)
BUN: 13 mg/dL (ref 7–25)
CO2: 23 mmol/L (ref 20–32)
Calcium: 9.5 mg/dL (ref 8.6–10.4)
Chloride: 106 mmol/L (ref 98–110)
Creat: 0.94 mg/dL (ref 0.50–1.03)
Globulin: 2.6 g/dL (calc) (ref 1.9–3.7)
Glucose, Bld: 129 mg/dL — ABNORMAL HIGH (ref 65–99)
Potassium: 4.3 mmol/L (ref 3.5–5.3)
Sodium: 140 mmol/L (ref 135–146)
Total Bilirubin: 0.4 mg/dL (ref 0.2–1.2)
Total Protein: 7 g/dL (ref 6.1–8.1)
eGFR: 73 mL/min/{1.73_m2} (ref >=60)

## 2021-10-08 ENCOUNTER — Encounter: Payer: Self-pay | Admitting: Internal Medicine

## 2021-10-08 DIAGNOSIS — E1165 Type 2 diabetes mellitus with hyperglycemia: Secondary | ICD-10-CM

## 2021-10-10 MED ORDER — TIRZEPATIDE 7.5 MG/0.5ML ~~LOC~~ SOAJ: 7.5000 mg | SUBCUTANEOUS | 1 refills | Status: DC

## 2021-10-16 ENCOUNTER — Other Ambulatory Visit (HOSPITAL_COMMUNITY): Payer: Self-pay

## 2021-10-16 ENCOUNTER — Telehealth: Payer: Self-pay | Admitting: Pharmacy Technician

## 2021-10-16 DIAGNOSIS — E1165 Type 2 diabetes mellitus with hyperglycemia: Secondary | ICD-10-CM

## 2021-10-16 MED ORDER — TIRZEPATIDE 5 MG/0.5ML ~~LOC~~ SOAJ
5.0000 mg | SUBCUTANEOUS | 3 refills | Status: DC
Start: 2021-10-16 — End: 2022-04-20

## 2021-10-20 ENCOUNTER — Telehealth: Payer: Self-pay

## 2021-10-20 NOTE — Telephone Encounter (Signed)
Pt called to advise she would be using [employer] for her CGM (G7) supplies. Advised pt we would start online request through [facility] for supplies.  ?

## 2021-10-27 IMAGING — RF DG UGI W SINGLE CM
1 series · 15 of 15 positions shown · non-contrast
Comparison: None.

CLINICAL DATA: Morbid obesity. Gastroesophageal reflux disease.
Pre-op evaluation for bariatric surgery.

EXAM:
UPPER GI SERIES WITH KUB
TECHNIQUE: After obtaining a scout radiograph a routine upper GI series was
performed using thin barium.
FLUOROSCOPY TIME:  Fluoroscopy Time:  2 minutes 42 seconds
Radiation Exposure Index (if provided by the fluoroscopic device):
805 mGy
Number of Acquired Spot Images: 0

[Series 1: one shot · 0.14mm/px · 15 of 15 slices shown]
[im 1/15]
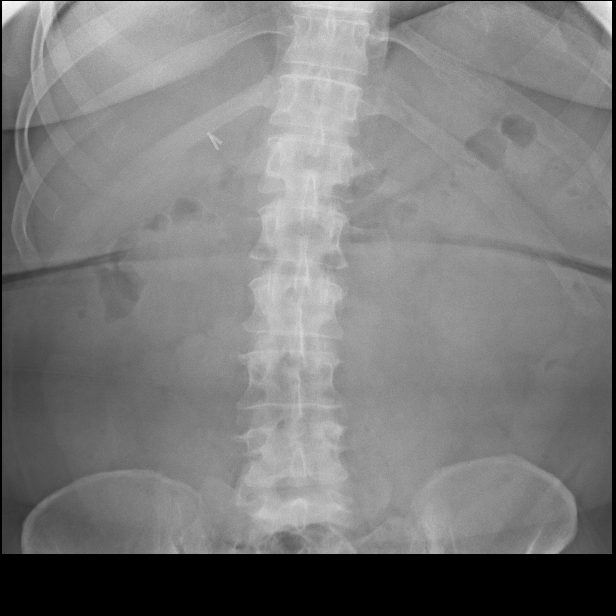
[im 2/15]
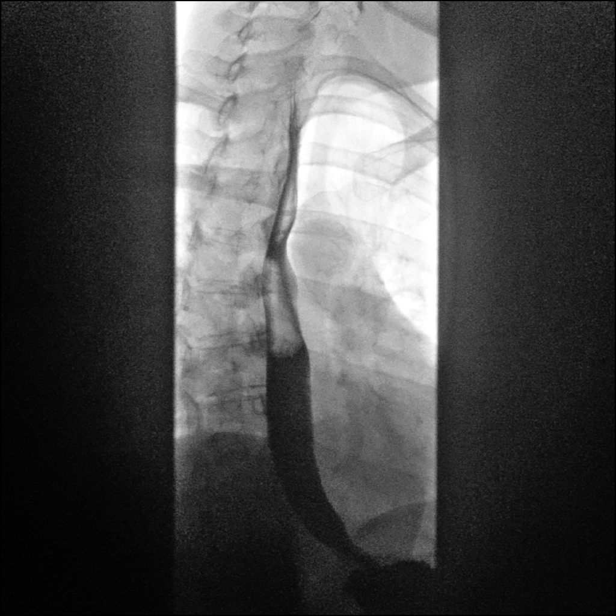
[im 3/15]
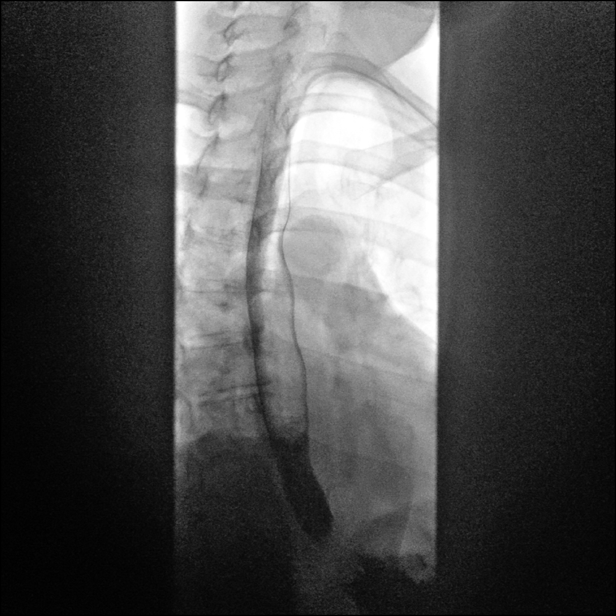
[im 4/15]
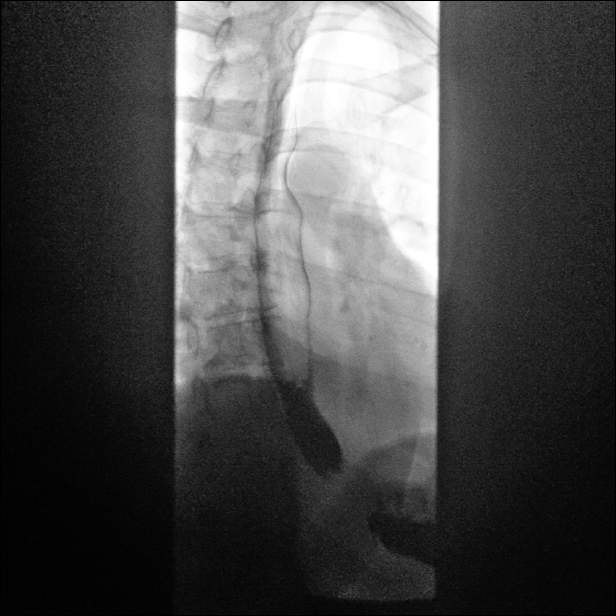
[im 5/15]
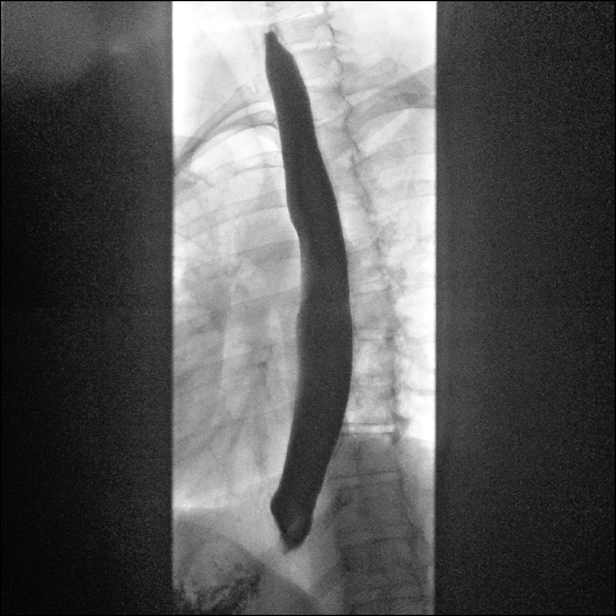
[im 6/15]
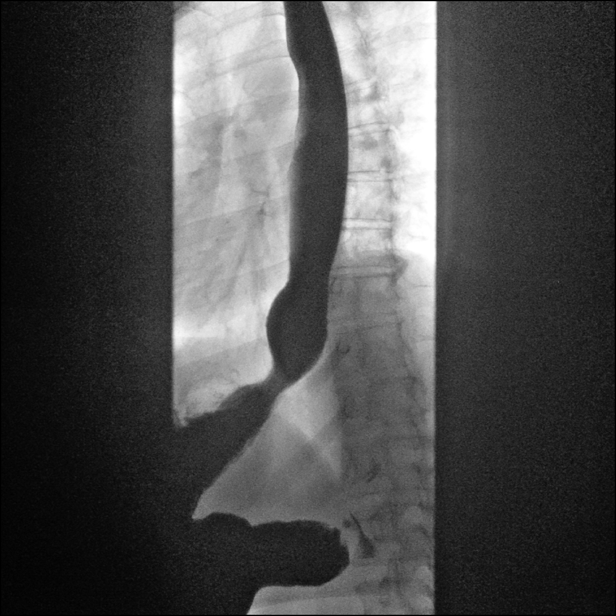
[im 7/15]
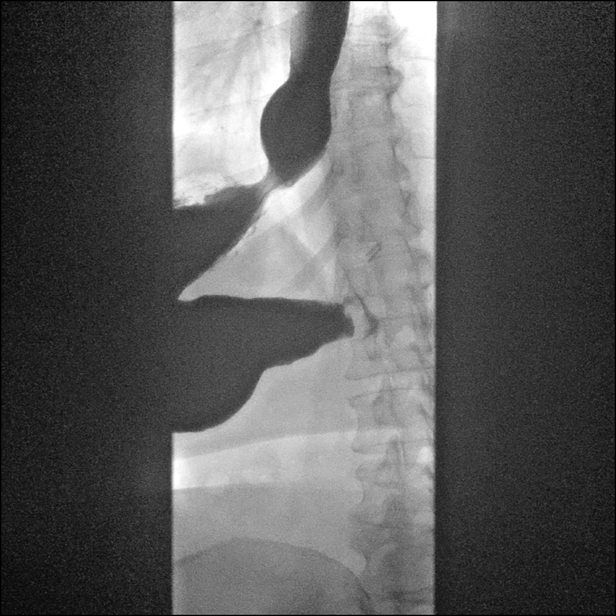
[im 8/15]
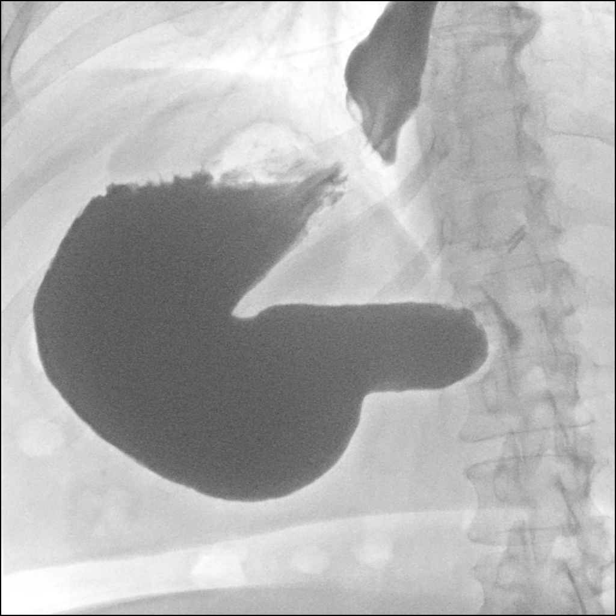
[im 9/15]
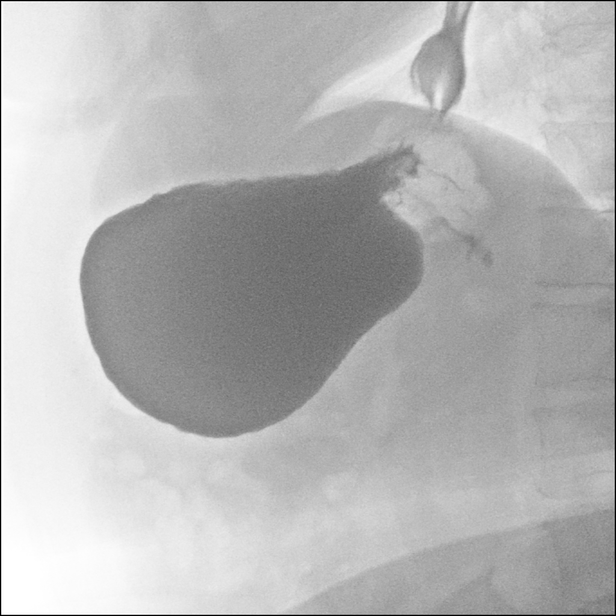
[im 10/15]
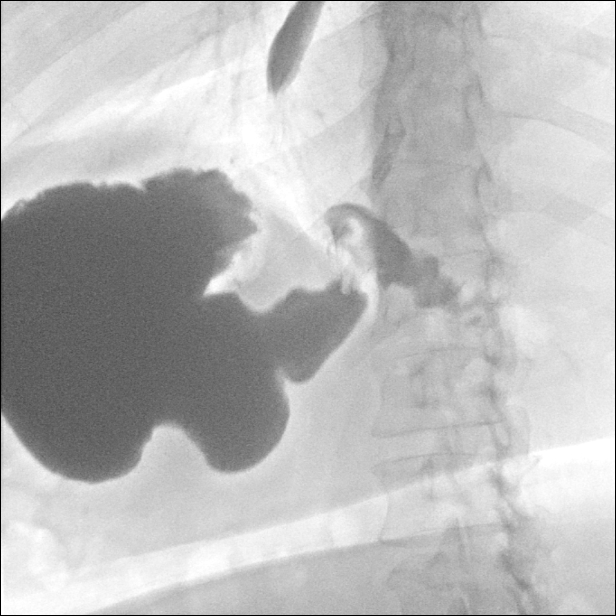
[im 11/15]
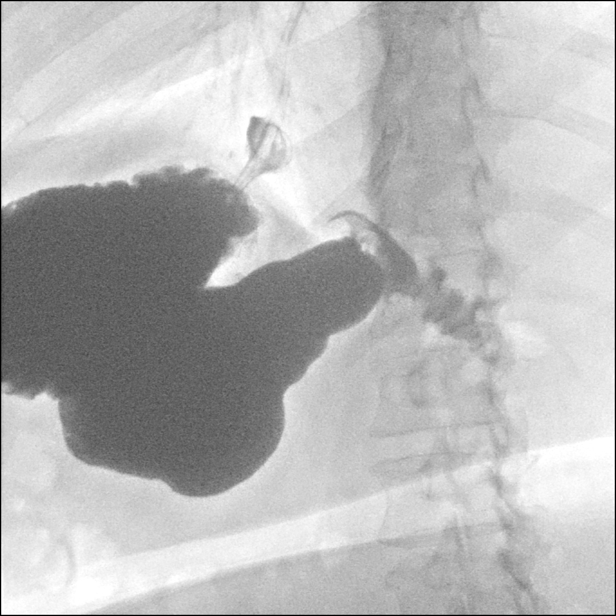
[im 12/15]
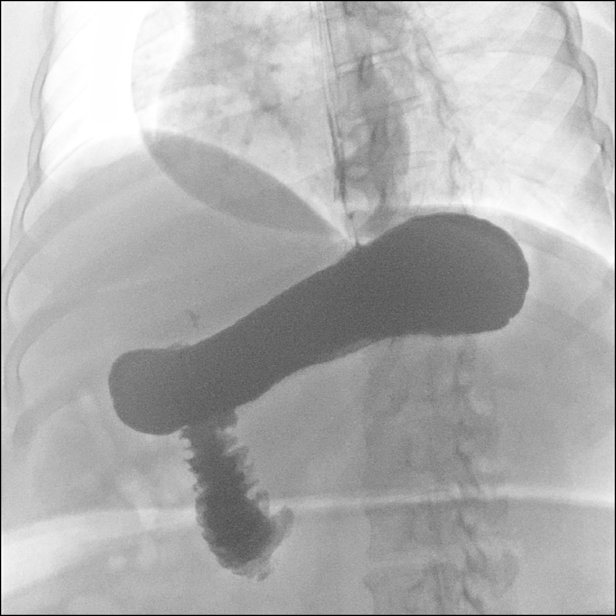
[im 13/15]
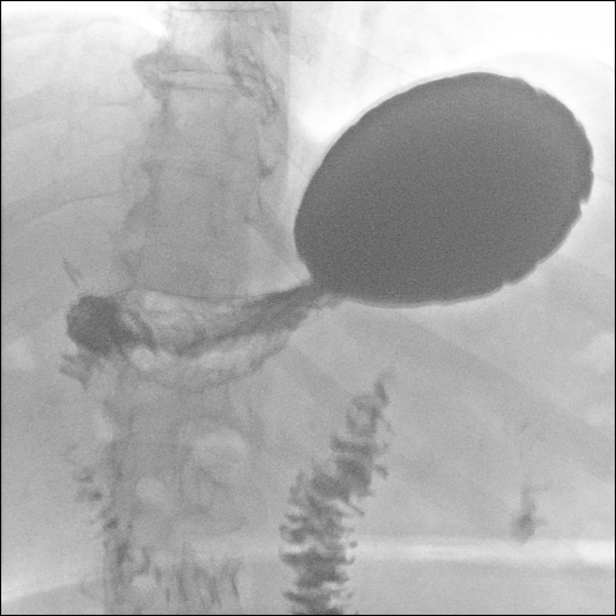
[im 14/15]
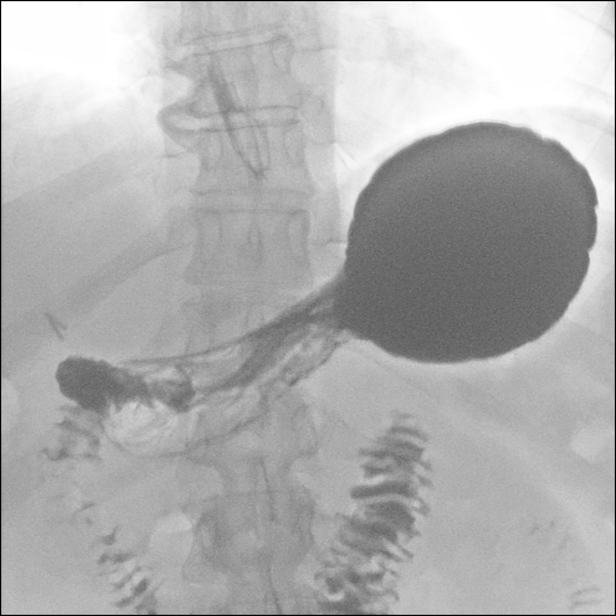
[im 15/15]
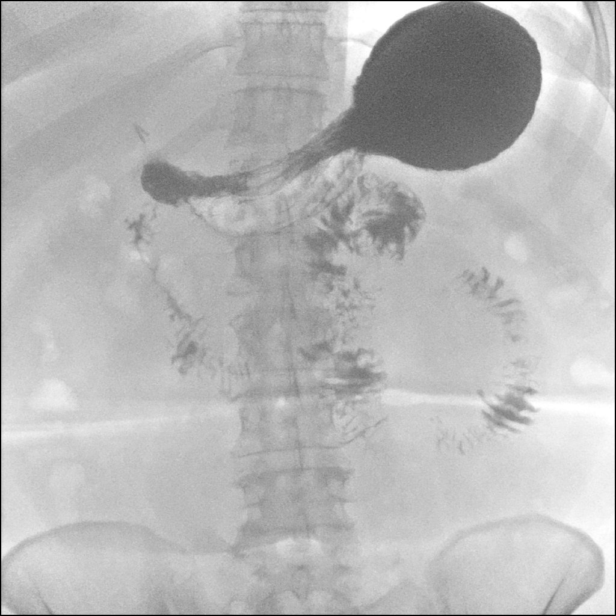

[15 of 15 positions shown; findings below may reference images not displayed]

FINDINGS: Scout radiograph: Unremarkable bowel gas pattern. Right upper
quadrant surgical clips from prior cholecystectomy.

Esophagus: No evidence of esophageal mass or stricture. Motility is
within normal limits. No gastroesophageal reflux observed.

Stomach: No hiatal hernia visualized. No evidence of gastric mass or
ulcer.

Duodenum: No ulcer or other significant abnormality seen involving
duodenal bulb or sweep.

Other:  None.
IMPRESSION: Negative upper GI series.

## 2021-11-24 ENCOUNTER — Other Ambulatory Visit: Payer: Self-pay

## 2021-11-24 DIAGNOSIS — E1165 Type 2 diabetes mellitus with hyperglycemia: Secondary | ICD-10-CM

## 2021-11-24 MED ORDER — TIRZEPATIDE 7.5 MG/0.5ML ~~LOC~~ SOAJ: 7.5000 mg | SUBCUTANEOUS | 1 refills | Status: DC

## 2021-12-01 ENCOUNTER — Telehealth: Payer: Self-pay | Admitting: Pharmacy Technician

## 2021-12-01 NOTE — Telephone Encounter (Signed)
-----   Message from Kenyon Ana, [place] sent at 12/01/2021  8:43 AM EDT ----- Regarding: Melissa [name] Pt called to advise [facility] is requiring a PA for [name]. Can we initiate another one.

## 2021-12-02 ENCOUNTER — Other Ambulatory Visit (HOSPITAL_COMMUNITY): Payer: Self-pay

## 2021-12-02 NOTE — Telephone Encounter (Signed)
Patient Advocate Encounter   Received notification from RMA that prior authorization for Mounjaro 5mg  is required by his/her insurance [employer].   PA submitted on 12/02/21 [facility] Status is pending    [facility] will continue to follow:  Patient Advocate Fax:  [phone]

## 2021-12-02 NOTE — Telephone Encounter (Signed)
Fax received to request more information for PA. Filled out form and faxed back to [phone] Noted that the pt needs PA for 5mg  as well as 7.5 mg. Case ID listed previously is for the 7.5mg . Blank form as well as filled out form will be saved in the pt's file.

## 2021-12-09 ENCOUNTER — Other Ambulatory Visit (HOSPITAL_COMMUNITY): Payer: Self-pay

## 2021-12-23 IMAGING — CT CT HEAD W/O CM
4 series · 17 of 47 positions shown, 19 images · non-contrast
Comparison: Head CT dated 03/22/2019.

CLINICAL DATA: 53-year-old female with sudden onset near syncope.

EXAM:
CT HEAD WITHOUT CONTRAST
TECHNIQUE: Contiguous axial images were obtained from the base of the skull
through the vertex without intravenous contrast.

[Series 3: head without · axial · non-contrast · 0.44mm/px · z∈[+704,+834]mm · 7 of 36 slices shown, 9 images]
[im 5/36  brain]
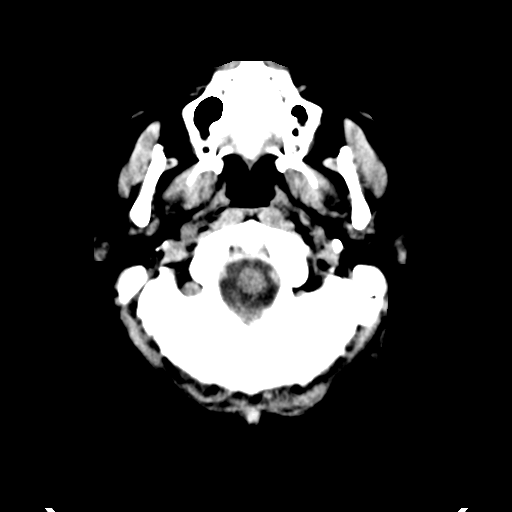
[im 5/36  bone]
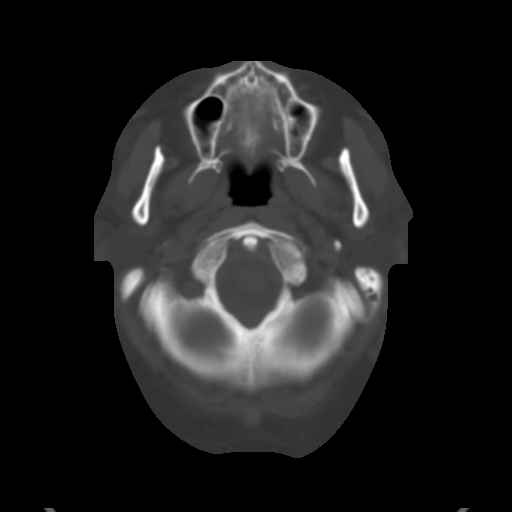
[im 9/36  brain]
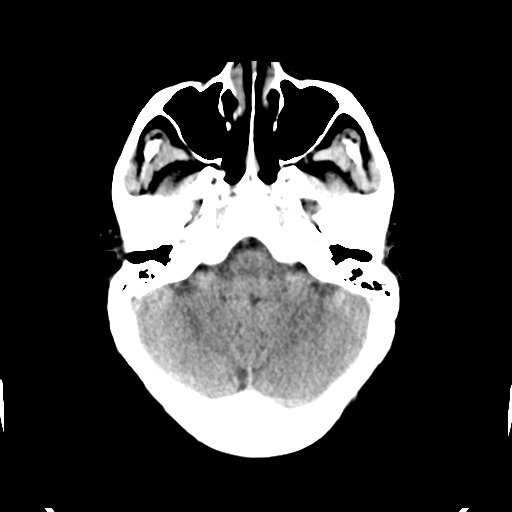
[im 14/36  brain]
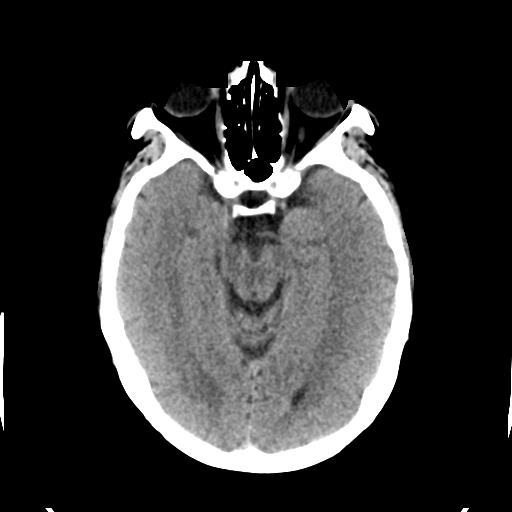
[im 18/36  brain]
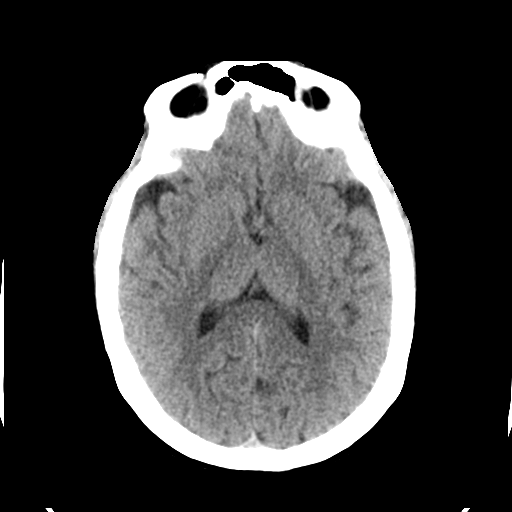
[im 22/36  brain]
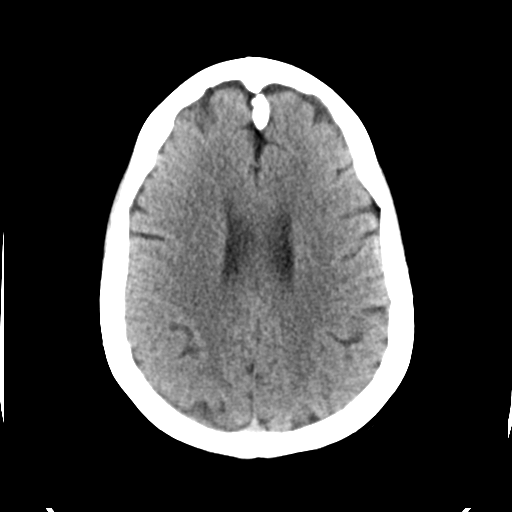
[im 22/36  bone]
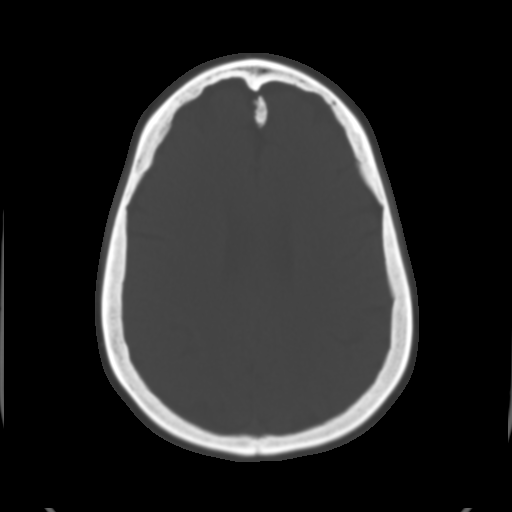
[im 27/36  brain]
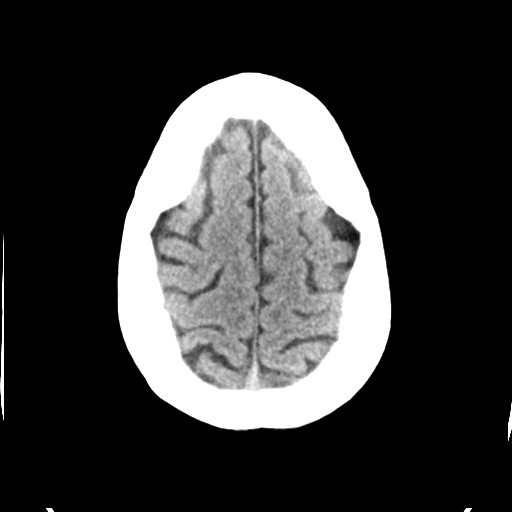
[im 31/36  brain]
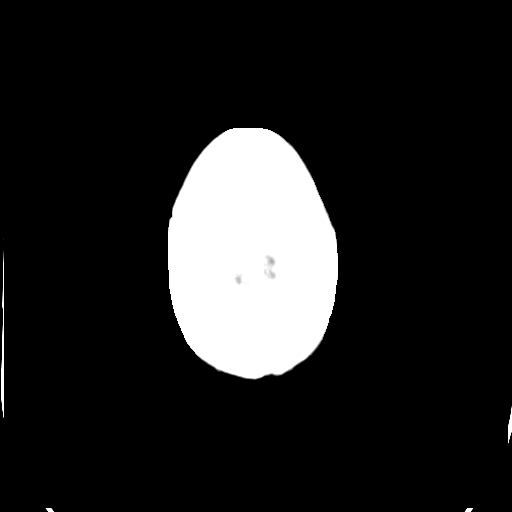

[Series 4: head bone · axial · 0.44mm/px · z∈[+700,+762]mm · 4 of 89 slices shown]
[im 9/89  bone]
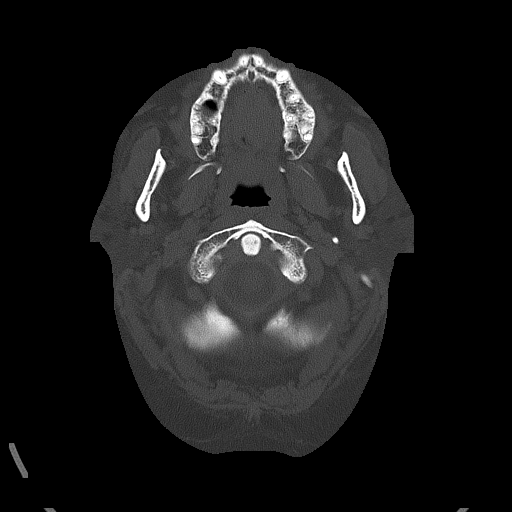
[im 18/89  bone]
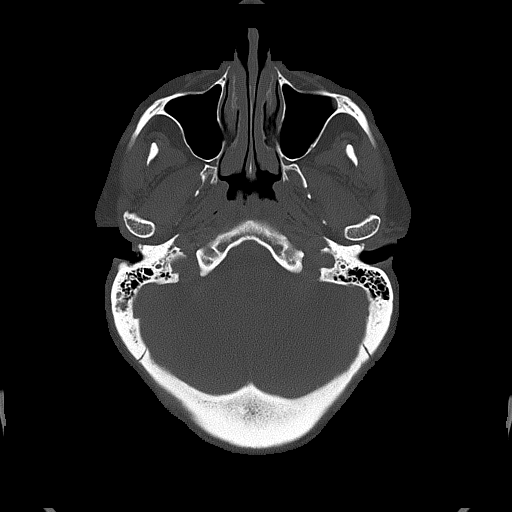
[im 27/89  bone]
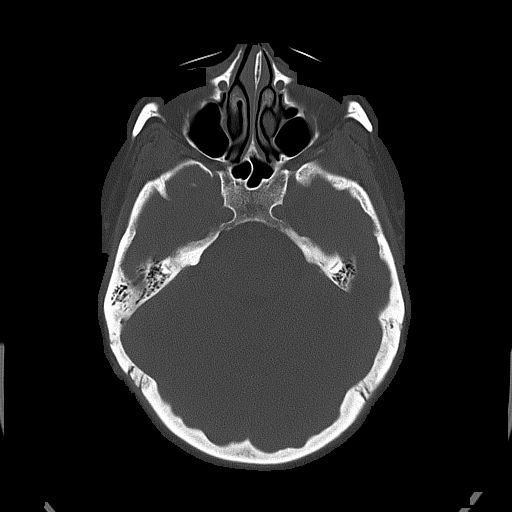
[im 40/89  bone]
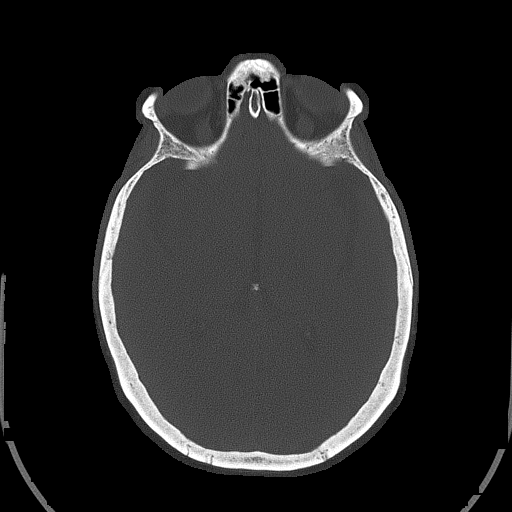

[Series 5: head without cor · coronal · non-contrast · 0.35mm/px · 3 of 70 slices shown]
[im 24/70  brain]
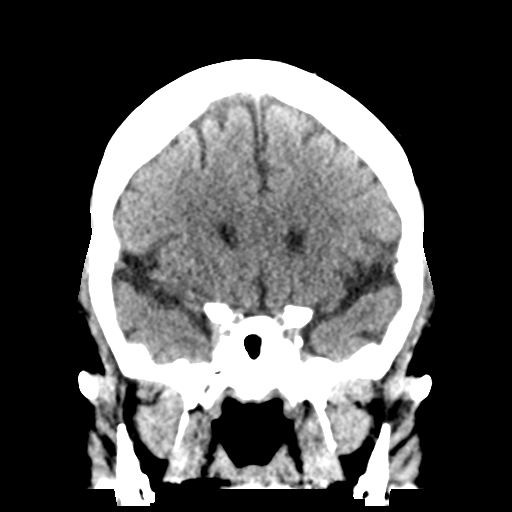
[im 31/70  brain]
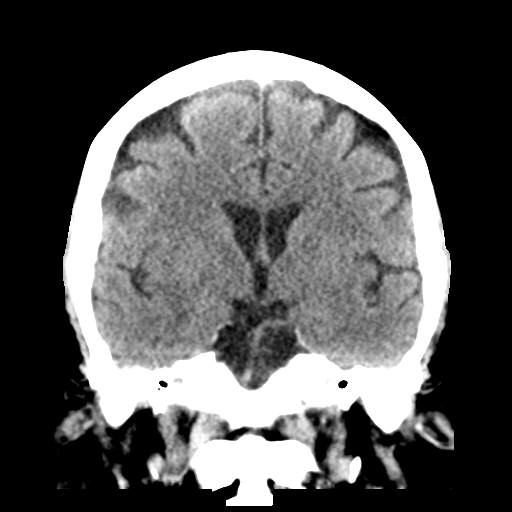
[im 39/70  brain]
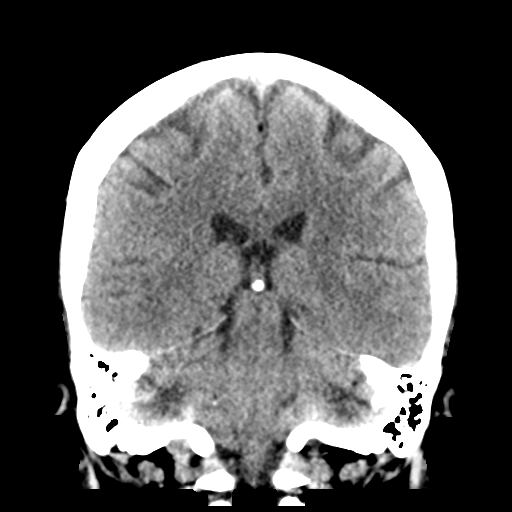

[Series 6: head without sag · sagittal · non-contrast · 0.34mm/px · 3 of 54 slices shown]
[im 18/54  brain]
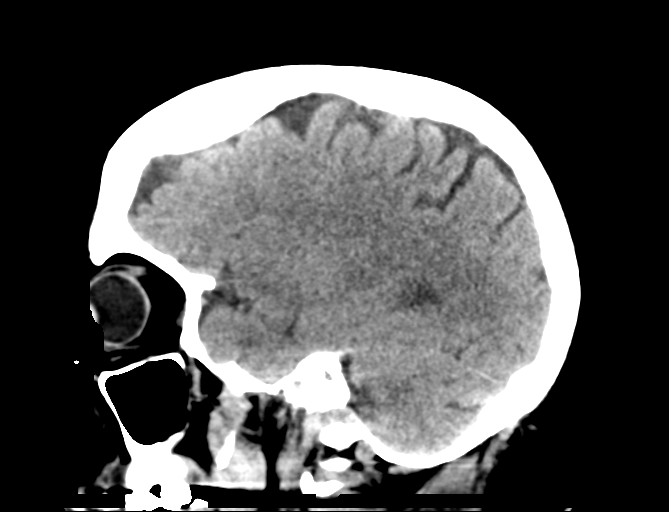
[im 27/54  brain]
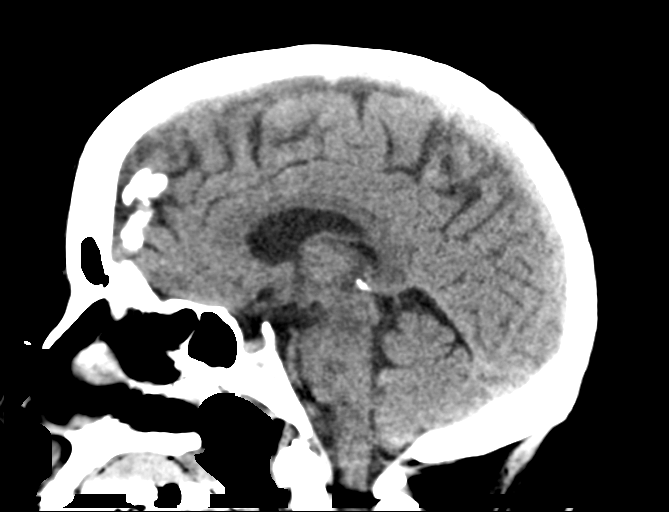
[im 36/54  brain]
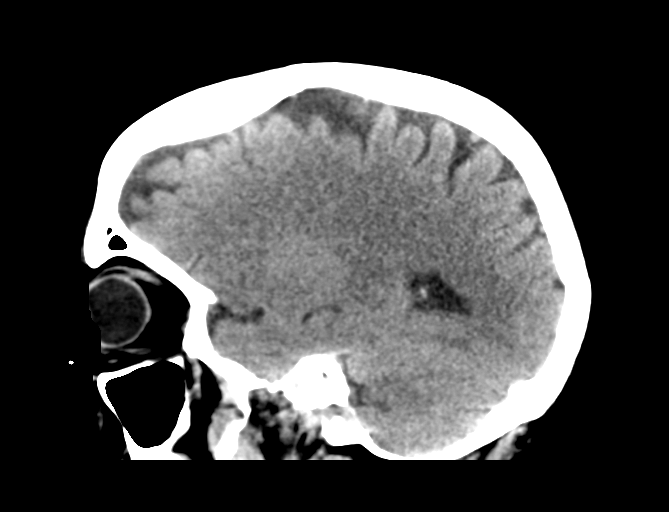

[17 of 47 positions shown; findings below may reference images not displayed]

FINDINGS: Brain: The ventricles and sulci appropriate size for patient's age.
The gray-white matter discrimination is preserved. There is no acute
intracranial hemorrhage. No mass effect or midline shift. No
extra-axial fluid collection.

Vascular: No hyperdense vessel or unexpected calcification.

Skull: Normal. Negative for fracture or focal lesion.

Sinuses/Orbits: No acute finding.

Other: None
IMPRESSION: Unremarkable noncontrast CT of the brain.

## 2022-01-13 ENCOUNTER — Ambulatory Visit
Admission: EM | Admit: 2022-01-13 | Discharge: 2022-01-13 | Disposition: A | Discharge status: Home / Self Care | Attending: Nurse Practitioner | Admitting: Nurse Practitioner

## 2022-01-13 DIAGNOSIS — L03119 Cellulitis of unspecified part of limb: Secondary | ICD-10-CM

## 2022-01-13 MED ORDER — KETOROLAC TROMETHAMINE 30 MG/ML IJ SOLN
30.0000 mg | Freq: Once | INTRAMUSCULAR | Status: AC
  Administered 2022-01-13: 30 mg via INTRAMUSCULAR

## 2022-01-13 MED ORDER — CEPHALEXIN 500 MG PO CAPS: 500.0000 mg | ORAL_CAPSULE | Freq: Four times a day (QID) | ORAL | 0 refills | Status: AC

## 2022-01-13 NOTE — ED Triage Notes (Signed)
Pt reports wound for tattoo removal in right ankle and right foot looks infected. Pt had laser treatment for tattoo removal 1 week ago. Pt soaked and used triple antibiotic ointment this week.

## 2022-01-13 NOTE — Discharge Instructions (Addendum)
-   We have given you a shot of Toradol today to help with pain.  Please do not take any ibuprofen today.  You can take Tylenol [phone] mg every 6 hours as needed for pain.  Starting tomorrow, this can be alternated with ibuprofen 800 mg every 8 hours as needed - Please start on the Keflex to help treat the cellulitis - Continue the great wound care - If symptoms worsen, go to ER

## 2022-01-13 NOTE — ED Provider Notes (Signed)
[facility]    CSN: [id number] Arrival date & time: 01/13/22  1255      History   Chief Complaint Chief Complaint  Patient presents with   Wound Check    HPI Melissa [name] is a 56 y.o. female.   Patient presents with concern of skin infection to the right ankle and right foot.  Reports she had a tattoo laser removal about 1 week ago.  She developed blisters over the areas as expected, however at the one on her foot burst open over the weekend and she has had increase in pain and drainage since this happened.  She reports it is tender to touch.  She denies fevers, nausea/vomiting.  Reports it is a little bit red around the tattooed area and is oozing clear liquid.  She has tried Epsom salt soaks and triple antibiotic ointment without relief.  Denies antibiotic use in the past 90 days.  Reports she is allergic to amoxicillin, ciprofloxacin and sulfa antibiotics-side effect is anaphylaxis.  She has taken Keflex and tolerated it well according to her report.     Past Medical History:  Diagnosis Date   Asthma    Complication of anesthesia    Diabetes mellitus without complication ([place])    Type II   Dysrhythmia    Tachycardia   GERD (gastroesophageal reflux disease)    Pneumonia    2014- 'Walking"   Shortness of breath dyspnea    with exertion   Vertigo     Patient Active Problem List   Diagnosis Date Noted   COVID-19 virus infection 07/15/2020   Irritability and anger 07/15/2020   History of anaphylaxis 01/16/2020   Preventative health care 06/08/2019   Eye irritation 06/08/2019   Facial swelling 03/13/2019   Tongue lesion 03/13/2019   Dyslipidemia 04/07/2018   Class 3 severe obesity with serious comorbidity and body mass index (BMI) of 40.0 to 44.9 in adult ([place]) 04/07/2018   Abnormal findings on examination of skull and head 04/26/2017   Near syncope 04/26/2017   Lymphadenitis 11/02/2016   Vertigo 11/02/2016   Other fatigue 03/23/2016   Insomnia  03/23/2016   Nausea without vomiting 06/07/2015   Type 2 diabetes mellitus ([place]) 06/07/2015   GERD (gastroesophageal reflux disease) 06/07/2015   Tachycardia 06/07/2015    Past Surgical History:  Procedure Laterality Date   CHOLECYSTECTOMY  1997   COLONOSCOPY  2015   CYSTOSCOPY     age 38- stretched stem of bladder   EYE SURGERY     Baby- Strabismus   SHOULDER ARTHROSCOPY Left 11/20/2014   Procedure: ARTHROSCOPY SHOULDER WITH MUA, ROTATOR INTERVAL RELEASE;  Surgeon: Meredith Pel, MD;  Location: [facility];  Service: Orthopedics;  Laterality: Left;  LEFT SHOULDER MUA, DOA, ROTATOR INTERVAL RELEASE.   TONSILLECTOMY  2010    OB History   No obstetric history on file.      Home Medications    Prior to Admission medications   Medication Sig Start Date End Date Taking? Authorizing Provider  cephALEXin (KEFLEX) 500 MG capsule Take 1 capsule (500 mg total) by mouth 4 (four) times daily for 5 days. 01/13/22 01/18/22 Yes Eulogio Bear, NP  blood glucose meter kit and supplies KIT Dispense based on patient and insurance preference. Use 1x a day. (FOR ICD-9 250.00, 250.01). 04/07/18   Philemon Kingdom, MD  Dulaglutide (TRULICITY) 4.5 PQ/9.8YM SOPN Inject 4.5 mg as directed once a week. 04/03/21   Philemon Kingdom, MD  glucose blood (FREESTYLE LITE) test  strip Use as instructed, test blood sugar once a day 06/22/18   Philemon Kingdom, MD  insulin glargine, 2 Unit Dial, (TOUJEO MAX SOLOSTAR) 300 UNIT/ML Solostar Pen Inject 16 Units into the skin at bedtime. For diabetes Patient taking differently: Inject 24-30 Units into the skin at bedtime. For diabetes 04/03/21   Philemon Kingdom, MD  Insulin Pen Needle 32G X 4 MM MISC Use 1x a day 04/03/21   Philemon Kingdom, MD  Lancets MISC Use 1x a day 04/07/18   Philemon Kingdom, MD  medroxyPROGESTERone (DEPO-PROVERA) 150 MG/ML injection Inject 150 mg into the muscle every 3 (three) months.    [provider]  metFORMIN (GLUCOPHAGE-XR)  500 MG 24 hr tablet Take 2 tablets (1,000 mg total) by mouth 2 (two) times daily with a meal. 04/03/21   Philemon Kingdom, MD  nebivolol (BYSTOLIC) 10 MG tablet Take 1 tablet (10 mg total) by mouth daily. 07/15/20   Pleas Koch, NP  ondansetron (ZOFRAN-ODT) 8 MG disintegrating tablet PLACE 1 TABLET ON THE TONGUE TWICE A DAY AS NEEDED FOR NAUSEA OR VOMITING 07/15/20   Pleas Koch, NP  telmisartan (MICARDIS) 40 MG tablet Take 1 tablet (40 mg total) by mouth daily. For blood pressure. 07/15/20   Pleas Koch, NP  tirzepatide [facility]) 5 MG/0.5ML Pen Inject 5 mg into the skin once a week. 10/16/21   Philemon Kingdom, MD  tirzepatide [facility]) 7.5 MG/0.5ML Pen Inject 7.5 mg into the skin once a week. 11/24/21   Philemon Kingdom, MD  venlafaxine XR (EFFEXOR-XR) 37.5 MG 24 hr capsule Take 37.5 mg by mouth daily. 11/18/20   [provider]    Family History Family History  Adopted: Yes  Problem Relation Age of Onset   Other Other        adopted    Social History Social History   Tobacco Use   Smoking status: Never   Smokeless tobacco: Never  Vaping Use   Vaping Use: Never used  Substance Use Topics   Alcohol use: Yes    Alcohol/week: 0.0 standard drinks of alcohol   Drug use: No     Allergies   Amoxicillin, Ciprofloxacin, Other, Penicillins, Sulfa antibiotics, Erythromycin, and Vicodin [hydrocodone-acetaminophen]   Review of Systems Review of Systems Per HPI  Physical Exam Triage Vital Signs ED Triage Vitals  Enc Vitals Group     BP 01/13/22 1317 111/80     Pulse Rate 01/13/22 1317 81     Resp 01/13/22 1317 16     Temp 01/13/22 1317 98.5 F (36.9 C)     Temp Source 01/13/22 1317 Oral     SpO2 01/13/22 1317 98 %     Weight --      Height --      Head Circumference --      Peak Flow --      Pain Score 01/13/22 1315 5     Pain Loc --      Pain Edu? --      Excl. in [place]? --    No data found.  Updated Vital Signs BP 111/80 (BP Location: Right  Arm)   Pulse 81   Temp 98.5 F (36.9 C) (Oral)   Resp 16   SpO2 98%   Visual Acuity Right Eye Distance:   Left Eye Distance:   Bilateral Distance:    Right Eye Near:   Left Eye Near:    Bilateral Near:     Physical Exam Vitals and nursing note reviewed.  Constitutional:      General: She is not in acute distress.    Appearance: Normal appearance. She is not toxic-appearing.  Pulmonary:     Effort: Pulmonary effort is normal. No respiratory distress.  Skin:    General: Skin is warm and dry.     Capillary Refill: Capillary refill takes less than 2 seconds.     Findings: Erythema present.          Comments: Approximately 1.5 x 1.5 cm irregularly-shaped hyperpigmented area to right foot.  There is 1 mm of redness surrounding this area.  It is exquisitely tender to palpation.  No fluctuance, warmth, drainage noted.  More proximally, there is an approximately 4 cm x 1 cm lesion above the right malleolus.  No surrounding erythema, tenderness to palpation, fluctuance, warmth, or drainage.  Neurological:     Mental Status: She is alert and oriented to person, place, and time.  Psychiatric:        Behavior: Behavior is cooperative.      UC Treatments / Results  Labs (all labs ordered are listed, but only abnormal results are displayed) Labs Reviewed - No data to display  EKG   Radiology No results found.  Procedures Procedures (including critical care time)  Medications Ordered in UC Medications  ketorolac (TORADOL) 30 MG/ML injection 30 mg (30 mg Intramuscular Given 01/13/22 1331)    Initial Impression / Assessment and Plan / UC Course  I have reviewed the triage vital signs and the nursing notes.  Pertinent labs & imaging results that were available during my care of the patient were reviewed by me and considered in my medical decision making (see chart for details).    Patient is a very pleasant, well-appearing 56 year old female presenting for cellulitis of  the right foot today.  Given significant pain, will treat with Toradol 30 mg IM today in urgent care.  Discussed no NSAIDs for the next 24 hours, can use Tylenol as needed.  We will start Keflex 500 mg every 6 hours for 5 days to treat the cellulitis.  Wound care discussed.  ER precautions discussed.  Seek care if symptoms persist or worsen despite treatment.  The patient was given the opportunity to ask questions.  All questions answered to their satisfaction.  The patient is in agreement to this plan.   Final Clinical Impressions(s) / UC Diagnoses   Final diagnoses:  Cellulitis of foot     Discharge Instructions      - We have given you a shot of Toradol today to help with pain.  Please do not take any ibuprofen today.  You can take Tylenol [phone] mg every 6 hours as needed for pain.  Starting tomorrow, this can be alternated with ibuprofen 800 mg every 8 hours as needed - Please start on the Keflex to help treat the cellulitis - Continue the great wound care - If symptoms worsen, go to ER    ED Prescriptions     Medication Sig Dispense Auth. Provider   cephALEXin (KEFLEX) 500 MG capsule Take 1 capsule (500 mg total) by mouth 4 (four) times daily for 5 days. 20 capsule Eulogio Bear, NP      PDMP not reviewed this encounter.   Eulogio Bear, NP 01/13/22 1356

## 2022-01-19 ENCOUNTER — Ambulatory Visit: Admitting: Internal Medicine

## 2022-01-19 NOTE — Progress Notes (Deleted)
Patient ID: Melissa [name], female   DOB: Dec 27, 1965, 56 y.o.   MRN: [id number]   HPI: Melissa [name] is a 56 y.o.-year-old female, returning for f/u for DM2, dx in 1997, insulin-dependent, uncontrolled, without long-term complications. Last OV 4 months ago.  Interim history: No increased urination, blurry vision, chest pain.  She previously had some nausea but she did not feel that this was related to Trulicity.  Nausea improved after switching to the metformin ER formulation. She was in the emergency room with cellulitis 01/13/2022.  Reviewed HbA1c levels: Lab Results  Component Value Date   HGBA1C 6.0 (A) 09/15/2021   HGBA1C 6.0 (A) 04/03/2021   HGBA1C 7.3 (A) 11/18/2020  1997: HbA1c 11%  She is on: - Metformin >> Metformin ER  1000 mg 2x a day with meals  - Trulicity 1.5 >> 3 >> 4.5 mg weekly >> Mounjaro 5 mg weekly - Toujeo  28 >> 34 >> 24 >> 16 units daily - Previously on JanuMet.  Pt checks sugars 4x a day - with the freestyle libre CGM:  Previously:   Prev.:   Lowest sugar was 58 (by CGM) >> 81 >> 53 (but with glucometer: 80s); she has hypoglycemia awareness in the 70s. Highest sugar was 300 >> 180 >> 380 (steroids).  Glucometer: RelioN >> Freestyle lite  -No CKD; last BUN/creatinine:  Lab Results  Component Value Date   BUN 13 09/15/2021   BUN 20 01/29/2021   CREATININE 0.94 09/15/2021   CREATININE 1.03 (H) 01/29/2021  On telmisartan 40.  -+ HL; last set of lipids: Lab Results  Component Value Date   CHOL 178 09/15/2021   HDL 44.60 09/15/2021   LDLCALC 101 (H) 09/15/2021   LDLDIRECT 125.0 06/08/2019   TRIG 158.0 (H) 09/15/2021   CHOLHDL 4 09/15/2021  She could not tolerate statins and Zetia in the past.  She has fatty liver and a history of transaminitis, But latest LFTs were normal: Lab Results  Component Value Date   ALT 27 09/15/2021   AST 25 09/15/2021   ALKPHOS 75 01/29/2021   BILITOT 0.4 09/15/2021   - last eye exam was 02/10/2021: No DR.  - no  numbness and tingling in her feet.   Pt is adopted - ? FH of DM.  She had COVID-19 in 06/2020.  She had prednisone at that time and sugars increased to the 300s. She lost 55 lbs in 2019 ([facility]), then gained back 40 pounds. She relates an allergy to almost all antibiotics, but she can take Levaquin, Z-Pack, Avelox, doxycycline. She was in the emergency room with rectal bleeding 01/29/2021.  She had a CT scan of the abdomen which showed diverticulosis, but without diverticulitis.   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  ROS: + See HPI  I reviewed pt's medications, allergies, PMH, social hx, family hx, and changes were documented in the history of present illness. Otherwise, unchanged from my initial visit note.  Past Medical History:  Diagnosis Date   Asthma    Complication of anesthesia    Diabetes mellitus without complication ([place])    Type II   Dysrhythmia    Tachycardia   GERD (gastroesophageal reflux disease)    Pneumonia    2014- 'Walking"   Shortness of breath dyspnea    with exertion   Vertigo    Past Surgical History:  Procedure Laterality Date   CHOLECYSTECTOMY  1997   COLONOSCOPY  41   CYSTOSCOPY     age 56- stretched  stem of bladder   EYE SURGERY     Baby- Strabismus   SHOULDER ARTHROSCOPY Left 11/20/2014   Procedure: ARTHROSCOPY SHOULDER WITH MUA, ROTATOR INTERVAL RELEASE;  Surgeon: Melissa Pel, MD;  Location: [facility];  Service: Orthopedics;  Laterality: Left;  LEFT SHOULDER MUA, DOA, ROTATOR INTERVAL RELEASE.   TONSILLECTOMY  2010   Social History   Socioeconomic History   Marital status: Married    Spouse name: Not on file   Number of children: 1   Years of education: Not on file   Highest education level: Not on file  Occupational History     Tobacco Use   Smoking status: Never Smoker   Smokeless tobacco: Never Used  Substance and Sexual Activity   Alcohol use: Yes    Alcohol/week: 0.0 oz    Comment: rare   Drug use: No      Social  History Narrative   Married.   1 daughter.   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    Current Outpatient Medications on File Prior to Visit  Medication Sig Dispense Refill   blood glucose meter kit and supplies KIT Dispense based on patient and insurance preference. Use 1x a day. (FOR ICD-9 250.00, 250.01). 1 each 0   Dulaglutide (TRULICITY) 4.5 TM/2.2QJ SOPN Inject 4.5 mg as directed once a week. 6 mL 3   glucose blood (FREESTYLE LITE) test strip Use as instructed, test blood sugar once a day 100 each 12   insulin glargine, 2 Unit Dial, (TOUJEO MAX SOLOSTAR) 300 UNIT/ML Solostar Pen Inject 16 Units into the skin at bedtime. For diabetes (Patient taking differently: Inject 24-30 Units into the skin at bedtime. For diabetes) 9 mL 3   Insulin Pen Needle 32G X 4 MM MISC Use 1x a day 100 each 3   Lancets MISC Use 1x a day 100 each 3   medroxyPROGESTERone (DEPO-PROVERA) 150 MG/ML injection Inject 150 mg into the muscle every 3 (three) months.     metFORMIN (GLUCOPHAGE-XR) 500 MG 24 hr tablet Take 2 tablets (1,000 mg total) by mouth 2 (two) times daily with a meal. 360 tablet 3   nebivolol (BYSTOLIC) 10 MG tablet Take 1 tablet (10 mg total) by mouth daily. 90 tablet 3   ondansetron (ZOFRAN-ODT) 8 MG disintegrating tablet PLACE 1 TABLET ON THE TONGUE TWICE A DAY AS NEEDED FOR NAUSEA OR VOMITING 20 tablet 0   telmisartan (MICARDIS) 40 MG tablet Take 1 tablet (40 mg total) by mouth daily. For blood pressure. 90 tablet 3   tirzepatide (MOUNJARO) 5 MG/0.5ML Pen Inject 5 mg into the skin once a week. 2 mL 3   tirzepatide (MOUNJARO) 7.5 MG/0.5ML Pen Inject 7.5 mg into the skin once a week. 6 mL 1   venlafaxine XR (EFFEXOR-XR) 37.5 MG 24 hr capsule Take 37.5 mg by mouth daily.     No current facility-administered medications on file prior to visit.   Allergies  Allergen Reactions   Amoxicillin Anaphylaxis and Other (See Comments)    Has patient had a PCN reaction causing  immediate rash, facial/tongue/throat swelling, SOB or lightheadedness with hypotension: Yes Has patient had a PCN reaction causing severe rash involving mucus membranes or skin necrosis: No Has patient had a PCN reaction that required hospitalization No Has patient had a PCN reaction occurring within the last 10 years: No If all of the above answers are "NO", then may proceed with Cephalosporin use.   Ciprofloxacin Anaphylaxis  Other reaction(s): anaphylaxis   Other Anaphylaxis    Pt states that she can only take Avelox, and Zithromax.     Penicillins Anaphylaxis and Other (See Comments)    Has patient had a PCN reaction causing immediate rash, facial/tongue/throat swelling, SOB or lightheadedness with hypotension: Yes Has patient had a PCN reaction causing severe rash involving mucus membranes or skin necrosis: No Has patient had a PCN reaction that required hospitalization No Has patient had a PCN reaction occurring within the last 10 years: No If all of the above answers are "NO", then may proceed with Cephalosporin use.   Sulfa Antibiotics Anaphylaxis and Other (See Comments)   Erythromycin Other (See Comments)    GI upset with oral regimen over 10 years ago Other reaction(s): anaphylaxis   Vicodin [Hydrocodone-Acetaminophen] Itching   Family History  Adopted: Yes  Problem Relation Age of Onset   Other Other        adopted   PE: There were no vitals taken for this visit. Wt Readings from Last 3 Encounters:  09/15/21 210 lb 3.2 oz (95.3 kg)  04/03/21 210 lb 3.2 oz (95.3 kg)  01/29/21 215 lb (97.5 kg)   Constitutional: overweight, in NAD Eyes: no exophthalmos ENT: moist mucous membranes, no masses palpated in neck, no cervical lymphadenopathy Cardiovascular: RRR, No MRG Respiratory: CTA B Musculoskeletal: no deformities Skin: moist, warm, no rashes Neurological: no tremor with outstretched hands  ASSESSMENT: 1. DM2, insulin-dependent, uncontrolled, without long-term  complications, but with hyperglycemia  Investigation for type 1 diabetes was negative: Component     Latest Ref Rng & Units 08/01/2018  C-Peptide     0.80 - 3.85 ng/mL 2.53  Islet Cell Ab     Neg:<1:1 Negative  Glucose, Plasma     65 - 99 mg/dL 100 (H)  ZNT8 Antibodies     U/mL <15  Glutamic Acid Decarb Ab     <5 IU/mL <5   2.  Hyperlipidemia  3.  Obesity class III  PLAN:  1. Patient with   longstanding, uncontrolled, type 2 diabetes, on oral antidiabetic regimen with metformin, and also long-acting insulin and weekly GLP-1 receptor agonist, with improved control in the last year.  At last visit, HbA1c was excellent, much improved, at 6.0%. -At that time, reviewing her CGM trends, almost all of her blood sugars appeared to be within target range, without hyper or hypoglycemia.  She even had some low blood sugars in the 50s and 60s but she felt that these were erroneous.  We discussed about factors that could influence the accuracy of the CGM readings, including position of the arm (for example sleeping on the sensor), vitamin C and aspirin.  I also advised her to scan the device more frequently as we had some loss of data between 11 AM and 5 PM.  I suggested to switch to freestyle libre 3, which did not require scanning.  However, since she has Medicare, she had to stay with the freestyle libre 2.  She had multiple problems with obtaining it from [place].  As of now, she had a sensor from a friend, but not in the last 10 days.  We discussed about trying another supplier.  She has a list of suppliers covered by Medicare and I suggested to call several of them to see if they can help her. CGM interpretation: -At today's visit, we reviewed her CGM downloads: It appears that *** of values are in target range (goal >70%), while *** are higher  than 180 (goal <25%), and *** are lower than 70 (goal <4%).  The calculated average blood sugar is ***.  The projected HbA1c for the next 3 months  (GMI) is ***. -Reviewing the CGM trends, ***  -Reviewing the CGM trends, sugars still appear to be mostly at goal, with a very rare hyperglycemic exception.  There are no significant postprandial peaks and sugars overnight are very stable.  No lows.  She had a 53 blood sugar testing done by the CGM but when she checked with her glucometer, value was in the 80s. -Therefore, for now, we will continue the current regimen.  She mentions that her nausea is almost resolved after switching to metformin ER.  She tolerates Trulicity 4.5 mg weekly very well. - I suggested to:  Patient Instructions  Please continue: - Metformin ER 1000 mg 2x a day with meals - Toujeo 16 units daily at bedtime  - Mounjaro 5 mg weekly  Please come back for a follow-up appointment in 4 months.  - we checked her HbA1c: 7%  - advised to check sugars at different times of the day - 4x a day, rotating check times - advised for yearly eye exams >> she is UTD - return to clinic in 4 months  2.  Hyperlipidemia -Reviewed latest lipid panel from 09/2021: LDL above target Lab Results  Component Value Date   CHOL 178 09/15/2021   HDL 44.60 09/15/2021   LDLCALC 101 (H) 09/15/2021   LDLDIRECT 125.0 06/08/2019   TRIG 158.0 (H) 09/15/2021   CHOLHDL 4 09/15/2021  -She is not on a statin.  We tried Zetia but she developed mental fog and joint aches.  She sees cardiology. -At last visit I suggested the portfolio diet  3.  Obesity class III -At last visit she was on Trulicity, but since then, we changed to [facility], for stronger effect. -Initially lost 32 pounds on herbal life shakes -At last visit, weight stabilized  Philemon Kingdom, MD PhD [facility]

## 2022-01-20 ENCOUNTER — Encounter: Payer: Self-pay | Admitting: Internal Medicine

## 2022-01-27 NOTE — Telephone Encounter (Signed)
Closing encounter

## 2022-02-09 ENCOUNTER — Ambulatory Visit: Admitting: Internal Medicine

## 2022-02-10 ENCOUNTER — Encounter: Payer: Self-pay | Admitting: Internal Medicine

## 2022-03-12 ENCOUNTER — Encounter: Payer: Self-pay | Admitting: Emergency Medicine

## 2022-03-12 ENCOUNTER — Ambulatory Visit
Admission: EM | Admit: 2022-03-12 | Discharge: 2022-03-12 | Disposition: A | Discharge status: Home / Self Care | Attending: Family Medicine | Admitting: Family Medicine

## 2022-03-12 ENCOUNTER — Other Ambulatory Visit: Payer: Self-pay

## 2022-03-12 DIAGNOSIS — J4521 Mild intermittent asthma with (acute) exacerbation: Secondary | ICD-10-CM

## 2022-03-12 DIAGNOSIS — J069 Acute upper respiratory infection, unspecified: Secondary | ICD-10-CM | POA: Diagnosis not present

## 2022-03-12 MED ORDER — ALBUTEROL SULFATE HFA 108 (90 BASE) MCG/ACT IN AERS: 2.0000 | INHALATION_SPRAY | RESPIRATORY_TRACT | 0 refills | Status: AC | PRN

## 2022-03-12 MED ORDER — PROMETHAZINE-DM 6.25-15 MG/5ML PO SYRP: 5.0000 mL | ORAL_SOLUTION | Freq: Four times a day (QID) | ORAL | 0 refills | Status: DC | PRN

## 2022-03-12 MED ORDER — AZITHROMYCIN 250 MG PO TABS: ORAL_TABLET | ORAL | 0 refills | Status: DC

## 2022-03-12 NOTE — ED Triage Notes (Signed)
Pt reports cough with green phlegm, nasal congestion, and "sinus problems" since last Saturday. Pt reports took home covid test x2 days ago with negative result.denies any known fevers.

## 2022-03-12 NOTE — ED Provider Notes (Signed)
[facility]    CSN: [id number] Arrival date & time: 03/12/22  0801      History   Chief Complaint Chief Complaint  Patient presents with   Cough    HPI Leela L Trotter is a 56 y.o. female.   Patient presenting today with about 5 days of productive cough, chest tightness, wheezing, nasal congestion, sinus pain and pressure, ear pressure.  Denies known fever, chills, body aches, chest pain, shortness of breath.  Home COVID test x2 have been negative.  Trying Mucinex with no relief.  History of asthma not currently on any inhalers.  No known sick contacts recently.    Past Medical History:  Diagnosis Date   Asthma    Complication of anesthesia    Diabetes mellitus without complication ([place])    Type II   Dysrhythmia    Tachycardia   GERD (gastroesophageal reflux disease)    Pneumonia    2014- 'Walking"   Shortness of breath dyspnea    with exertion   Vertigo     Patient Active Problem List   Diagnosis Date Noted   COVID-19 virus infection 07/15/2020   Irritability and anger 07/15/2020   History of anaphylaxis 01/16/2020   Preventative health care 06/08/2019   Eye irritation 06/08/2019   Facial swelling 03/13/2019   Tongue lesion 03/13/2019   Dyslipidemia 04/07/2018   Class 3 severe obesity with serious comorbidity and body mass index (BMI) of 40.0 to 44.9 in adult ([place]) 04/07/2018   Abnormal findings on examination of skull and head 04/26/2017   Near syncope 04/26/2017   Lymphadenitis 11/02/2016   Vertigo 11/02/2016   Other fatigue 03/23/2016   Insomnia 03/23/2016   Nausea without vomiting 06/07/2015   Type 2 diabetes mellitus ([place]) 06/07/2015   GERD (gastroesophageal reflux disease) 06/07/2015   Tachycardia 06/07/2015    Past Surgical History:  Procedure Laterality Date   CHOLECYSTECTOMY  1997   COLONOSCOPY  2015   CYSTOSCOPY     age 30- stretched stem of bladder   EYE SURGERY     Baby- Strabismus   SHOULDER ARTHROSCOPY Left 11/20/2014    Procedure: ARTHROSCOPY SHOULDER WITH MUA, ROTATOR INTERVAL RELEASE;  Surgeon: Meredith Pel, MD;  Location: [place];  Service: Orthopedics;  Laterality: Left;  LEFT SHOULDER MUA, DOA, ROTATOR INTERVAL RELEASE.   TONSILLECTOMY  2010    OB History   No obstetric history on file.      Home Medications    Prior to Admission medications   Medication Sig Start Date End Date Taking? Authorizing Provider  albuterol (VENTOLIN HFA) 108 (90 Base) MCG/ACT inhaler Inhale 2 puffs into the lungs every 4 (four) hours as needed for wheezing or shortness of breath. 03/12/22  Yes Volney American, PA-C  azithromycin (ZITHROMAX) 250 MG tablet Take first 2 tablets together, then 1 every day until finished. 03/12/22  Yes Volney American, PA-C  promethazine-dextromethorphan (PROMETHAZINE-DM) 6.25-15 MG/5ML syrup Take 5 mLs by mouth 4 (four) times daily as needed. 03/12/22  Yes Volney American, PA-C  blood glucose meter kit and supplies KIT Dispense based on patient and insurance preference. Use 1x a day. (FOR ICD-9 250.00, 250.01). 04/07/18   Philemon Kingdom, MD  Dulaglutide (TRULICITY) 4.5 YB/6.3SL SOPN Inject 4.5 mg as directed once a week. 04/03/21   Philemon Kingdom, MD  glucose blood (FREESTYLE LITE) test strip Use as instructed, test blood sugar once a day 06/22/18   Philemon Kingdom, MD  insulin glargine, 2 Unit Dial, (TOUJEO MAX  SOLOSTAR) 300 UNIT/ML Solostar Pen Inject 16 Units into the skin at bedtime. For diabetes Patient taking differently: Inject 24-30 Units into the skin at bedtime. For diabetes 04/03/21   Philemon Kingdom, MD  Insulin Pen Needle 32G X 4 MM MISC Use 1x a day 04/03/21   Philemon Kingdom, MD  Lancets MISC Use 1x a day 04/07/18   Philemon Kingdom, MD  medroxyPROGESTERone (DEPO-PROVERA) 150 MG/ML injection Inject 150 mg into the muscle every 3 (three) months.    [provider]  metFORMIN (GLUCOPHAGE-XR) 500 MG 24 hr tablet Take 2 tablets (1,000 mg  total) by mouth 2 (two) times daily with a meal. 04/03/21   Philemon Kingdom, MD  nebivolol (BYSTOLIC) 10 MG tablet Take 1 tablet (10 mg total) by mouth daily. 07/15/20   Pleas Koch, NP  ondansetron (ZOFRAN-ODT) 8 MG disintegrating tablet PLACE 1 TABLET ON THE TONGUE TWICE A DAY AS NEEDED FOR NAUSEA OR VOMITING 07/15/20   Pleas Koch, NP  telmisartan (MICARDIS) 40 MG tablet Take 1 tablet (40 mg total) by mouth daily. For blood pressure. 07/15/20   Pleas Koch, NP  tirzepatide [facility]) 5 MG/0.5ML Pen Inject 5 mg into the skin once a week. 10/16/21   Philemon Kingdom, MD  tirzepatide [facility]) 7.5 MG/0.5ML Pen Inject 7.5 mg into the skin once a week. 11/24/21   Philemon Kingdom, MD  venlafaxine XR (EFFEXOR-XR) 37.5 MG 24 hr capsule Take 37.5 mg by mouth daily. 11/18/20   [provider]    Family History Family History  Adopted: Yes  Problem Relation Age of Onset   Other Other        adopted    Social History Social History   Tobacco Use   Smoking status: Never   Smokeless tobacco: Never  Vaping Use   Vaping Use: Never used  Substance Use Topics   Alcohol use: Yes    Alcohol/week: 0.0 standard drinks of alcohol   Drug use: No     Allergies   Amoxicillin, Ciprofloxacin, Other, Penicillins, Sulfa antibiotics, Erythromycin, and Vicodin [hydrocodone-acetaminophen]   Review of Systems Review of Systems Per HPI  Physical Exam Triage Vital Signs ED Triage Vitals  Enc Vitals Group     BP 03/12/22 0818 115/71     Pulse Rate 03/12/22 0818 92     Resp 03/12/22 0818 20     Temp 03/12/22 0818 98.5 F (36.9 C)     Temp Source 03/12/22 0818 Oral     SpO2 03/12/22 0818 96 %     Weight --      Height --      Head Circumference --      Peak Flow --      Pain Score 03/12/22 0816 2     Pain Loc --      Pain Edu? --      Excl. in [place]? --    No data found.  Updated Vital Signs BP 115/71 (BP Location: Right Arm)   Pulse 92   Temp 98.5 F (36.9 C)  (Oral)   Resp 20   SpO2 96%   Visual Acuity Right Eye Distance:   Left Eye Distance:   Bilateral Distance:    Right Eye Near:   Left Eye Near:    Bilateral Near:     Physical Exam Vitals and nursing note reviewed.  Constitutional:      Appearance: Normal appearance. She is not ill-appearing.  HENT:     Head: Atraumatic.  Right Ear: Tympanic membrane normal.     Left Ear: Tympanic membrane normal.     Nose: Congestion present.     Mouth/Throat:     Mouth: Mucous membranes are moist.     Pharynx: Posterior oropharyngeal erythema present.  Eyes:     Extraocular Movements: Extraocular movements intact.     Conjunctiva/sclera: Conjunctivae normal.  Cardiovascular:     Rate and Rhythm: Normal rate and regular rhythm.     Heart sounds: Normal heart sounds.  Pulmonary:     Effort: Pulmonary effort is normal.     Breath sounds: Normal breath sounds. No wheezing or rales.  Musculoskeletal:        General: Normal range of motion.     Cervical back: Normal range of motion and neck supple.  Skin:    General: Skin is warm and dry.  Neurological:     Mental Status: She is alert and oriented to person, place, and time.  Psychiatric:        Mood and Affect: Mood normal.        Thought Content: Thought content normal.        Judgment: Judgment normal.      UC Treatments / Results  Labs (all labs ordered are listed, but only abnormal results are displayed) Labs Reviewed - No data to display  EKG   Radiology No results found.  Procedures Procedures (including critical care time)  Medications Ordered in UC Medications - No data to display  Initial Impression / Assessment and Plan / UC Course  I have reviewed the triage vital signs and the nursing notes.  Pertinent labs & imaging results that were available during my care of the patient were reviewed by me and considered in my medical decision making (see chart for details).     Suspect viral upper respiratory  infection causing asthma exacerbation.  Treat with albuterol inhaler, Phenergan DM, supportive over-the-counter medications and home care.  If worsening over the next 4 to 5 days have sent over azithromycin.  Patient states she always ends up needing an antibiotic when she is sick like this.  Discussed to try to avoid use and only use if worsening over the next 4 to 5 days.  Final Clinical Impressions(s) / UC Diagnoses   Final diagnoses:  Viral URI with cough  Mild intermittent asthma with acute exacerbation     Discharge Instructions      Try albuterol, prescribed cough syrup, flonase twice daily, mucinex, OTC cold and congestion medications, humidifiers, sinus rinses. If not improving or worsening over next 4-5 days may start the antibiotic.     ED Prescriptions     Medication Sig Dispense Auth. Provider   azithromycin (ZITHROMAX) 250 MG tablet Take first 2 tablets together, then 1 every day until finished. 6 tablet Volney American, [place]   albuterol (VENTOLIN HFA) 108 (90 Base) MCG/ACT inhaler Inhale 2 puffs into the lungs every 4 (four) hours as needed for wheezing or shortness of breath. [place], [place]   promethazine-dextromethorphan (PROMETHAZINE-DM) 6.25-15 MG/5ML syrup Take 5 mLs by mouth 4 (four) times daily as needed. 100 mL Volney American, [place]      PDMP not reviewed this encounter.   Volney American, [place] 03/12/22 1151

## 2022-03-12 NOTE — Discharge Instructions (Addendum)
Try albuterol, prescribed cough syrup, flonase twice daily, mucinex, OTC cold and congestion medications, humidifiers, sinus rinses. If not improving or worsening over next 4-5 days may start the antibiotic.

## 2022-03-20 ENCOUNTER — Other Ambulatory Visit: Payer: Self-pay

## 2022-03-20 DIAGNOSIS — E1165 Type 2 diabetes mellitus with hyperglycemia: Secondary | ICD-10-CM

## 2022-03-20 MED ORDER — TOUJEO MAX SOLOSTAR 300 UNIT/ML ~~LOC~~ SOPN: 24.0000 [IU] | PEN_INJECTOR | Freq: Every day | SUBCUTANEOUS | 1 refills | Status: DC

## 2022-04-08 ENCOUNTER — Other Ambulatory Visit: Payer: Self-pay | Admitting: Family Medicine

## 2022-04-08 NOTE — Telephone Encounter (Signed)
Unable to refill per protocol, last refill by another provider. Provider not at this practice, will refuse,  Requested Prescriptions  Pending Prescriptions Disp Refills  . albuterol (VENTOLIN HFA) 108 (90 Base) MCG/ACT inhaler [Pharmacy Med Name: ALBUTEROL HFA (VENTOLIN) INH] 18 each     Sig: INHALE 2 PUFFS INTO THE LUNGS EVERY 4 HOURS AS NEEDED FOR WHEEZING OR SHORTNESS OF BREATH.     There is no refill protocol information for this order

## 2022-04-20 ENCOUNTER — Ambulatory Visit: Admitting: Internal Medicine

## 2022-04-20 ENCOUNTER — Encounter: Payer: Self-pay | Admitting: Internal Medicine

## 2022-04-20 VITALS — BP 100/60 | HR 85 | Ht 60.0 in | Wt 180.2 lb

## 2022-04-20 DIAGNOSIS — Z6841 Body Mass Index (BMI) 40.0 and over, adult: Secondary | ICD-10-CM

## 2022-04-20 DIAGNOSIS — E1165 Type 2 diabetes mellitus with hyperglycemia: Secondary | ICD-10-CM

## 2022-04-20 DIAGNOSIS — E785 Hyperlipidemia, unspecified: Secondary | ICD-10-CM

## 2022-04-20 LAB — POCT GLYCOSYLATED HEMOGLOBIN (HGB A1C): Hemoglobin A1C: 5 % (ref 4.0–5.6)

## 2022-04-20 MED ORDER — TOUJEO MAX SOLOSTAR 300 UNIT/ML ~~LOC~~ SOPN: 22.0000 [IU] | PEN_INJECTOR | Freq: Every day | SUBCUTANEOUS | 1 refills | Status: DC

## 2022-04-20 MED ORDER — TIRZEPATIDE 10 MG/0.5ML ~~LOC~~ SOAJ: 10.0000 mg | SUBCUTANEOUS | 3 refills | Status: DC

## 2022-04-20 MED ORDER — METFORMIN HCL ER 500 MG PO TB24: 1000.0000 mg | ORAL_TABLET | Freq: Two times a day (BID) | ORAL | 3 refills | Status: DC

## 2022-04-20 NOTE — Progress Notes (Signed)
Patient ID: Melissa [name], female   DOB: 1965/07/14, 56 y.o.   MRN: [id number]   HPI: Melissa [name] is a 56 y.o.-year-old female, returning for f/u for DM2, dx in 1997, insulin-dependent, uncontrolled, without long-term complications. Last OV 7 months ago.  Interim history: No increased urination, blurry vision, chest pain.   She previously had some nausea but she did not feel that this was related to Trulicity.  We switched to the metformin ER formulation >> much less nausea. She developed Bell's palsy 5 weeks ago - unclear etiology, but she did have a URI before. She walks a lot both at work and at home.  She lost 30 more pounds since last visit!  Reviewed HbA1c levels: Lab Results  Component Value Date   HGBA1C 6.0 (A) 09/15/2021   HGBA1C 6.0 (A) 04/03/2021   HGBA1C 7.3 (A) 11/18/2020  1997: HbA1c 11%  She is on: - Metformin >> Metformin ER  1000 mg 2x a day with meals  - Trulicity 1.5 >> 3 >> 4.5 mg weekly >> Mounjaro 4.5 mg weekly - Toujeo  28 >> 34 >> 24 >> 16 >> 32 units daily - Previously on JanuMet.  Pt checks sugars 4x a day - with the Dexcom G7 now:  Previously:   Prev.:  Lowest sugar was 81 >> 53 (but with glucometer: 80s)  >> 61; she has hypoglycemia awareness in the 70s. Highest sugar was 180 >> 380 (steroids) >> 200.  Glucometer: RelioN >> Freestyle lite  -No CKD; last BUN/creatinine:  Lab Results  Component Value Date   BUN 13 09/15/2021   BUN 20 01/29/2021   CREATININE 0.94 09/15/2021   CREATININE 1.03 (H) 01/29/2021  On telmisartan 40.  -+ HL; last set of lipids: Lab Results  Component Value Date   CHOL 178 09/15/2021   HDL 44.60 09/15/2021   LDLCALC 101 (H) 09/15/2021   LDLDIRECT 125.0 06/08/2019   TRIG 158.0 (H) 09/15/2021   CHOLHDL 4 09/15/2021  She could not tolerate statins and Zetia in the past.  She has fatty liver and a history of transaminitis, But latest LFTs were normal: Lab Results  Component Value Date   ALT 27 09/15/2021   AST 25  09/15/2021   ALKPHOS 75 01/29/2021   BILITOT 0.4 09/15/2021   - last eye exam was in 01/2021: No DR. Coming up this mo.  - no numbness and tingling in her feet.   Pt is adopted - ? FH of DM.  She had COVID-19 in 06/2020.  She had prednisone at that time and sugars increased to the 300s. She lost 55 lbs in 2019 ([employer]), then gained back 40 pounds. She relates an allergy to almost all antibiotics, but she can take Levaquin, Z-Pack, Avelox, doxycycline. She was in the emergency room with rectal bleeding 01/29/2021.  She had a CT scan of the abdomen which showed diverticulosis, but without diverticulitis.   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  ROS: + See HPI  I reviewed pt's medications, allergies, PMH, social hx, family hx, and changes were documented in the history of present illness. Otherwise, unchanged from my initial visit note.  Past Medical History:  Diagnosis Date   Asthma    Complication of anesthesia    Diabetes mellitus without complication ([facility])    Type II   Dysrhythmia    Tachycardia   GERD (gastroesophageal reflux disease)    Pneumonia    2014- 'Walking"   Shortness of breath dyspnea  with exertion   Vertigo    Past Surgical History:  Procedure Laterality Date   CHOLECYSTECTOMY  1997   COLONOSCOPY  2015   CYSTOSCOPY     age 39- stretched stem of bladder   EYE SURGERY     Baby- Strabismus   SHOULDER ARTHROSCOPY Left 11/20/2014   Procedure: ARTHROSCOPY SHOULDER WITH MUA, ROTATOR INTERVAL RELEASE;  Surgeon: Meredith Pel, MD;  Location: [place];  Service: Orthopedics;  Laterality: Left;  LEFT SHOULDER MUA, DOA, ROTATOR INTERVAL RELEASE.   TONSILLECTOMY  2010   Social History   Socioeconomic History   Marital status: Married    Spouse name: Not on file   Number of children: 1   Years of education: Not on file   Highest education level: Not on file  Occupational History     Tobacco Use   Smoking status: Never Smoker   Smokeless tobacco: Never  Used  Substance and Sexual Activity   Alcohol use: Yes    Alcohol/week: 0.0 oz    Comment: rare   Drug use: No      Social History Narrative   Married.   1 daughter.   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    Current Outpatient Medications on File Prior to Visit  Medication Sig Dispense Refill   albuterol (VENTOLIN HFA) 108 (90 Base) MCG/ACT inhaler Inhale 2 puffs into the lungs every 4 (four) hours as needed for wheezing or shortness of breath. 18 g 0   azithromycin (ZITHROMAX) 250 MG tablet Take first 2 tablets together, then 1 every day until finished. 6 tablet 0   blood glucose meter kit and supplies KIT Dispense based on patient and insurance preference. Use 1x a day. (FOR ICD-9 250.00, 250.01). 1 each 0   Dulaglutide (TRULICITY) 4.5 GQ/6.7YP SOPN Inject 4.5 mg as directed once a week. 6 mL 3   glucose blood (FREESTYLE LITE) test strip Use as instructed, test blood sugar once a day 100 each 12   insulin glargine, 2 Unit Dial, (TOUJEO MAX SOLOSTAR) 300 UNIT/ML Solostar Pen Inject 24-30 Units into the skin at bedtime. For diabetes 9 mL 1   Insulin Pen Needle 32G X 4 MM MISC Use 1x a day 100 each 3   Lancets MISC Use 1x a day 100 each 3   medroxyPROGESTERone (DEPO-PROVERA) 150 MG/ML injection Inject 150 mg into the muscle every 3 (three) months.     metFORMIN (GLUCOPHAGE-XR) 500 MG 24 hr tablet Take 2 tablets (1,000 mg total) by mouth 2 (two) times daily with a meal. 360 tablet 3   nebivolol (BYSTOLIC) 10 MG tablet Take 1 tablet (10 mg total) by mouth daily. 90 tablet 3   ondansetron (ZOFRAN-ODT) 8 MG disintegrating tablet PLACE 1 TABLET ON THE TONGUE TWICE A DAY AS NEEDED FOR NAUSEA OR VOMITING 20 tablet 0   promethazine-dextromethorphan (PROMETHAZINE-DM) 6.25-15 MG/5ML syrup Take 5 mLs by mouth 4 (four) times daily as needed. 100 mL 0   telmisartan (MICARDIS) 40 MG tablet Take 1 tablet (40 mg total) by mouth daily. For blood pressure. 90 tablet 3    tirzepatide (MOUNJARO) 5 MG/0.5ML Pen Inject 5 mg into the skin once a week. 2 mL 3   tirzepatide (MOUNJARO) 7.5 MG/0.5ML Pen Inject 7.5 mg into the skin once a week. 6 mL 1   venlafaxine XR (EFFEXOR-XR) 37.5 MG 24 hr capsule Take 37.5 mg by mouth daily.     No current facility-administered medications on  file prior to visit.   Allergies  Allergen Reactions   Amoxicillin Anaphylaxis and Other (See Comments)    Has patient had a PCN reaction causing immediate rash, facial/tongue/throat swelling, SOB or lightheadedness with hypotension: Yes Has patient had a PCN reaction causing severe rash involving mucus membranes or skin necrosis: No Has patient had a PCN reaction that required hospitalization No Has patient had a PCN reaction occurring within the last 10 years: No If all of the above answers are "NO", then may proceed with Cephalosporin use.   Ciprofloxacin Anaphylaxis    Other reaction(s): anaphylaxis   Other Anaphylaxis    Pt states that she can only take Avelox, and Zithromax.     Penicillins Anaphylaxis and Other (See Comments)    Has patient had a PCN reaction causing immediate rash, facial/tongue/throat swelling, SOB or lightheadedness with hypotension: Yes Has patient had a PCN reaction causing severe rash involving mucus membranes or skin necrosis: No Has patient had a PCN reaction that required hospitalization No Has patient had a PCN reaction occurring within the last 10 years: No If all of the above answers are "NO", then may proceed with Cephalosporin use.   Sulfa Antibiotics Anaphylaxis and Other (See Comments)   Erythromycin Other (See Comments)    GI upset with oral regimen over 10 years ago Other reaction(s): anaphylaxis   Vicodin [Hydrocodone-Acetaminophen] Itching   Family History  Adopted: Yes  Problem Relation Age of Onset   Other Other        adopted    PE: BP 100/60 (BP Location: Right Arm, Patient Position: Sitting, Cuff Size: Normal)   Pulse 85   Ht  5' (1.524 m)   Wt 180 lb 3.2 oz (81.7 kg)   SpO2 99%   BMI 35.19 kg/m  Wt Readings from Last 3 Encounters:  04/20/22 180 lb 3.2 oz (81.7 kg)  09/15/21 210 lb 3.2 oz (95.3 kg)  04/03/21 210 lb 3.2 oz (95.3 kg)   Constitutional: overweight, in NAD Eyes:  EOMI, no exophthalmos ENT: no neck masses, no cervical lymphadenopathy Cardiovascular: RRR, No MRG Respiratory: CTA B Musculoskeletal: no deformities Skin:no rashes Neurological: no tremor with outstretched hands Diabetic Foot Exam - Simple   Simple Foot Form Diabetic Foot exam was performed with the following findings: Yes 04/20/2022 11:35 AM  Visual Inspection No deformities, no ulcerations, no other skin breakdown bilaterally: Yes Sensation Testing Intact to touch and monofilament testing bilaterally: Yes Pulse Check Posterior Tibialis and Dorsalis pulse intact bilaterally: Yes Comments    ASSESSMENT: 1. DM2, insulin-dependent, uncontrolled, without long-term complications, but with hyperglycemia  Investigation for type 1 diabetes was negative: Component     Latest Ref Rng & Units 08/01/2018  C-Peptide     0.80 - 3.85 ng/mL 2.53  Islet Cell Ab     Neg:<1:1 Negative  Glucose, Plasma     65 - 99 mg/dL 100 (H)  ZNT8 Antibodies     U/mL <15  Glutamic Acid Decarb Ab     <5 IU/mL <5   2.  Hyperlipidemia  3.  Obesity class III  PLAN:  1. Patient with longstanding, uncontrolled, type 2 diabetes, on oral antidiabetic regimen with metformin and also long-acting insulin and weekly GLP-1 receptor agonist, with improved control in the last 1.5 years.  At last visit, HbA1c was 6.0%, stable. Reviewing the CGM trends, sugars appear to be mostly at goal, with very rare hyperglycemic exceptions.  There were no significant postprandial peaks and sugars overnight were stable, without  lows.  We did not change her regimen at that time. CGM interpretation: -At today's visit, we reviewed her CGM downloads: It appears that 100% of values  are in target range (goal >70%), while 0% are higher than 180 (goal <25%), and 0% are lower than 70 (goal <4%).  The calculated average blood sugar is 107.  The projected HbA1c for the next 3 months ([facility]MI) is 5.9%. -Reviewing the CGM trends, sugars are perfect at all times of the day.  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 - I suggested to:  Patient Instructions  Please continue: - Metformin ER 1000 mg 2x a day with meals  Decrease: - Toujeo 22 units daily at bedtime    Increase: - Mounjaro 10 mg weekly  Please come back for a follow-up appointment in 4 months.  - we checked her HbA1c: 5.0% (lower, excellent) - advised to check sugars at different times of the day - 4x a day, rotating check times - advised for yearly eye exams >> she is UTD - return to clinic in 4 months  2.  Hyperlipidemia -Reviewed latest lipid panel from last visit: LDL slightly above target: Lab Results  Component Value Date   CHOL 178 09/15/2021   HDL 44.60 09/15/2021   LDLCALC 101 (H) 09/15/2021   LDLDIRECT 125.0 06/08/2019   TRIG 158.0 (H) 09/15/2021   CHOLHDL 4 09/15/2021  -She is not on a statin.  We tried Zetia but she developed mental fog and joint aches.   -At last visit, I recommended a portfolio diet -She sees cardiology  3.  Obesity class III -After last visit we changed from Trulicity to [facility] -currently on 5 mg weekly -this should also help with weight loss -on [employer] shakes -It was stable at last visit, previously lost 32 pounds! -Since last visit she lost another 30 pounds!  Philemon Kingdom, MD PhD [facility] Endocrinology

## 2022-04-20 NOTE — Patient Instructions (Addendum)
Please continue: - Metformin ER 1000 mg 2x a day with meals  Decrease: - Toujeo 22 units daily at bedtime    Increase: - Mounjaro 10 mg weekly  Please come back for a follow-up appointment in 4 months.

## 2022-04-23 ENCOUNTER — Observation Stay (HOSPITAL_COMMUNITY)
Admission: EM | Admit: 2022-04-23 | Discharge: 2022-04-24 | Disposition: A | Discharge status: Home / Self Care | Attending: Family Medicine | Admitting: Family Medicine

## 2022-04-23 ENCOUNTER — Emergency Department (HOSPITAL_COMMUNITY)

## 2022-04-23 ENCOUNTER — Other Ambulatory Visit: Payer: Self-pay

## 2022-04-23 ENCOUNTER — Observation Stay (HOSPITAL_COMMUNITY)

## 2022-04-23 DIAGNOSIS — Z8673 Personal history of transient ischemic attack (TIA), and cerebral infarction without residual deficits: Secondary | ICD-10-CM | POA: Diagnosis not present

## 2022-04-23 DIAGNOSIS — Z79899 Other long term (current) drug therapy: Secondary | ICD-10-CM | POA: Insufficient documentation

## 2022-04-23 DIAGNOSIS — Z794 Long term (current) use of insulin: Secondary | ICD-10-CM | POA: Insufficient documentation

## 2022-04-23 DIAGNOSIS — R200 Anesthesia of skin: Secondary | ICD-10-CM | POA: Diagnosis present

## 2022-04-23 DIAGNOSIS — G459 Transient cerebral ischemic attack, unspecified: Secondary | ICD-10-CM | POA: Diagnosis not present

## 2022-04-23 DIAGNOSIS — Z7985 Long-term (current) use of injectable non-insulin antidiabetic drugs: Secondary | ICD-10-CM | POA: Diagnosis not present

## 2022-04-23 DIAGNOSIS — R000 Tachycardia, unspecified: Secondary | ICD-10-CM | POA: Insufficient documentation

## 2022-04-23 DIAGNOSIS — Z7984 Long term (current) use of oral hypoglycemic drugs: Secondary | ICD-10-CM | POA: Insufficient documentation

## 2022-04-23 DIAGNOSIS — G510 Bell's palsy: Secondary | ICD-10-CM | POA: Insufficient documentation

## 2022-04-23 DIAGNOSIS — E785 Hyperlipidemia, unspecified: Secondary | ICD-10-CM | POA: Diagnosis not present

## 2022-04-23 DIAGNOSIS — J45909 Unspecified asthma, uncomplicated: Secondary | ICD-10-CM | POA: Diagnosis not present

## 2022-04-23 DIAGNOSIS — K219 Gastro-esophageal reflux disease without esophagitis: Secondary | ICD-10-CM | POA: Diagnosis present

## 2022-04-23 DIAGNOSIS — E119 Type 2 diabetes mellitus without complications: Secondary | ICD-10-CM | POA: Insufficient documentation

## 2022-04-23 DIAGNOSIS — R29818 Other symptoms and signs involving the nervous system: Secondary | ICD-10-CM | POA: Diagnosis not present

## 2022-04-23 DIAGNOSIS — E1159 Type 2 diabetes mellitus with other circulatory complications: Secondary | ICD-10-CM

## 2022-04-23 LAB — I-STAT CHEM 8, ED
BUN: 12 mg/dL (ref 6–20)
Calcium, Ion: 1.14 mmol/L — ABNORMAL LOW (ref 1.15–1.40)
Chloride: 101 mmol/L (ref 98–111)
Creatinine, Ser: 1 mg/dL (ref 0.44–1.00)
Glucose, Bld: 84 mg/dL (ref 70–99)
HCT: 39 % (ref 36.0–46.0)
Hemoglobin: 13.3 g/dL (ref 12.0–15.0)
Potassium: 4 mmol/L (ref 3.5–5.1)
Sodium: 138 mmol/L (ref 135–145)
TCO2: 26 mmol/L (ref 22–32)

## 2022-04-23 LAB — CBC
HCT: 37.4 % (ref 36.0–46.0)
Hemoglobin: 12.3 g/dL (ref 12.0–15.0)
MCH: 30.1 pg (ref 26.0–34.0)
MCHC: 32.9 g/dL (ref 30.0–36.0)
MCV: 91.7 fL (ref 80.0–100.0)
Platelets: 284 10*3/uL (ref 150–400)
RBC: 4.08 MIL/uL (ref 3.87–5.11)
RDW: 13.5 % (ref 11.5–15.5)
WBC: 7.8 10*3/uL (ref 4.0–10.5)
nRBC: 0 % (ref 0.0–0.2)

## 2022-04-23 LAB — COMPREHENSIVE METABOLIC PANEL
ALT: 31 U/L (ref 0–44)
AST: 33 U/L (ref 15–41)
Albumin: 4.1 g/dL (ref 3.5–5.0)
Alkaline Phosphatase: 56 U/L (ref 38–126)
Anion gap: 10 (ref 5–15)
BUN: 14 mg/dL (ref 6–20)
CO2: 24 mmol/L (ref 22–32)
Calcium: 9.1 mg/dL (ref 8.9–10.3)
Chloride: 102 mmol/L (ref 98–111)
Creatinine, Ser: 0.95 mg/dL (ref 0.44–1.00)
GFR, Estimated: >60 mL/min (ref >60)
Glucose, Bld: 88 mg/dL (ref 70–99)
Potassium: 3.9 mmol/L (ref 3.5–5.1)
Sodium: 136 mmol/L (ref 135–145)
Total Bilirubin: 0.6 mg/dL (ref 0.3–1.2)
Total Protein: 7.8 g/dL (ref 6.5–8.1)

## 2022-04-23 LAB — PROTIME-INR
INR: 1 (ref 0.8–1.2)
Prothrombin Time: 12.7 seconds (ref 11.4–15.2)

## 2022-04-23 LAB — DIFFERENTIAL
Abs Immature Granulocytes: 0.02 10*3/uL (ref 0.00–0.07)
Basophils Absolute: 0 10*3/uL (ref 0.0–0.1)
Basophils Relative: 0 %
Eosinophils Absolute: 0.1 10*3/uL (ref 0.0–0.5)
Eosinophils Relative: 1 %
Immature Granulocytes: 0 %
Lymphocytes Relative: 46 %
Lymphs Abs: 3.5 10*3/uL (ref 0.7–4.0)
Monocytes Absolute: 0.6 10*3/uL (ref 0.1–1.0)
Monocytes Relative: 8 %
Neutro Abs: 3.5 10*3/uL (ref 1.7–7.7)
Neutrophils Relative %: 45 %

## 2022-04-23 LAB — APTT: aPTT: 28 seconds (ref 24–36)

## 2022-04-23 LAB — GLUCOSE, CAPILLARY: Glucose-Capillary: 112 mg/dL — ABNORMAL HIGH (ref 70–99)

## 2022-04-23 LAB — CBG MONITORING, ED: Glucose-Capillary: 76 mg/dL (ref 70–99)

## 2022-04-23 LAB — ETHANOL: Alcohol, Ethyl (B): <10 mg/dL (ref <10)

## 2022-04-23 MED ORDER — ASPIRIN 300 MG RE SUPP: 300.0000 mg | Freq: Every day | RECTAL | Status: DC

## 2022-04-23 MED ORDER — ASPIRIN 325 MG PO TABS
325.0000 mg | ORAL_TABLET | Freq: Every day | ORAL | Status: DC
  Administered 2022-04-23: 325 mg via ORAL
  Filled 2022-04-23: qty 1

## 2022-04-23 MED ORDER — INSULIN GLARGINE-YFGN 100 UNIT/ML ~~LOC~~ SOLN
16.0000 [IU] | Freq: Every day | SUBCUTANEOUS | Status: DC
  Administered 2022-04-23: 16 [IU] via SUBCUTANEOUS
  Filled 2022-04-23 (×2): qty 0.16

## 2022-04-23 MED ORDER — INSULIN ASPART 100 UNIT/ML IJ SOLN: 0.0000 [IU] | Freq: Three times a day (TID) | INTRAMUSCULAR | Status: DC

## 2022-04-23 MED ORDER — IOHEXOL 350 MG/ML SOLN
75.0000 mL | Freq: Once | INTRAVENOUS | Status: AC | PRN
  Administered 2022-04-23: 75 mL via INTRAVENOUS

## 2022-04-23 MED ORDER — POLYVINYL ALCOHOL 1.4 % OP SOLN
1.0000 [drp] | OPHTHALMIC | Status: DC | PRN
  Administered 2022-04-23 – 2022-04-24 (×2): 1 [drp] via OPHTHALMIC
  Filled 2022-04-23: qty 15

## 2022-04-23 MED ORDER — INSULIN ASPART 100 UNIT/ML IJ SOLN: 0.0000 [IU] | Freq: Every day | INTRAMUSCULAR | Status: DC

## 2022-04-23 MED ORDER — ENOXAPARIN SODIUM 40 MG/0.4ML IJ SOSY
40.0000 mg | PREFILLED_SYRINGE | INTRAMUSCULAR | Status: AC
  Administered 2022-04-24: 40 mg via SUBCUTANEOUS
  Filled 2022-04-23: qty 0.4

## 2022-04-23 MED ORDER — STROKE: EARLY STAGES OF RECOVERY BOOK: Freq: Once | Status: AC

## 2022-04-23 MED ORDER — SODIUM CHLORIDE 0.9 % IV SOLN: INTRAVENOUS | Status: DC

## 2022-04-23 MED ORDER — SODIUM CHLORIDE 0.9% FLUSH
3.0000 mL | Freq: Once | INTRAVENOUS | Status: AC
  Administered 2022-04-23: 3 mL via INTRAVENOUS

## 2022-04-23 MED ORDER — GADOBUTROL 1 MMOL/ML IV SOLN
8.0000 mL | Freq: Once | INTRAVENOUS | Status: AC | PRN
  Administered 2022-04-23: 8 mL via INTRAVENOUS

## 2022-04-23 NOTE — Progress Notes (Signed)
Telestroke RN note: [phone] Code stroke cart activated. EMS pre-alert. Reported LKW 1500. EMS activated for facial droop, left sided weakness, and difficulty walking. 1635 [facility] neurologist paged. Patient taken to CT on arrival per staff.  1640 Dr. Pearlean Brownie connected to telestroke cart. 1642 Patient brought to ED room from CT. Exam started. [id number] Decision for no TNK discussed between Dr. Pearlean Brownie and patient. 1655 ED provider at bedside. Recommendations provided to EDP by Dr. Pearlean Brownie. 1658 Patient taken for MRI.

## 2022-04-23 NOTE — Progress Notes (Signed)
Code Stroke time documentation  1635 Call time 1636 Exam Started 1640 Exam finished 1640 Images sent to [facility] 1642 Exam completed in [facility] 1641 [facility] called

## 2022-04-23 NOTE — ED Provider Notes (Signed)
[facility] EMERGENCY DEPARTMENT Provider Note   CSN: [id number] Arrival date & time: 04/23/22  1620     History  Chief Complaint  Patient presents with   Facial Droop    Kewanda LASHUN RAMSEYER is a 56 y.o. female.  Pt with c/o diagnosis left Bell's Palsy last month, and today noted new left face and left arm numbness around 3 pm. Symptoms acute onset, moderate, persistent. Also noted she felt somewhat off balance, gait impaired. No ear pain, rash/lesions, tinnitus or hearing loss. No headache. No neck or back pain. No change in vision or speech.   The history is provided by the patient and medical records.       Home Medications Prior to Admission medications   Medication Sig Start Date End Date Taking? Authorizing Provider  albuterol (VENTOLIN HFA) 108 (90 Base) MCG/ACT inhaler Inhale 2 puffs into the lungs every 4 (four) hours as needed for wheezing or shortness of breath. 03/12/22   Volney American, PA-C  azithromycin (ZITHROMAX) 250 MG tablet Take first 2 tablets together, then 1 every day until finished. 03/12/22   Volney American, PA-C  blood glucose meter kit and supplies KIT Dispense based on patient and insurance preference. Use 1x a day. (FOR ICD-9 250.00, 250.01). 04/07/18   Philemon Kingdom, MD  Dulaglutide (TRULICITY) 4.5 XE/9.4MH SOPN Inject 4.5 mg as directed once a week. 04/03/21   Philemon Kingdom, MD  glucose blood (FREESTYLE LITE) test strip Use as instructed, test blood sugar once a day 06/22/18   Philemon Kingdom, MD  insulin glargine, 2 Unit Dial, (TOUJEO MAX SOLOSTAR) 300 UNIT/ML Solostar Pen Inject 22 Units into the skin at bedtime. For diabetes 04/20/22   Philemon Kingdom, MD  Insulin Pen Needle 32G X 4 MM MISC Use 1x a day 04/03/21   Philemon Kingdom, MD  Lancets MISC Use 1x a day 04/07/18   Philemon Kingdom, MD  medroxyPROGESTERone (DEPO-PROVERA) 150 MG/ML injection Inject 150 mg into the muscle every 3 (three) months.    [provider]   metFORMIN (GLUCOPHAGE-XR) 500 MG 24 hr tablet Take 2 tablets (1,000 mg total) by mouth 2 (two) times daily with a meal. 04/20/22   Philemon Kingdom, MD  nebivolol (BYSTOLIC) 10 MG tablet Take 1 tablet (10 mg total) by mouth daily. 07/15/20   Pleas Koch, NP  ondansetron (ZOFRAN-ODT) 8 MG disintegrating tablet PLACE 1 TABLET ON THE TONGUE TWICE A DAY AS NEEDED FOR NAUSEA OR VOMITING 07/15/20   Pleas Koch, NP  promethazine-dextromethorphan (PROMETHAZINE-DM) 6.25-15 MG/5ML syrup Take 5 mLs by mouth 4 (four) times daily as needed. 03/12/22   Volney American, PA-C  telmisartan (MICARDIS) 40 MG tablet Take 1 tablet (40 mg total) by mouth daily. For blood pressure. 07/15/20   Pleas Koch, NP  tirzepatide [facility]) 10 MG/0.5ML Pen Inject 10 mg into the skin once a week. 04/20/22   Philemon Kingdom, MD  venlafaxine XR (EFFEXOR-XR) 37.5 MG 24 hr capsule Take 37.5 mg by mouth daily. 11/18/20   [provider]      Allergies    Amoxicillin, Ciprofloxacin, Other, Penicillins, Sulfa antibiotics, Erythromycin, and Vicodin [hydrocodone-acetaminophen]    Review of Systems   Review of Systems  Constitutional:  Negative for chills and fever.  HENT:  Negative for trouble swallowing.   Eyes:  Negative for pain, redness and visual disturbance.  Respiratory:  Negative for cough and shortness of breath.   Cardiovascular:  Negative for chest pain.  Gastrointestinal:  Negative for  abdominal pain, nausea and vomiting.  Genitourinary:  Negative for dysuria and flank pain.  Musculoskeletal:  Negative for back pain and neck pain.  Skin:  Negative for rash.  Neurological:  Positive for numbness. Negative for headaches.  Hematological:  Does not bruise/bleed easily.  Psychiatric/Behavioral:  Negative for confusion.     Physical Exam Updated Vital Signs BP 109/83 (BP Location: Right Arm)   Pulse 86   Temp 98.1 F (36.7 C) (Oral)   Resp 20   Ht 1.524 m (5')   Wt 81.6 kg   SpO2  100%   BMI 35.15 kg/m  Physical Exam Vitals and nursing note reviewed.  Constitutional:      Appearance: Normal appearance. She is well-developed.  HENT:     Head: Atraumatic.     Left Ear: Tympanic membrane, ear canal and external ear normal.     Nose: Nose normal.     Mouth/Throat:     Mouth: Mucous membranes are moist.  Eyes:     General: No scleral icterus.    Conjunctiva/sclera: Conjunctivae normal.     Pupils: Pupils are equal, round, and reactive to light.  Neck:     Vascular: No carotid bruit.     Trachea: No tracheal deviation.  Cardiovascular:     Rate and Rhythm: Normal rate and regular rhythm.     Pulses: Normal pulses.     Heart sounds: Normal heart sounds. No murmur heard.    No friction rub. No gallop.  Pulmonary:     Effort: Pulmonary effort is normal. No respiratory distress.     Breath sounds: Normal breath sounds.  Abdominal:     General: There is no distension.     Tenderness: There is no abdominal tenderness.  Genitourinary:    Comments: No cva tenderness.  Musculoskeletal:        General: No swelling or tenderness.     Cervical back: Normal range of motion and neck supple. No rigidity. No muscular tenderness.  Skin:    General: Skin is warm and dry.     Findings: No rash.  Neurological:     Mental Status: She is alert.     Comments: Alert, speech normal. No dysarthria or aphasia. Left facial weakness including forehead, c/w Bell's Palsy. No extremity weakness noted. Subjective sl altered sensation to left face and LUE.   Psychiatric:        Mood and Affect: Mood normal.     ED Results / Procedures / Treatments   Labs (all labs ordered are listed, but only abnormal results are displayed) Results for orders placed or performed during the hospital encounter of 04/23/22  Protime-INR  Result Value Ref Range   Prothrombin Time 12.7 11.4 - 15.2 seconds   INR 1.0 0.8 - 1.2  APTT  Result Value Ref Range   aPTT 28 24 - 36 seconds  CBC  Result  Value Ref Range   WBC 7.8 4.0 - 10.5 K/uL   RBC 4.08 3.87 - 5.11 MIL/uL   Hemoglobin 12.3 12.0 - 15.0 g/dL   HCT 37.4 36.0 - 46.0 %   MCV 91.7 80.0 - 100.0 fL   MCH 30.1 26.0 - 34.0 pg   MCHC 32.9 30.0 - 36.0 g/dL   RDW 13.5 11.5 - 15.5 %   Platelets 284 150 - 400 K/uL   nRBC 0.0 0.0 - 0.2 %  Differential  Result Value Ref Range   Neutrophils Relative % 45 %   Neutro Abs 3.5 1.7 -  7.7 K/uL   Lymphocytes Relative 46 %   Lymphs Abs 3.5 0.7 - 4.0 K/uL   Monocytes Relative 8 %   Monocytes Absolute 0.6 0.1 - 1.0 K/uL   Eosinophils Relative 1 %   Eosinophils Absolute 0.1 0.0 - 0.5 K/uL   Basophils Relative 0 %   Basophils Absolute 0.0 0.0 - 0.1 K/uL   Immature Granulocytes 0 %   Abs Immature Granulocytes 0.02 0.00 - 0.07 K/uL  Comprehensive metabolic panel  Result Value Ref Range   Sodium 136 135 - 145 mmol/L   Potassium 3.9 3.5 - 5.1 mmol/L   Chloride 102 98 - 111 mmol/L   CO2 24 22 - 32 mmol/L   Glucose, Bld 88 70 - 99 mg/dL   BUN 14 6 - 20 mg/dL   Creatinine, Ser 0.95 0.44 - 1.00 mg/dL   Calcium 9.1 8.9 - 10.3 mg/dL   Total Protein 7.8 6.5 - 8.1 g/dL   Albumin 4.1 3.5 - 5.0 g/dL   AST 33 15 - 41 U/L   ALT 31 0 - 44 U/L   Alkaline Phosphatase 56 38 - 126 U/L   Total Bilirubin 0.6 0.3 - 1.2 mg/dL   GFR, Estimated >60 >60 mL/min   Anion gap 10 5 - 15  Ethanol  Result Value Ref Range   Alcohol, Ethyl (B) <10 <10 mg/dL  I-stat chem 8, ED  Result Value Ref Range   Sodium 138 135 - 145 mmol/L   Potassium 4.0 3.5 - 5.1 mmol/L   Chloride 101 98 - 111 mmol/L   BUN 12 6 - 20 mg/dL   Creatinine, Ser 1.00 0.44 - 1.00 mg/dL   Glucose, Bld 84 70 - 99 mg/dL   Calcium, Ion 1.14 (L) 1.15 - 1.40 mmol/L   TCO2 26 22 - 32 mmol/L   Hemoglobin 13.3 12.0 - 15.0 g/dL   HCT 39.0 36.0 - 46.0 %  CBG monitoring, ED  Result Value Ref Range   Glucose-Capillary 76 70 - 99 mg/dL   CT HEAD CODE STROKE WO CONTRAST  Result Date: 04/23/2022 CLINICAL DATA:  Code stroke.  Slurred speech and  facial droop EXAM: CT HEAD WITHOUT CONTRAST TECHNIQUE: Contiguous axial images were obtained from the base of the skull through the vertex without intravenous contrast. RADIATION DOSE REDUCTION: This exam was performed according to the departmental dose-optimization program which includes automated exposure control, adjustment of the mA and/or kV according to patient size and/or use of iterative reconstruction technique. COMPARISON:  CT head 12/02/2020 FINDINGS: Brain: There is no acute intracranial hemorrhage, extra-axial fluid collection, or acute infarct. Background parenchymal volume is normal. The ventricles are normal in size. Gray-white differentiation is preserved There is no mass lesion.  There is no mass effect or midline shift. Vascular: No hyperdense vessel or unexpected calcification. Skull: Normal. Negative for fracture or focal lesion. Sinuses/Orbits: The imaged paranasal sinuses are clear. The globes and orbits are unremarkable. Other: None. ASPECTS [facility] Stroke Program Early CT Score) - Ganglionic level infarction (caudate, lentiform nuclei, internal capsule, insula, M1-M3 cortex): 7 - Supraganglionic infarction (M4-M6 cortex): 3 Total score (0-10 with 10 being normal): 10 IMPRESSION: Normal noncontrast head CT. These results were called by telephone at the time of interpretation on 04/23/2022 at 4:45 pm to provider [facility] , who verbally acknowledged these results. Electronically Signed   By: Valetta Mole M.D.   On: 04/23/2022 16:47    EKG EKG Interpretation  Date/Time:  Thursday April 23 2022 16:44:03 EST Ventricular Rate:  97 PR Interval:  146 QRS Duration: 78 QT Interval:  349 QTC Calculation: 444 R Axis:   58 Text Interpretation: Sinus rhythm Low voltage, precordial leads Nonspecific T wave abnormality Confirmed by Lajean Saver [phone]) on 04/23/2022 5:33:04 PM  Radiology CT HEAD CODE STROKE WO CONTRAST  Result Date: 04/23/2022 CLINICAL DATA:  Code stroke.  Slurred  speech and facial droop EXAM: CT HEAD WITHOUT CONTRAST TECHNIQUE: Contiguous axial images were obtained from the base of the skull through the vertex without intravenous contrast. RADIATION DOSE REDUCTION: This exam was performed according to the departmental dose-optimization program which includes automated exposure control, adjustment of the mA and/or kV according to patient size and/or use of iterative reconstruction technique. COMPARISON:  CT head 12/02/2020 FINDINGS: Brain: There is no acute intracranial hemorrhage, extra-axial fluid collection, or acute infarct. Background parenchymal volume is normal. The ventricles are normal in size. Gray-white differentiation is preserved There is no mass lesion.  There is no mass effect or midline shift. Vascular: No hyperdense vessel or unexpected calcification. Skull: Normal. Negative for fracture or focal lesion. Sinuses/Orbits: The imaged paranasal sinuses are clear. The globes and orbits are unremarkable. Other: None. ASPECTS [facility] Stroke Program Early CT Score) - Ganglionic level infarction (caudate, lentiform nuclei, internal capsule, insula, M1-M3 cortex): 7 - Supraganglionic infarction (M4-M6 cortex): 3 Total score (0-10 with 10 being normal): 10 IMPRESSION: Normal noncontrast head CT. These results were called by telephone at the time of interpretation on 04/23/2022 at 4:45 pm to provider [facility] , who verbally acknowledged these results. Electronically Signed   By: Valetta Mole M.D.   On: 04/23/2022 16:47    Procedures Procedures    Medications Ordered in ED Medications  sodium chloride flush (NS) 0.9 % injection 3 mL (3 mLs Intravenous Given 04/23/22 1645)  gadobutrol (GADAVIST) 1 MMOL/ML injection 8 mL (8 mLs Intravenous Contrast Given 04/23/22 1719)    ED Course/ Medical Decision Making/ A&P                           Medical Decision Making Problems Addressed: Left arm numbness: acute illness or injury with systemic symptoms that poses  a threat to life or bodily functions Left-sided Bell's palsy: acute illness or injury  Amount and/or Complexity of Data Reviewed External Data Reviewed: notes. Labs: ordered. Decision-making details documented in ED Course. Radiology: ordered and independent interpretation performed. Decision-making details documented in ED Course. ECG/medicine tests: ordered and independent interpretation performed. Decision-making details documented in ED Course. Discussion of management or test interpretation with external provider(s): Neurology, hospitalists  Risk Prescription drug management. Decision regarding hospitalization.   Iv ns. Continuous pulse ox and cardiac monitoring. Labs ordered/sent. Imaging ordered.   Code stroke activation. Neurology emergently consulted.   Reviewed nursing notes and prior charts for additional history. External reports reviewed. Additional history from: neurologist  Cardiac monitor: sinus rhythm, rate 84.  Labs reviewed/interpreted by me - chem normal.   CT reviewed/interpreted by me - no hem.   Neurology rec admit to medicine and MRI. Indicates not candidate for TPA.  Hospitalists consulted for admission.  CRITICAL CARE RE: acute neurologic symptoms with code stroke activation, left face and left arm numbness. Performed by: Mirna Mires Total critical care time: 40 minutes Critical care time was exclusive of separately billable procedures and treating other patients. Critical care was necessary to treat or prevent imminent or life-threatening deterioration. Critical care was time spent personally by me on the following  activities: development of treatment plan with patient and/or surrogate as well as nursing, discussions with consultants, evaluation of patient's response to treatment, examination of patient, obtaining history from patient or surrogate, ordering and performing treatments and interventions, ordering and review of laboratory studies,  ordering and review of radiographic studies, pulse oximetry and re-evaluation of patient's condition.  Neurochecks, no change in neuro exam from initial.         Final Clinical Impression(s) / ED Diagnoses Final diagnoses:  Left arm numbness  Left-sided Bell's palsy    Rx / DC Orders ED Discharge Orders     None         Lajean Saver, MD 04/23/22 1758

## 2022-04-23 NOTE — Progress Notes (Signed)
[employer], operational Provider: ER MD Consult Participants: Patient, atrium RN amanda, bedside RN and Er MD Location of the provider: [facility] Location of the patient: [name]  This consult was provided via telemedicine with 2-way video and audio communication. The patient/family was informed that care would be provided in this way and agreed to receive care in this manner.    Chief Complaint: code stroke  HPI: Melissa [name] is a 56 year old Caucasian lady with past medical history of diabetes with recent diagnosis of left-sided Bell's palsy which she calls a typical Bell's palsy 5 weeks ago for which she saw her primary care physician but had not had any brain imaging done.  She states she was in her usual state of health till 3 PM today and developed numbness involving the left upper extremity from mid biceps down as well as left side of tongue.  She denies any worsening of facial weakness, double vision, vertigo or extremity weakness.  She states that she has some difficulty walking but denies focal weakness.  She denies accompanying headache.  She has no prior history of strokes TIA seizures or significant neurological problems.  She denies any ear infection, tinnitus, hearing loss or any rashes on the face or ear.    LKW: 3 PM tpa given?: No, too mild to treat symptoms mostly numbness IR Thrombectomy? No, clinical presentation not suggestive of LVO Modified Rankin Scale: 2-Slight disability-UNABLE to perform all activities but does not need assistance Time of teleneurologist evaluation: 1640  Exam: Vitals:   04/23/22 1635  BP: 109/83  Pulse: 86  Resp: 20  Temp: 98.1 F (36.7 C)  SpO2: 100%    General: Pleasant obese middle-aged Caucasian lady not in distress.  1A: Level of Consciousness - 0 1B: Ask Month and Age - 0 1C: 'Blink Eyes' & 'Squeeze Hands' - 0 2: Test Horizontal Extraocular Movements - 0 3: Test Visual Fields -  0 4: Test Facial Palsy - 2 5A: Test Left Arm Motor Drift - 0 5B: Test Right Arm Motor Drift - 0 6A: Test Left Leg Motor Drift - 0 6B: Test Right Leg Motor Drift - 0 7: Test Limb Ataxia - 0 8: Test Sensation - 1 9: Test Language/Aphasia- 0 10: Test Dysarthria - 0 11: Test Extinction/Inattention - 0  NIHSS score: 3   Imaging Reviewed: CT head noncontrast study shows no acute abnormality.  Labs reviewed in epic and pertinent values follow: None available except CBG 76 mg percent   Assessment: 56 year old lady with recent Bell's palsy on the left 5 weeks ago presents with sudden onset of subjective numbness in the left upper extremity some gait difficulty of unclear etiology.  Possibility of TIA  versus a small vessel infarct due to small vessel disease is in the differential and needs to be ruled out. Patient's objective deficits are too mild to treat with thrombolysis and the left facial weakness is old .Patient has not had neuroimaging done to see also the presence of brainstem infarct.  I had a long discussion with the patient about risk benefit of thrombolysis due to her relatively mild new symptoms and deficits I do not believe IV thrombolysis is indicated.  Patient is in agreement. Patient is however within the time window for thrombolysis and if she has neurological worsening and new deficits may revisit issue of thrombolysis  Recommendations:  Recommend MRI scan of the brain both stroke protocol and facial nerve protocol as she has not  had neuroimaging done for Bell's palsy..   Recommend aspirin for stroke prevention and if needed imaging is positive for stroke may need dual antiplatelet therapy for 3 weeks and then single agent.  Check lipid profile, hemoglobin A1c, CT angiogram of brain and neck and echocardiogram. Kindly call the telemetry neurology services for further advice after brain imaging is completed. Long discussion with patient and answered questions. Discussed with  ER MD.     Greater than 50% time during this 55-minute consultation visit were spent in counseling and coordination of care about TIA and stroke and discussion about risk benefits of IV thrombolysis and answering questions. Delia Heady, MD [facility] [phone]  If 7pm- 7am, please page neurology on call as listed in AMION.

## 2022-04-23 NOTE — ED Notes (Signed)
Patient transported to CT 

## 2022-04-23 NOTE — ED Notes (Signed)
Pt to CT by Vernell Barrier, RN.

## 2022-04-23 NOTE — ED Notes (Signed)
Per Dr. Randol Kern no further NIH assessments needed at this time due to MRI being negative for acute infarct.

## 2022-04-23 NOTE — H&P (Signed)
TRH H&P   Patient Demographics:    Melissa [name], is a 56 y.o. female  MRN: [id number]   DOB - [birth year]  Admit Date - 04/23/2022  Outpatient Primary MD for the patient is Street, Sharon Mt, MD  Referring MD/NP/PA: DR Ashok Cordia  Patient coming from: home  Chief Complaint  Patient presents with   Facial Droop      HPI:    Melissa [name]  is a 56 y.o. female, with past medical history of diabetes mellitus, sinus tachycardia, GERD, and reported recent diagnosis of Bell's palsy few weeks ago, by PCP, no imaging, without much improvement, reports worsening of symptoms today, reported at 3 PM she developed numbness involving left upper extremity from mid biceps down, as well as worsening hypersensitivity and left facial, feeling worsening left facial area, reports she noted she is drooling more when drinking fluids, denies any headache, worsening weakness, double vision, she does report some poor balance as well, no tinnitus, this prompted her to come to ED -In ED she was seen by telemetry neurology, MRI with no acute CVA, but findings in the facial nerve suspicious for Bell's palsy, there was evidence of chronic right cerebral stroke, and hospitalist consulted to admit for further work-up as recommended per neurology.     Review of systems:     A full 10 point Review of Systems was done, except as stated above, all other Review of Systems were negative.   With Past History of the following :    Past Medical History:  Diagnosis Date   Asthma    Complication of anesthesia    Diabetes mellitus without complication ([place])    Type II   Dysrhythmia    Tachycardia   GERD (gastroesophageal reflux disease)    Pneumonia    2014- 'Walking"   Shortness of breath dyspnea    with exertion   Vertigo       Past Surgical History:  Procedure Laterality Date   CHOLECYSTECTOMY  1997    COLONOSCOPY  2015   CYSTOSCOPY     age 57- stretched stem of bladder   EYE SURGERY     Baby- Strabismus   SHOULDER ARTHROSCOPY Left 11/20/2014   Procedure: ARTHROSCOPY SHOULDER WITH MUA, ROTATOR INTERVAL RELEASE;  Surgeon: Meredith Pel, MD;  Location: [facility];  Service: Orthopedics;  Laterality: Left;  LEFT SHOULDER MUA, DOA, ROTATOR INTERVAL RELEASE.   TONSILLECTOMY  2010      Social History:     Social History   Tobacco Use   Smoking status: Never   Smokeless tobacco: Never  Substance Use Topics   Alcohol use: Yes    Alcohol/week: 0.0 standard drinks of alcohol        Family History :     Family History  Adopted: Yes  Problem Relation Age of Onset   Other Other  adopted      Home Medications:   Prior to Admission medications   Medication Sig Start Date End Date Taking? Authorizing Provider  albuterol (VENTOLIN HFA) 108 (90 Base) MCG/ACT inhaler Inhale 2 puffs into the lungs every 4 (four) hours as needed for wheezing or shortness of breath. 03/12/22   Volney American, PA-C  azithromycin (ZITHROMAX) 250 MG tablet Take first 2 tablets together, then 1 every day until finished. 03/12/22   Volney American, PA-C  blood glucose meter kit and supplies KIT Dispense based on patient and insurance preference. Use 1x a day. (FOR ICD-9 250.00, 250.01). 04/07/18   Philemon Kingdom, MD  Dulaglutide (TRULICITY) 4.5 ES/9.2ZR SOPN Inject 4.5 mg as directed once a week. 04/03/21   Philemon Kingdom, MD  glucose blood (FREESTYLE LITE) test strip Use as instructed, test blood sugar once a day 06/22/18   Philemon Kingdom, MD  insulin glargine, 2 Unit Dial, (TOUJEO MAX SOLOSTAR) 300 UNIT/ML Solostar Pen Inject 22 Units into the skin at bedtime. For diabetes 04/20/22   Philemon Kingdom, MD  Insulin Pen Needle 32G X 4 MM MISC Use 1x a day 04/03/21   Philemon Kingdom, MD  Lancets MISC Use 1x a day 04/07/18   Philemon Kingdom, MD  medroxyPROGESTERone (DEPO-PROVERA) 150  MG/ML injection Inject 150 mg into the muscle every 3 (three) months.    [provider]  metFORMIN (GLUCOPHAGE-XR) 500 MG 24 hr tablet Take 2 tablets (1,000 mg total) by mouth 2 (two) times daily with a meal. 04/20/22   Philemon Kingdom, MD  nebivolol (BYSTOLIC) 10 MG tablet Take 1 tablet (10 mg total) by mouth daily. 07/15/20   Pleas Koch, NP  ondansetron (ZOFRAN-ODT) 8 MG disintegrating tablet PLACE 1 TABLET ON THE TONGUE TWICE A DAY AS NEEDED FOR NAUSEA OR VOMITING 07/15/20   Pleas Koch, NP  promethazine-dextromethorphan (PROMETHAZINE-DM) 6.25-15 MG/5ML syrup Take 5 mLs by mouth 4 (four) times daily as needed. 03/12/22   Volney American, PA-C  telmisartan (MICARDIS) 40 MG tablet Take 1 tablet (40 mg total) by mouth daily. For blood pressure. 07/15/20   Pleas Koch, NP  tirzepatide [facility]) 10 MG/0.5ML Pen Inject 10 mg into the skin once a week. 04/20/22   Philemon Kingdom, MD  venlafaxine XR (EFFEXOR-XR) 37.5 MG 24 hr capsule Take 37.5 mg by mouth daily. 11/18/20   [provider]     Allergies:     Allergies  Allergen Reactions   Amoxicillin Anaphylaxis and Other (See Comments)    Has patient had a PCN reaction causing immediate rash, facial/tongue/throat swelling, SOB or lightheadedness with hypotension: Yes Has patient had a PCN reaction causing severe rash involving mucus membranes or skin necrosis: No Has patient had a PCN reaction that required hospitalization No Has patient had a PCN reaction occurring within the last 10 years: No If all of the above answers are "NO", then may proceed with Cephalosporin use.   Ciprofloxacin Anaphylaxis    Other reaction(s): anaphylaxis   Other Anaphylaxis    Pt states that she can only take Avelox, and Zithromax.     Penicillins Anaphylaxis and Other (See Comments)    Has patient had a PCN reaction causing immediate rash, facial/tongue/throat swelling, SOB or lightheadedness with hypotension: Yes Has  patient had a PCN reaction causing severe rash involving mucus membranes or skin necrosis: No Has patient had a PCN reaction that required hospitalization No Has patient had a PCN reaction occurring within the last 10 years: No  If all of the above answers are "NO", then may proceed with Cephalosporin use.   Sulfa Antibiotics Anaphylaxis and Other (See Comments)   Erythromycin Other (See Comments)    GI upset with oral regimen over 10 years ago Other reaction(s): anaphylaxis   Vicodin [Hydrocodone-Acetaminophen] Itching     Physical Exam:   Vitals  Blood pressure 92/62, pulse 81, temperature 98.1 F (36.7 C), temperature source Oral, resp. rate 11, height 5' (1.524 m), weight 81.6 kg, SpO2 100 %.   1. General developed female, lying in bed in NAD,    2. Normal affect and insight, Not Suicidal or Homicidal, Awake Alert, Oriented X 3.  3.  Patient with left facial droop including forehead eyebrows, Strength 5/5 all 4 extremities, Sensation intact all 4 extremities, Plantars down going.  4. Ears and Eyes appear Normal, Conjunctivae clear, PERRLA. Moist Oral Mucosa.  5. Supple Neck, No JVD, No cervical lymphadenopathy appriciated, No Carotid Bruits.  6. Symmetrical Chest wall movement, Good air movement bilaterally, CTAB.  7. RRR, No Gallops, Rubs or Murmurs, No Parasternal Heave.  8. Positive Bowel Sounds, Abdomen Soft, No tenderness, No organomegaly appriciated,No rebound -guarding or rigidity.  9.  No Cyanosis, Normal Skin Turgor, No Skin Rash or Bruise.  10. Good muscle tone,  joints appear normal , no effusions, Normal ROM.    Data Review:    CBC Recent Labs  Lab 04/23/22 1636 04/23/22 1649  WBC 7.8  --   HGB 12.3 13.3  HCT 37.4 39.0  PLT 284  --   MCV 91.7  --   MCH 30.1  --   MCHC 32.9  --   RDW 13.5  --   LYMPHSABS 3.5  --   MONOABS 0.6  --   EOSABS 0.1  --   BASOSABS 0.0  --     ------------------------------------------------------------------------------------------------------------------  Chemistries  Recent Labs  Lab 04/23/22 1636 04/23/22 1649  NA 136 138  K 3.9 4.0  CL 102 101  CO2 24  --   GLUCOSE 88 84  BUN 14 12  CREATININE 0.95 1.00  CALCIUM 9.1  --   AST 33  --   ALT 31  --   ALKPHOS 56  --   BILITOT 0.6  --    ------------------------------------------------------------------------------------------------------------------ estimated creatinine clearance is 60.8 mL/min (by C-G formula based on SCr of 1 mg/dL). ------------------------------------------------------------------------------------------------------------------ No results for input(s): "TSH", "T4TOTAL", "T3FREE", "THYROIDAB" in the last 72 hours.  Invalid input(s): "FREET3"  Coagulation profile Recent Labs  Lab 04/23/22 1636  INR 1.0   ------------------------------------------------------------------------------------------------------------------- No results for input(s): "DDIMER" in the last 72 hours. -------------------------------------------------------------------------------------------------------------------  Cardiac Enzymes No results for input(s): "CKMB", "TROPONINI", "MYOGLOBIN" in the last 168 hours.  Invalid input(s): "CK" ------------------------------------------------------------------------------------------------------------------ No results found for: "BNP"   ---------------------------------------------------------------------------------------------------------------  Urinalysis    Component Value Date/Time   COLORURINE YELLOW 12/26/2019 0510   APPEARANCEUR CLEAR 12/26/2019 0510   LABSPEC >1.046 (H) 12/26/2019 0510   PHURINE 5.0 12/26/2019 0510   GLUCOSEU >=500 (A) 12/26/2019 0510   HGBUR NEGATIVE 12/26/2019 0510   BILIRUBINUR NEGATIVE 12/26/2019 0510   KETONESUR NEGATIVE 12/26/2019 0510   PROTEINUR NEGATIVE 12/26/2019 0510    UROBILINOGEN 0.2 06/15/2010 1254   NITRITE NEGATIVE 12/26/2019 0510   LEUKOCYTESUR MODERATE (A) 12/26/2019 0510    ----------------------------------------------------------------------------------------------------------------   Imaging Results:    MR BRAIN W WO CONTRAST  Result Date: 04/23/2022 CLINICAL DATA:  Stroke follow-up EXAM: MRI HEAD WITHOUT AND WITH CONTRAST TECHNIQUE: Multiplanar, multiecho pulse sequences of  the brain and surrounding structures were obtained without and with intravenous contrast. CONTRAST:  7m GADAVIST GADOBUTROL 1 MMOL/ML IV SOLN COMPARISON:  Same-day CT head. FINDINGS: Brain: Chronic right cerebellar infarct. No hemorrhage. No acute infarct. No extra-axial fluid collection. No hydrocephalus. There is asymmetric contrast enhancement at the distal labyrinthine segment, genu, and the tympanic segment of the left facial nerve. Vascular: Absent flow void in the V3 segment of the right vertebral artery (series 14, image 1). Of note the right vertebral artery was likely noncontrast opacify on prior CT dissection study dated 12/26/2019. Skull and upper cervical spine: Normal marrow signal. Sinuses/Orbits: Trace right-sided mastoid effusion. Paranasal sinuses are clear. Other: None IMPRESSION: 1. Negative for acute infarct. 2. Asymmetric contrast enhancement at the genu and the tympanic segment of the left facial nerve could represent findings related to Bell's palsy. 3. Chronic right cerebellar infarct. 4. 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 Of note the right vertebral artery was likely not contrast opacified on prior CT dissection study dated 12/26/2019. Electronically Signed   By: HMarin RobertsM.D.   On: 04/23/2022 17:56   CT HEAD CODE STROKE WO CONTRAST  Result Date: 04/23/2022 CLINICAL DATA:  Code stroke.  Slurred speech and facial droop EXAM: CT HEAD WITHOUT CONTRAST TECHNIQUE: Contiguous axial images were obtained  from the base of the skull through the vertex without intravenous contrast. RADIATION DOSE REDUCTION: This exam was performed according to the departmental dose-optimization program which includes automated exposure control, adjustment of the mA and/or kV according to patient size and/or use of iterative reconstruction technique. COMPARISON:  CT head 12/02/2020 FINDINGS: Brain: There is no acute intracranial hemorrhage, extra-axial fluid collection, or acute infarct. Background parenchymal volume is normal. The ventricles are normal in size. Gray-white differentiation is preserved There is no mass lesion.  There is no mass effect or midline shift. Vascular: No hyperdense vessel or unexpected calcification. Skull: Normal. Negative for fracture or focal lesion. Sinuses/Orbits: The imaged paranasal sinuses are clear. The globes and orbits are unremarkable. Other: None. ASPECTS ([facility]) - Ganglionic level infarction (caudate, lentiform nuclei, internal capsule, insula, M1-M3 cortex): 7 - Supraganglionic infarction (M4-M6 cortex): 3 Total score (0-10 with 10 being normal): 10 IMPRESSION: Normal noncontrast head CT. These results were called by telephone at the time of interpretation on 04/23/2022 at 4:45 pm to [facility], who verbally acknowledged these results. Electronically Signed   By: PValetta MoleM.D.   On: 04/23/2022 16:47    My personal review of EKG: Rhythm NSR, PR interval 146 ms QRS duration 78 ms QT/QTcB 349/444 ms P-R-T axes 70 58 29 Sinus rhythm   Assessment & Plan:    Principal Problem:   Focal neurological deficit Active Problems:   Type 2 diabetes mellitus (HCC)   GERD (gastroesophageal reflux disease)   Dyslipidemia   Bell's palsy   History of CVA (cerebrovascular accident)    Focal neurological deficit Bell's palsy -Patient with Bell's palsy diagnosed 6 weeks ago, presents with worsening left facial droop, hypersensitivity and left arm  tingling/numbness -MRI brain with no evidence of acute CVA, but significant for chronic right cerebellar CVA, neurology input greatly appreciated, will proceed with CVA work-up including CTA head and neck, 2D echo, and telemetry monitoring at night, will check A1c and lipid panel as well -Consult PT/OT/SLP -MRI findings significant for asymmetric contrast-enhancement at the genu and the tympanic segment of the left facial nerve  which could represent findings related to Bell's palsy -MRI brain findings 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  But she is covering it during sleep and using ointment daytime for lubrication.  History of CVA -I finding significant for chronic right cerebral CVA, she was unaware of that in the past. -Start on aspirin, likely will need statin as well, given she is diabetic with risk factors, will check LDL this morning, and she should be started on statin before discharge  Diabetes mellitus -We will hold Mounjaro on metformin, will resume on Semglee and instead of Toujeo and will add insulin sliding scale -She is on Micardis for nephro protective effect from diabetes, given soft blood pressure will hold for now  Sinus tachycardia - Known history of sinus tachycardia in the past, continue with Bystolic   GERD - Continue with PPI  Dyslipidemia -Presents in the past medical history, but patient reports her LDL has always been controlled, will check   DVT Prophylaxis   Lovenox   AM Labs Ordered, also please review Full Orders  Family Communication: Admission, patients condition and plan of care including tests being  ordered have been discussed with the patient  who indicate understanding and agree with the plan and Code Status.  Code Status Full  Likely DC to  home  Condition GUARDED    Consults called: neurology    Admission status: observation    Time spent in minutes : 70 minutes   Phillips Climes M.D on 04/23/2022 at 6:27 PM   [facility] - Office  [phone]

## 2022-04-23 NOTE — ED Notes (Signed)
Dr. Denton Lank made aware of pt's symptoms and possible Code Stroke. MD said to go ahead with Code Stroke and he would evaluate pt when she returned from CT.

## 2022-04-23 NOTE — ED Notes (Signed)
Pt in MRI.

## 2022-04-23 NOTE — ED Triage Notes (Signed)
Pt via POV reporting left-sided facial droop with hx of "atypical Bells palsy." This afternoon at around 3pm pt also noticed slurred speech, left arm numbness, headache, difficulty swallowing, and altered balance. Pt is a/o x 4 on arrival; obvious facial droop and some slurred speech noted. MD notified.

## 2022-04-24 ENCOUNTER — Observation Stay (HOSPITAL_BASED_OUTPATIENT_CLINIC_OR_DEPARTMENT_OTHER)

## 2022-04-24 ENCOUNTER — Encounter (HOSPITAL_COMMUNITY): Payer: Self-pay | Admitting: Internal Medicine

## 2022-04-24 DIAGNOSIS — G459 Transient cerebral ischemic attack, unspecified: Secondary | ICD-10-CM

## 2022-04-24 DIAGNOSIS — R29818 Other symptoms and signs involving the nervous system: Secondary | ICD-10-CM | POA: Diagnosis not present

## 2022-04-24 LAB — GLUCOSE, CAPILLARY: Glucose-Capillary: 76 mg/dL (ref 70–99)

## 2022-04-24 LAB — ECHOCARDIOGRAM COMPLETE
AR max vel: 1.98 cm2
AV Area VTI: 2.01 cm2
AV Area mean vel: 1.88 cm2
AV Mean grad: 4 mmHg
AV Peak grad: 8 mmHg
Ao pk vel: 1.41 m/s
Area-P 1/2: 5.31 cm2
Height: 60 in
MV VTI: 2.49 cm2
S' Lateral: 2.7 cm
Weight: 2913.6 oz

## 2022-04-24 LAB — LIPID PANEL
Cholesterol: 130 mg/dL (ref 0–200)
HDL: 44 mg/dL (ref >40)
LDL Cholesterol: 68 mg/dL (ref 0–99)
Total CHOL/HDL Ratio: 3 RATIO
Triglycerides: 91 mg/dL (ref <150)
VLDL: 18 mg/dL (ref 0–40)

## 2022-04-24 LAB — HEMOGLOBIN A1C
Hgb A1c MFr Bld: 5 % (ref 4.8–5.6)
Mean Plasma Glucose: 96.8 mg/dL

## 2022-04-24 LAB — HIV ANTIBODY (ROUTINE TESTING W REFLEX): HIV Screen 4th Generation wRfx: NONREACTIVE

## 2022-04-24 MED ORDER — ASPIRIN 325 MG PO TABS: 325.0000 mg | ORAL_TABLET | Freq: Every day | ORAL | 0 refills | Status: DC

## 2022-04-24 NOTE — Discharge Summary (Addendum)
Physician Discharge Summary   Patient: Melissa [name] MRN: [id number] DOB: 08-21-1965  Admit date:     04/23/2022  Discharge date: 04/24/22  Discharge Physician: Deatra James   PCP: Street, Sharon [place], MD   Recommendations at discharge:   Continue current medication, continue current diabetic diet and medication Per neurology initiated aspirin Follow-up with neurology for persistent symptomatic Bell's palsy Follow-up with ophthalmologist  Discharge Diagnoses: Principal Problem:   Focal neurological deficit Active Problems:   Type 2 diabetes mellitus ([place])   GERD (gastroesophageal reflux disease)   Dyslipidemia   Bell's palsy   History of CVA (cerebrovascular accident)  Resolved Problems:   * No resolved hospital problems. *   Melissa [name]  is a 56 y.o. female, with past medical history of diabetes mellitus, sinus tachycardia, GERD, and reported recent diagnosis of Bell's palsy few weeks ago, by PCP, no imaging, without much improvement, reports worsening of symptoms today, reported at 3 PM she developed numbness involving left upper extremity from mid biceps down, as well as worsening hypersensitivity and left facial, feeling worsening left facial area, reports she noted she is drooling more when drinking fluids, denies any headache, worsening weakness, double vision, she does report some poor balance as well, no tinnitus, this prompted her to come to ED -In ED she was seen by telemetry neurology, MRI with no acute CVA, but findings in the facial nerve suspicious for Bell's palsy, there was evidence of chronic right cerebral stroke, and hospitalist consulted to admit for further work-up as recommended per neurology.  Focal neurological deficit Bell's palsy -Patient with Bell's palsy diagnosed 6 weeks ago, presents with worsening left facial droop, hypersensitivity and left arm tingling/numbness -MRI brain with no evidence of acute CVA, but significant for chronic right cerebellar  CVA, neurology input greatly appreciated,  -  CVA work-up including  were all  CTA head and neck: Negative for intracranial large vessel occlusion  -A1c 5.0 -LDL 68 -2D echocardiogram: Reviewed within normal limits with a normal ejection fraction EJF 55 to 60%. The left ventricle has normal function. The left ventricle has no regional wall motion abnormalities. Left ventricular diastolic parameters were normal. 2. 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  But she is covering it during sleep and using ointment daytime for lubrication.   History of CVA -I finding significant for chronic right cerebral CVA, she was unaware of that in the past. -Start on aspirin,  -No need for statins as her LDL is within goal 68    Diabetes mellitus -Resume home medication - Mounjaro on metformin,  Toujeo a -She is on Micardis for nephro protective effect from diabetes,   Sinus tachycardia - Known history of sinus tachycardia in the past, continue with Bystolic    GERD - Continue with PPI   Dyslipidemia -Presents in the past medical history, but patient reports her LDL has always been controlled,  Lipid Panel     Component Value Date/Time   CHOL 130 04/24/2022 0404   TRIG 91 04/24/2022 0404   HDL 44  04/24/2022 0404   CHOLHDL 3.0 04/24/2022 0404   VLDL 18 04/24/2022 0404   LDLCALC 68 04/24/2022 0404   LDLDIRECT 125.0 06/08/2019 0908    Consultants: Teleneurology Procedures  performed: MRI of the brain, 2D echocardiogram Disposition: Home Diet recommendation:  Discharge Diet Orders (From admission, onward)     Start     Ordered   04/24/22 0000  Diet - low sodium heart healthy        04/24/22 1133           Cardiac and Carb modified diet DISCHARGE MEDICATION: Allergies as of 04/24/2022       Reactions   Amoxicillin Anaphylaxis, Other (See Comments)   Has patient had a PCN reaction causing immediate rash, facial/tongue/throat swelling, SOB or lightheadedness with hypotension: Yes Has patient had a PCN reaction causing severe rash involving mucus membranes or skin necrosis: No Has patient had a PCN reaction that required hospitalization No Has patient had a PCN reaction occurring within the last 10 years: No If all of the above answers are "NO", then may proceed with Cephalosporin use.   Ciprofloxacin Anaphylaxis   Other reaction(s): anaphylaxis   Other Anaphylaxis   Pt states that she can only take Avelox, and Zithromax.     Penicillins Anaphylaxis, Other (See Comments)   Has patient had a PCN reaction causing immediate rash, facial/tongue/throat swelling, SOB or lightheadedness with hypotension: Yes Has patient had a PCN reaction causing severe rash involving mucus membranes or skin necrosis: No Has patient had a PCN reaction that required hospitalization No Has patient had a PCN reaction occurring within the last 10 years: No If all of the above answers are "NO", then may proceed with Cephalosporin use.   Sulfa Antibiotics Anaphylaxis, Other (See Comments)   Erythromycin Other (See Comments)   GI upset with oral regimen over 10 years ago Other reaction(s): anaphylaxis   Vicodin [hydrocodone-acetaminophen] Itching        Medication List     STOP taking these medications    promethazine-dextromethorphan 6.25-15 MG/5ML syrup Commonly known as: PROMETHAZINE-DM       TAKE these medications    albuterol 108 (90 Base) MCG/ACT  inhaler Commonly known as: VENTOLIN HFA Inhale 2 puffs into the lungs every 4 (four) hours as needed for wheezing or shortness of breath.   aspirin 325 MG tablet Take 1 tablet (325 mg total) by mouth daily.   blood glucose meter kit and supplies Kit Dispense based on patient and insurance preference. Use 1x a day. (FOR ICD-9 250.00, 250.01).   gabapentin 300 MG capsule Commonly known as: NEURONTIN Take 300 mg by mouth 3 (three) times daily.   glucose blood test strip Commonly known as: FREESTYLE LITE Use as instructed, test blood sugar once a day   Insulin Pen Needle 32G X 4 MM Misc Use 1x a day   Lancets Misc Use 1x a day   lidocaine 5 % Commonly known as: LIDODERM Place 1 patch onto the skin daily.   metFORMIN 500 MG 24 hr tablet Commonly known as: GLUCOPHAGE-XR Take 2 tablets (1,000 mg total) by mouth 2 (two) times daily with a meal.   nebivolol 10 MG tablet Commonly known as: Bystolic Take 1 tablet (10 mg total) by mouth daily.   ondansetron 8 MG disintegrating tablet Commonly known as: ZOFRAN-ODT PLACE 1 TABLET ON THE TONGUE TWICE A DAY AS NEEDED FOR NAUSEA OR VOMITING   telmisartan 40 MG tablet Commonly known as: Micardis Take 1 tablet (40 mg  total) by mouth daily. For blood pressure.   tirzepatide 10 MG/0.5ML Pen Commonly known as: MOUNJARO Inject 10 mg into the skin once a week.   Toujeo Max SoloStar 300 UNIT/ML Solostar Pen Generic drug: insulin glargine (2 Unit Dial) Inject 22 Units into the skin at bedtime. For diabetes What changed: how much to take   venlafaxine XR 37.5 MG 24 hr capsule Commonly known as: EFFEXOR-XR Take 37.5 mg by mouth daily.        Discharge Exam: Filed Weights   04/23/22 1634 04/23/22 2215  Weight: 81.6 kg 82.6 kg      Physical Exam:   General:  AAO x 3,  cooperative, no distress;   HEENT:  Significant facial asymmetry with some numbness Otherwise Normocephalic, PERRL,   Neuro:  Left cranial nerve VII  deficiency noted with asymmetric facial features otherwise CNs are intact. , normal motor and sensation, reflexes intact  Negative for any pronator drift,  Lungs:   Clear to auscultation BL, Respirations unlabored,  No wheezes / crackles  Cardio:    S1/S2, RRR, No murmure, No Rubs or Gallops   Abdomen:  Soft, non-tender, bowel sounds active all four quadrants, no guarding or peritoneal signs.  Muscular  skeletal:  Limited exam -global generalized weaknesses - in bed, able to move all 4 extremities,   2+ pulses,  symmetric, No pitting edema  Skin:  Dry, warm to touch, negative for any Rashes,  Wounds: Please see nursing documentation          Condition at discharge: good  The results of significant diagnostics from this hospitalization (including imaging, microbiology, ancillary and laboratory) are listed below for reference.   Imaging Studies: CT ANGIO HEAD NECK W WO CM  Result Date: 04/23/2022 CLINICAL DATA:  TIA.  Slurred speech, left facial droop EXAM: CT ANGIOGRAPHY HEAD AND NECK TECHNIQUE: Multidetector CT imaging of the head and neck was performed using the standard protocol during bolus administration of intravenous contrast. Multiplanar CT image reconstructions and MIPs were obtained to evaluate the vascular anatomy. Carotid stenosis measurements (when applicable) are obtained utilizing NASCET criteria, using the distal internal carotid diameter as the denominator. RADIATION DOSE REDUCTION: This exam was performed according to the departmental dose-optimization program which includes automated exposure control, adjustment of the mA and/or kV according to patient size and/or use of iterative reconstruction technique. CONTRAST:  61m OMNIPAQUE IOHEXOL 350 MG/ML SOLN COMPARISON:  CT head and MRI head 04/23/2022 FINDINGS: CTA NECK FINDINGS Aortic arch: Standard branching. Imaged portion shows no evidence of aneurysm or dissection. No significant stenosis of the major arch vessel  origins. Right carotid system: Right common carotid artery widely patent. Right carotid bifurcation widely patent with minimal atherosclerotic disease. Right internal carotid artery widely patent Left carotid system: Left common carotid artery widely patent. Atherosclerotic disease left carotid bulb with 25% diameter stenosis. Remainder of the left internal carotid artery widely patent Vertebral arteries: Left vertebral artery dominant. Both vertebral arteries patent to the skull base without stenosis. Skeleton: Negative Other neck: Negative for mass or adenopathy in the neck. Upper chest: Lung apices clear bilaterally Review of the MIP images confirms the above findings CTA HEAD FINDINGS Anterior circulation: Atherosclerotic calcification without significant stenosis in the cavernous carotid bilaterally. Anterior and middle cerebral arteries patent without stenosis or large vessel occlusion. Negative for aneurysm. Posterior circulation: Both vertebral arteries patent to the basilar. PICA patent bilaterally. Basilar widely patent. AICA, superior cerebellar, posterior cerebral arteries patent without stenosis or large vessel occlusion. Fetal  origin right posterior cerebral artery Venous sinuses: Normal venous enhancement. Anatomic variants: None Review of the MIP images confirms the above findings IMPRESSION: 1. Negative for intracranial large vessel occlusion. 2. 25% diameter stenosis left carotid bulb due to atherosclerotic disease. No significant right carotid or vertebral artery stenosis in the neck. Electronically Signed   By: Franchot Gallo M.D.   On: 04/23/2022 19:37   MR BRAIN W WO CONTRAST  Result Date: 04/23/2022 CLINICAL DATA:  Stroke follow-up EXAM: MRI HEAD WITHOUT AND WITH CONTRAST TECHNIQUE: Multiplanar, multiecho pulse sequences of the brain and surrounding structures were obtained without and with intravenous contrast. CONTRAST:  54m GADAVIST GADOBUTROL 1 MMOL/ML IV SOLN COMPARISON:  Same-day CT  head. FINDINGS: Brain: Chronic right cerebellar infarct. No hemorrhage. No acute infarct. No extra-axial fluid collection. No hydrocephalus. There is asymmetric contrast enhancement at the distal labyrinthine segment, genu, and the tympanic segment of the left facial nerve. Vascular: Absent flow void in the V3 segment of the right vertebral artery (series 14, image 1). Of note the right vertebral artery was likely noncontrast opacify on prior CT dissection study dated 12/26/2019. Skull and upper cervical spine: Normal marrow signal. Sinuses/Orbits: Trace right-sided mastoid effusion. Paranasal sinuses are clear. Other: None IMPRESSION: 1. Negative for acute infarct. 2. Asymmetric contrast enhancement at the genu and the tympanic segment of the left facial nerve could represent findings related to Bell's palsy. 3. Chronic right cerebellar infarct. 4. 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 Of note the right vertebral artery was likely not contrast opacified on prior CT dissection study dated 12/26/2019. Electronically Signed   By: HMarin RobertsM.D.   On: 04/23/2022 17:56   CT HEAD CODE STROKE WO CONTRAST  Result Date: 04/23/2022 CLINICAL DATA:  Code stroke.  Slurred speech and facial droop EXAM: CT HEAD WITHOUT CONTRAST TECHNIQUE: Contiguous axial images were obtained from the base of the skull through the vertex without intravenous contrast. RADIATION DOSE REDUCTION: This exam was performed according to the departmental dose-optimization program which includes automated exposure control, adjustment of the mA and/or kV according to patient size and/or use of iterative reconstruction technique. COMPARISON:  CT head 12/02/2020 FINDINGS: Brain: There is no acute intracranial hemorrhage, extra-axial fluid collection, or acute infarct. Background parenchymal volume is normal. The ventricles are normal in size. Gray-white differentiation is preserved There is no mass  lesion.  There is no mass effect or midline shift. Vascular: No hyperdense vessel or unexpected calcification. Skull: Normal. Negative for fracture or focal lesion. Sinuses/Orbits: The imaged paranasal sinuses are clear. The globes and orbits are unremarkable. Other: None. ASPECTS ([facility]) - Ganglionic level infarction (caudate, lentiform nuclei, internal capsule, insula, M1-M3 cortex): 7 - Supraganglionic infarction (M4-M6 cortex): 3 Total score (0-10 with 10 being normal): 10 IMPRESSION: Normal noncontrast head CT. These results were called by telephone at the time of interpretation on 04/23/2022 at 4:45 pm [facility] verbally acknowledged these results. Electronically Signed   By: PValetta MoleM.D.   On: 04/23/2022 16:47    Microbiology: Results for orders placed or performed during the hospital encounter of 12/26/19  SARS Coronavirus 2 by RT PCR ([facility] Nasopharyngeal Nasopharyngeal Swab     Status: None   Collection Time: 12/26/19  6:10 AM   Specimen: Nasopharyngeal Swab  Result Value Ref Range Status   SARS Coronavirus 2 NEGATIVE NEGATIVE Final  Comment: (NOTE) SARS-CoV-2 target nucleic acids are NOT DETECTED.  The SARS-CoV-2 RNA is generally detectable in upper and lower respiratory specimens during the acute phase of infection. The lowest concentration of SARS-CoV-2 viral copies this assay can detect is 250 copies / mL. A negative result does not preclude SARS-CoV-2 infection and should not be used as the sole basis for treatment or other patient management decisions.  A negative result may occur with improper specimen collection / handling, submission of specimen other than nasopharyngeal swab, presence of viral mutation(s) within the areas targeted by this assay, and inadequate number of viral copies (<250 copies / mL). A negative result must be combined with clinical observations,  patient history, and epidemiological information.  Fact Sheet for Patients:   StrictlyIdeas.no  Fact Sheet for Healthcare Providers: BankingDealers.co.za  This test is not yet approved or  cleared by the [place] FDA and has been authorized for detection and/or diagnosis of SARS-CoV-2 by FDA under an Emergency Use Authorization (EUA).  This EUA will remain in effect (meaning this test can be used) for the duration of the COVID-19 declaration under Section 564(b)(1) of the Act, 21 U.S.C. section 360bbb-3(b)(1), unless the authorization is terminated or revoked sooner.  Performed at [facility], [place] [address], [place], [place] [address]     Labs: CBC: Recent Labs  Lab 04/23/22 1636 04/23/22 1649  WBC 7.8  --   NEUTROABS 3.5  --   HGB 12.3 13.3  HCT 37.4 39.0  MCV 91.7  --   PLT 284  --    Basic Metabolic Panel: Recent Labs  Lab 04/23/22 1636 04/23/22 1649  NA 136 138  K 3.9 4.0  CL 102 101  CO2 24  --   GLUCOSE 88 84  BUN 14 12  CREATININE 0.95 1.00  CALCIUM 9.1  --    Liver Function Tests: Recent Labs  Lab 04/23/22 1636  AST 33  ALT 31  ALKPHOS 56  BILITOT 0.6  PROT 7.8  ALBUMIN 4.1   CBG: Recent Labs  Lab 04/23/22 1643 04/23/22 2221 04/24/22 0746  GLUCAP 76 112* 76    Discharge time spent: greater than 30 minutes.  Signed: Deatra James, MD [facility] 04/24/2022

## 2022-04-24 NOTE — Progress Notes (Signed)
  Transition of Care (TOC) Screening Note   Patient Details  Name: Melissa [name] Date of Birth: August 11, 1965   Transition of Care [facility]) CM/SW Contact:    Annice Needy, LCSW Phone Number: 04/24/2022, 9:32 AM    Transition of Care Department [facility]) has reviewed patient and no TOC needs have been identified at this time. We will continue to monitor patient advancement through interdisciplinary progression rounds. If new patient transition needs arise, please place a TOC consult.

## 2022-04-24 NOTE — Progress Notes (Signed)
Nsg Discharge Note  Admit Date:  04/23/2022 Discharge date: 04/24/2022   Melissa [name] to be D/C'd Home per MD order.  AVS completed.   Patient/caregiver able to verbalize understanding.  Discharge Medication: Allergies as of 04/24/2022       Reactions   Amoxicillin Anaphylaxis, Other (See Comments)   Has patient had a PCN reaction causing immediate rash, facial/tongue/throat swelling, SOB or lightheadedness with hypotension: Yes Has patient had a PCN reaction causing severe rash involving mucus membranes or skin necrosis: No Has patient had a PCN reaction that required hospitalization No Has patient had a PCN reaction occurring within the last 10 years: No If all of the above answers are "NO", then may proceed with Cephalosporin use.   Ciprofloxacin Anaphylaxis   Other reaction(s): anaphylaxis   Other Anaphylaxis   Pt states that she can only take Avelox, and Zithromax.     Penicillins Anaphylaxis, Other (See Comments)   Has patient had a PCN reaction causing immediate rash, facial/tongue/throat swelling, SOB or lightheadedness with hypotension: Yes Has patient had a PCN reaction causing severe rash involving mucus membranes or skin necrosis: No Has patient had a PCN reaction that required hospitalization No Has patient had a PCN reaction occurring within the last 10 years: No If all of the above answers are "NO", then may proceed with Cephalosporin use.   Sulfa Antibiotics Anaphylaxis, Other (See Comments)   Erythromycin Other (See Comments)   GI upset with oral regimen over 10 years ago Other reaction(s): anaphylaxis   Vicodin [hydrocodone-acetaminophen] Itching        Medication List     STOP taking these medications    promethazine-dextromethorphan 6.25-15 MG/5ML syrup Commonly known as: PROMETHAZINE-DM       TAKE these medications    albuterol 108 (90 Base) MCG/ACT inhaler Commonly known as: VENTOLIN HFA Inhale 2 puffs into the lungs every 4 (four) hours as  needed for wheezing or shortness of breath.   aspirin 325 MG tablet Take 1 tablet (325 mg total) by mouth daily.   blood glucose meter kit and supplies Kit Dispense based on patient and insurance preference. Use 1x a day. (FOR ICD-9 250.00, 250.01).   gabapentin 300 MG capsule Commonly known as: NEURONTIN Take 300 mg by mouth 3 (three) times daily.   glucose blood test strip Commonly known as: FREESTYLE LITE Use as instructed, test blood sugar once a day   Insulin Pen Needle 32G X 4 MM Misc Use 1x a day   Lancets Misc Use 1x a day   lidocaine 5 % Commonly known as: LIDODERM Place 1 patch onto the skin daily.   metFORMIN 500 MG 24 hr tablet Commonly known as: GLUCOPHAGE-XR Take 2 tablets (1,000 mg total) by mouth 2 (two) times daily with a meal.   nebivolol 10 MG tablet Commonly known as: Bystolic Take 1 tablet (10 mg total) by mouth daily.   ondansetron 8 MG disintegrating tablet Commonly known as: ZOFRAN-ODT PLACE 1 TABLET ON THE TONGUE TWICE A DAY AS NEEDED FOR NAUSEA OR VOMITING   telmisartan 40 MG tablet Commonly known as: Micardis Take 1 tablet (40 mg total) by mouth daily. For blood pressure.   tirzepatide 10 MG/0.5ML Pen Commonly known as: MOUNJARO Inject 10 mg into the skin once a week.   Toujeo Max SoloStar 300 UNIT/ML Solostar Pen Generic drug: insulin glargine (2 Unit Dial) Inject 22 Units into the skin at bedtime. For diabetes What changed: how much to take   venlafaxine XR  37.5 MG 24 hr capsule Commonly known as: EFFEXOR-XR Take 37.5 mg by mouth daily.        Discharge Assessment: Vitals:   04/24/22 0626 04/24/22 0805  BP: 101/60 112/78  Pulse: 84   Resp:  20  Temp: 97.9 F (36.6 C) 98.4 F (36.9 C)  SpO2: 98% 98%   Skin clean, dry and intact without evidence of skin break down, no evidence of skin tears noted. IV catheter discontinued intact. Site without signs and symptoms of complications - no redness or edema noted at insertion  site, patient denies c/o pain - only slight tenderness at site.  Dressing with slight pressure applied.  D/c Instructions-Education: Discharge instructions given to patient/family with verbalized understanding. D/c education completed with patient/family including follow up instructions, medication list, d/c activities limitations if indicated, with other d/c instructions as indicated by MD - patient able to verbalize understanding, all questions fully answered. Patient instructed to return to ED, call 911, or call MD for any changes in condition.  Patient escorted via [place], and D/C home via private auto.  Kathie Rhodes, RN 04/24/2022 11:38 AM

## 2022-04-24 NOTE — Progress Notes (Signed)
*  PRELIMINARY RESULTS* Echocardiogram 2D Echocardiogram has been performed.  Melissa [name] 04/24/2022, 9:34 AM

## 2022-04-24 NOTE — Progress Notes (Signed)
SLP Cancellation Note  Patient Details Name: Melissa [name] MRN: [id number] DOB: [birth year]   Cancelled treatment:       Reason Eval/Treat Not Completed: Patient at procedure or test/unavailable;pt was discharged prior to evaluation being completed.  Please consult HH SLP if needs continue.  Thank you for this consult.   Tressie Stalker, M.S., CCC-SLP 04/24/2022, 12:03 PM

## 2022-04-24 NOTE — Evaluation (Signed)
Occupational Therapy Evaluation Patient Details Name: BASIA MCGINTY MRN: [id number] DOB: [birth year] Today's Date: 04/24/2022   History of Present Illness Melissa [name]  is a 56 y.o. female, with past medical history of diabetes mellitus, sinus tachycardia, GERD, and reported recent diagnosis of Bell's palsy few weeks ago, by PCP, no imaging, without much improvement, reports worsening of symptoms today, reported at 3 PM she developed numbness involving left upper extremity from mid biceps down, as well as worsening hypersensitivity and left facial, feeling worsening left facial area, reports she noted she is drooling more when drinking fluids, denies any headache, worsening weakness, double vision, she does report some poor balance as well, no tinnitus, this prompted her to come to ED   Clinical Impression   Pt agreeable to OT and PT co-evaluation. Pt has been suffering from bell's palsy for several months. Pt is functionally at or near baseline. Pt is independent for mobility and ADL's. Pt reported increase in blurry vision yesterday, but normal vision today. Pt is not recommended for further acute OT services and will be discharged to care of nursing staff for remaining length of stay.      Recommendations for follow up therapy are one component of a multi-disciplinary discharge planning process, led by the attending physician.  Recommendations may be updated based on patient status, additional functional criteria and insurance authorization.   Follow Up Recommendations  No OT follow up    Assistance Recommended at Discharge None  Patient can return home with the following      Functional Status Assessment  Patient has not had a recent decline in their functional status  Equipment Recommendations  None recommended by OT    Recommendations for Other Services       Precautions / Restrictions Precautions Precautions: None Restrictions Weight Bearing Restrictions: No      Mobility Bed  Mobility                    Transfers                          Balance                                           ADL either performed or assessed with clinical judgement   ADL Overall ADL's : Independent                                             Vision Baseline Vision/History: 1 Wears glasses Ability to See in Adequate Light: 1 Impaired Patient Visual Report: Blurring of vision (Blurry vision since yesterday but has resolved.) Vision Assessment?: No apparent visual deficits     Perception     Praxis      Pertinent Vitals/Pain Pain Assessment Pain Assessment: 0-10 Pain Score: 2  Pain Location: facial nerve pain Pain Descriptors / Indicators: Other (Comment) ("nerve pain") Pain Intervention(s): Limited activity within patient's tolerance, Monitored during session     Hand Dominance Right   Extremity/Trunk Assessment Upper Extremity Assessment Upper Extremity Assessment: Overall WFL for tasks assessed   Lower Extremity Assessment Lower Extremity Assessment: Overall WFL for tasks assessed   Cervical / Trunk Assessment Cervical / Trunk Assessment: Normal   Communication Communication  Communication: No difficulties                                                                 Home Living Family/patient expects to be discharged to:: Private residence Living Arrangements: Spouse/significant other Available Help at Discharge: Family Type of Home: House Home Access: Stairs to enter Technical brewer of Steps: 3 Entrance Stairs-Rails: Right;Left;Can reach both Home Layout: Two level Alternate Level Stairs-Number of Steps: 13 Alternate Level Stairs-Rails: Right;Left;Can reach both Bathroom Shower/Tub: Teacher, early years/pre: Standard Bathroom Accessibility: Yes   Home Equipment: None          Prior Functioning/Environment Prior Level of Function :  Independent/Modified Independent;Driving;Working/employed                                      OT Goals(Current goals can be found in the care plan section) Acute Rehab OT Goals Patient Stated Goal: return home  OT Frequency:      Co-evaluation PT/OT/SLP Co-Evaluation/Treatment: Yes Reason for Co-Treatment: To address functional/ADL transfers PT goals addressed during session: Mobility/safety with mobility;Balance;Strengthening/ROM OT goals addressed during session: ADL's and self-care      AM-PAC OT "6 Clicks" Daily Activity     Outcome Measure Help from another person eating meals?: None Help from another person taking care of personal grooming?: None Help from another person toileting, which includes using toliet, bedpan, or urinal?: None Help from another person bathing (including washing, rinsing, drying)?: None Help from another person to put on and taking off regular upper body clothing?: None Help from another person to put on and taking off regular lower body clothing?: None 6 Click Score: 24   End of Session    Activity Tolerance: Patient tolerated treatment well Patient left: with call bell/phone within reach;in chair  OT Visit Diagnosis: Other symptoms and signs involving the nervous system [id number])                [id number] OT Time Calculation (min): 10 min Charges:  OT General Charges $OT Visit: 1 Visit OT Evaluation $OT Eval Low Complexity: 1 Low  Tennis Mckinnon OT, MOT  Larey Seat 04/24/2022, 9:34 AM

## 2022-04-24 NOTE — Progress Notes (Signed)
Patient a/ox4 on arrival to floor. L facial droop noted. IV fluids started. mNIH/vitals q2. Per patient L side of face is very hypersensitive at times. Early stages of recovery book given.

## 2022-04-24 NOTE — Evaluation (Signed)
Physical Therapy Evaluation Patient Details Name: Melissa [name] MRN: [id number] DOB: September 24, 1965 Today's Date: 04/24/2022  History of Present Illness  Melissa [name]  is a 56 y.o. female, with past medical history of diabetes mellitus, sinus tachycardia, GERD, and reported recent diagnosis of Bell's palsy few weeks ago, by PCP, no imaging, without much improvement, reports worsening of symptoms today, reported at 3 PM she developed numbness involving left upper extremity from mid biceps down, as well as worsening hypersensitivity and left facial, feeling worsening left facial area, reports she noted she is drooling more when drinking fluids, denies any headache, worsening weakness, double vision, she does report some poor balance as well, no tinnitus, this prompted her to come to ED   Clinical Impression  Patient functioning at baseline for mobility and gait. She does not have LE strength or sensation deficit and completes all mobility independently. Patient ambulates without AD without loss of balance and ends session seated in chair. Patient discharged to care of nursing for ambulation daily as tolerated for length of stay.        Recommendations for follow up therapy are one component of a multi-disciplinary discharge planning process, led by the attending physician.  Recommendations may be updated based on patient status, additional functional criteria and insurance authorization.  Follow Up Recommendations No PT follow up      Assistance Recommended at Discharge None  Patient can return home with the following       Equipment Recommendations None recommended by PT  Recommendations for Other Services       Functional Status Assessment Patient has had a recent decline in their functional status and demonstrates the ability to make significant improvements in function in a reasonable and predictable amount of time.     Precautions / Restrictions Precautions Precautions:  None Restrictions Weight Bearing Restrictions: No      Mobility  Bed Mobility Overal bed mobility: Independent                  Transfers Overall transfer level: Independent Equipment used: None                    Ambulation/Gait Ambulation/Gait assistance: Independent Gait Distance (Feet): 200 Feet Assistive device: None Gait Pattern/deviations: WFL(Within Functional Limits)          Stairs            Wheelchair Mobility    Modified Rankin (Stroke Patients Only)       Balance Overall balance assessment: Independent                                           Pertinent Vitals/Pain Pain Assessment Pain Assessment: 0-10 Pain Score: 2  Pain Location: facial nerve pain Pain Descriptors / Indicators: Other (Comment) ("nerve pain") Pain Intervention(s): Limited activity within patient's tolerance, Monitored during session, Repositioned    Home Living Family/patient expects to be discharged to:: Private residence Living Arrangements: Spouse/significant other Available Help at Discharge: Family Type of Home: House Home Access: Stairs to enter Entrance Stairs-Rails: Right;Left;Can reach both Entrance Stairs-Number of Steps: 3 Alternate Level Stairs-Number of Steps: 13 Home Layout: Two level Home Equipment: None      Prior Function Prior Level of Function : Independent/Modified Independent;Driving;Working/employed                     Hand Dominance  Dominant Hand: Right    Extremity/Trunk Assessment   Upper Extremity Assessment Upper Extremity Assessment: Defer to OT evaluation    Lower Extremity Assessment Lower Extremity Assessment: Overall WFL for tasks assessed    Cervical / Trunk Assessment Cervical / Trunk Assessment: Normal  Communication   Communication: No difficulties  Cognition Arousal/Alertness: Awake/alert Behavior During Therapy: WFL for tasks assessed/performed Overall Cognitive  Status: Within Functional Limits for tasks assessed                                          General Comments      Exercises     Assessment/Plan    PT Assessment Patient does not need any further PT services  PT Problem List Decreased mobility;Pain       PT Treatment Interventions DME instruction;Balance training;Gait training;Neuromuscular re-education;Stair training;Functional mobility training;Patient/family education;Therapeutic activities;Therapeutic exercise;Manual techniques    PT Goals (Current goals can be found in the Care Plan section)  Acute Rehab PT Goals Patient Stated Goal: return home PT Goal Formulation: With patient Time For Goal Achievement: 04/24/22 Potential to Achieve Goals: Good    Frequency       Co-evaluation PT/OT/SLP Co-Evaluation/Treatment: Yes Reason for Co-Treatment: To address functional/ADL transfers PT goals addressed during session: Mobility/safety with mobility;Balance;Strengthening/ROM         AM-PAC PT "6 Clicks" Mobility  Outcome Measure Help needed turning from your back to your side while in a flat bed without using bedrails?: None Help needed moving from lying on your back to sitting on the side of a flat bed without using bedrails?: None Help needed moving to and from a bed to a chair (including a wheelchair)?: None Help needed standing up from a chair using your arms (e.g., wheelchair or bedside chair)?: None Help needed to walk in hospital room?: None Help needed climbing 3-5 steps with a railing? : None 6 Click Score: 24    End of Session   Activity Tolerance: Patient tolerated treatment well Patient left: in chair;with call bell/phone within reach Nurse Communication: Mobility status PT Visit Diagnosis: Other abnormalities of gait and mobility (R26.89);Other symptoms and signs involving the nervous system (R29.898)    Time: 2563-8937 PT Time Calculation (min) (ACUTE ONLY): 10 min   Charges:   PT  Evaluation $PT Eval Low Complexity: 1 Low          9:14 AM, 04/24/22 Wyman Songster PT, DPT Physical Therapist at [facility]

## 2022-05-20 ENCOUNTER — Encounter: Payer: Self-pay | Admitting: Diagnostic Neuroimaging

## 2022-05-20 ENCOUNTER — Ambulatory Visit: Admitting: Diagnostic Neuroimaging

## 2022-05-20 VITALS — BP 116/77 | HR 88 | Ht 60.0 in | Wt 181.4 lb

## 2022-05-20 DIAGNOSIS — G510 Bell's palsy: Secondary | ICD-10-CM

## 2022-05-20 NOTE — Progress Notes (Signed)
[facility]  PATIENT: Melissa [name] DOB: 04/19/1966  REFERRING CLINICIAN: Angelina Sheriff, MD HISTORY FROM: patient  REASON FOR VISIT: new consult   HISTORICAL  CHIEF COMPLAINT:  Chief Complaint  Patient presents with   Facial Droop    Patient in rm 7. Patient alone. Patient states it has been 8 weeks since the ER visit where the code stroke was called.  Dry needling has been tried in [place], but patient says she hasn't seen much change other than watering eye on the left side. Patient states she was only prescribed an 81 mg aspirin daily and has been compliant since. Patient states DR in ER did not feel a statin was necessary due to normal  labs. Patient states having trouble swallowing chewy and super hard foods.     HISTORY OF PRESENT ILLNESS:   56 year old female here for evaluation of left Bell's palsy.  End of September 2023 patient had upper respiratory infection symptoms and cough.  Few days later she developed left upper and lower facial weakness.  She contacted PCP who prescribed prednisone.  Symptoms did not improve.  In November 2023 she had additional slurred speech, left arm numbness, headache, balance difficulty.  She was admitted for stroke workup.  MRI showed chronic right cerebellar infarct.  Also showed some enhancement of the left facial nerve.  Since then patient's are stable.  She denies any prior right cerebellar stroke symptoms except for some vertigo from a few years ago.  In 2021 had right facial weakness, ear pain, was diagnosed with right Bell's palsy versus possible Ramsay Hunt syndrome.  She was treated with prednisone and symptoms resolved within a week.   REVIEW OF SYSTEMS: Full 14 system review of systems performed and negative with exception of: As per HPI.  ALLERGIES: Allergies  Allergen Reactions   Amoxicillin Anaphylaxis and Other (See Comments)    Has patient had a PCN reaction causing immediate rash,  facial/tongue/throat swelling, SOB or lightheadedness with hypotension: Yes Has patient had a PCN reaction causing severe rash involving mucus membranes or skin necrosis: No Has patient had a PCN reaction that required hospitalization No Has patient had a PCN reaction occurring within the last 10 years: No If all of the above answers are "NO", then may proceed with Cephalosporin use.   Ciprofloxacin Anaphylaxis    Other reaction(s): anaphylaxis   Other Anaphylaxis    Pt states that she can only take Avelox, and Zithromax.     Penicillins Anaphylaxis and Other (See Comments)    Has patient had a PCN reaction causing immediate rash, facial/tongue/throat swelling, SOB or lightheadedness with hypotension: Yes Has patient had a PCN reaction causing severe rash involving mucus membranes or skin necrosis: No Has patient had a PCN reaction that required hospitalization No Has patient had a PCN reaction occurring within the last 10 years: No If all of the above answers are "NO", then may proceed with Cephalosporin use.   Sulfa Antibiotics Anaphylaxis and Other (See Comments)   Erythromycin Other (See Comments)    GI upset with oral regimen over 10 years ago Other reaction(s): anaphylaxis   Vicodin [Hydrocodone-Acetaminophen] Itching    HOME MEDICATIONS: Outpatient Medications Prior to Visit  Medication Sig Dispense Refill   albuterol (VENTOLIN HFA) 108 (90 Base) MCG/ACT inhaler Inhale 2 puffs into the lungs every 4 (four) hours as needed for wheezing or shortness of breath. 18 g 0   aspirin 81 MG chewable tablet Chew 81 mg by mouth  daily.     blood glucose meter kit and supplies KIT Dispense based on patient and insurance preference. Use 1x a day. (FOR ICD-9 250.00, 250.01). 1 each 0   gabapentin (NEURONTIN) 300 MG capsule Take 300 mg by mouth 3 (three) times daily.     glucose blood (FREESTYLE LITE) test strip Use as instructed, test blood sugar once a day 100 each 12   insulin glargine, 2 Unit  Dial, (TOUJEO MAX SOLOSTAR) 300 UNIT/ML Solostar Pen Inject 22 Units into the skin at bedtime. For diabetes (Patient taking differently: Inject 16-22 Units into the skin at bedtime. For diabetes, per titration) 9 mL 1   Insulin Pen Needle 32G X 4 MM MISC Use 1x a day 100 each 3   Lancets MISC Use 1x a day 100 each 3   lidocaine (LIDODERM) 5 % Place 1 patch onto the skin daily.     metFORMIN (GLUCOPHAGE-XR) 500 MG 24 hr tablet Take 2 tablets (1,000 mg total) by mouth 2 (two) times daily with a meal. 360 tablet 3   nebivolol (BYSTOLIC) 10 MG tablet Take 1 tablet (10 mg total) by mouth daily. 90 tablet 3   ondansetron (ZOFRAN-ODT) 8 MG disintegrating tablet PLACE 1 TABLET ON THE TONGUE TWICE A DAY AS NEEDED FOR NAUSEA OR VOMITING 20 tablet 0   telmisartan (MICARDIS) 40 MG tablet Take 1 tablet (40 mg total) by mouth daily. For blood pressure. 90 tablet 3   tirzepatide (MOUNJARO) 10 MG/0.5ML Pen Inject 10 mg into the skin once a week. 6 mL 3   venlafaxine XR (EFFEXOR-XR) 37.5 MG 24 hr capsule Take 37.5 mg by mouth daily.     aspirin 325 MG tablet Take 1 tablet (325 mg total) by mouth daily. 30 tablet 0   No facility-administered medications prior to visit.    PAST MEDICAL HISTORY: Past Medical History:  Diagnosis Date   Asthma    Bell's palsy    Complication of anesthesia    Diabetes mellitus without complication (HCC)    Type II   Dysrhythmia    Tachycardia   GERD (gastroesophageal reflux disease)    Pneumonia    2014- 'Walking"   Shortness of breath dyspnea    with exertion   Stroke ([place])    Vertigo     PAST SURGICAL HISTORY: Past Surgical History:  Procedure Laterality Date   CHOLECYSTECTOMY  1997   COLONOSCOPY  2015   CYSTOSCOPY     age 89- stretched stem of bladder   EYE SURGERY     Baby- Strabismus   SHOULDER ARTHROSCOPY Left 11/20/2014   Procedure: ARTHROSCOPY SHOULDER WITH MUA, ROTATOR INTERVAL RELEASE;  Surgeon: Meredith Pel, MD;  Location: [facility];  Service:  Orthopedics;  Laterality: Left;  LEFT SHOULDER MUA, DOA, ROTATOR INTERVAL RELEASE.   TONSILLECTOMY  2010    FAMILY HISTORY: Family History  Adopted: Yes  Problem Relation Age of Onset   Other Other        adopted    SOCIAL HISTORY: Social History   Socioeconomic History   Marital status: Married    Spouse name: Not on file   Number of children: Not on file   Years of education: Not on file   Highest education level: Not on file  Occupational History   Not on file  Tobacco Use   Smoking status: Never   Smokeless tobacco: Never  Vaping Use   Vaping Use: Never used  Substance and Sexual Activity   Alcohol use: Not  Currently   Drug use: No   Sexual activity: Not on file  Other Topics Concern   Not on file  Social History Narrative   Married.   1 daughter.   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    Social Determinants of Health   Financial Resource Strain: Not on file  Food Insecurity: No Food Insecurity (04/24/2022)   Hunger Vital Sign    Worried About Running Out of Food in the Last Year: Never true    Ran Out of Food in the Last Year: Never true  Transportation Needs: No Transportation Needs (04/24/2022)   PRAPARE - Hydrologist (Medical): No    Lack of Transportation (Non-Medical): No  Physical Activity: Not on file  Stress: Not on file  Social Connections: Not on file  Intimate Partner Violence: Not At Risk (04/24/2022)   Humiliation, Afraid, Rape, and Kick questionnaire    Fear of Current or Ex-Partner: No    Emotionally Abused: No    Physically Abused: No    Sexually Abused: No     PHYSICAL EXAM  GENERAL EXAM/CONSTITUTIONAL: Vitals:  Vitals:   05/20/22 1035  BP: 116/77  Pulse: 88  Weight: 181 lb 6.4 oz (82.3 kg)  Height: 5' (1.524 m)   Body mass index is 35.43 kg/m. Wt Readings from Last 3 Encounters:  05/20/22 181 lb 6.4 oz (82.3 kg)  04/23/22 182 lb 1.6 oz (82.6 kg)  04/20/22 180  lb 3.2 oz (81.7 kg)   Patient is in no distress; well developed, nourished and groomed; neck is supple  CARDIOVASCULAR: Examination of carotid arteries is normal; no carotid bruits Regular rate and rhythm, no murmurs Examination of peripheral vascular system by observation and palpation is normal  EYES: Ophthalmoscopic exam of optic discs and posterior segments is normal; no papilledema or hemorrhages No results found.  MUSCULOSKELETAL: Gait, strength, tone, movements noted in Neurologic exam below  NEUROLOGIC: MENTAL STATUS:      No data to display         awake, alert, oriented to person, place and time recent and remote memory intact normal attention and concentration language fluent, comprehension intact, naming intact fund of knowledge appropriate  CRANIAL NERVE:  2nd - no papilledema on fundoscopic exam 2nd, 3rd, 4th, 6th - pupils equal and reactive to light, visual fields full to confrontation, extraocular muscles intact, no nystagmus 5th - facial sensation symmetric 7th - facial strength --> LEFT UPPER AND LOWER WEAKNESS (SEVERE); SLIGHT HYPERACUSIS IN LEFT EAR; SUBJECTIVE TASTE DECREASE IN LEFT TONGUE 8th - hearing intact 9th - palate elevates symmetrically, uvula midline 11th - shoulder shrug symmetric 12th - tongue protrusion midline  MOTOR:  normal bulk and tone, full strength in the BUE, BLE  SENSORY:  normal and symmetric to light touch, temperature, vibration  COORDINATION:  finger-nose-finger, fine finger movements normal  REFLEXES:  deep tendon reflexes TRACE and symmetric  GAIT/STATION:  narrow based gait     DIAGNOSTIC DATA (LABS, IMAGING, TESTING) - I reviewed patient records, labs, notes, testing and imaging myself where available.  Lab Results  Component Value Date   WBC 7.8 04/23/2022   HGB 13.3 04/23/2022   HCT 39.0 04/23/2022   MCV 91.7 04/23/2022   PLT 284 04/23/2022      Component Value Date/Time   NA 138 04/23/2022  1649   K 4.0 04/23/2022 1649   CL 101 04/23/2022 1649   CO2 24 04/23/2022 1636  GLUCOSE 84 04/23/2022 1649   BUN 12 04/23/2022 1649   CREATININE 1.00 04/23/2022 1649   CREATININE 0.94 09/15/2021 1057   CALCIUM 9.1 04/23/2022 1636   PROT 7.8 04/23/2022 1636   ALBUMIN 4.1 04/23/2022 1636   AST 33 04/23/2022 1636   ALT 31 04/23/2022 1636   ALKPHOS 56 04/23/2022 1636   BILITOT 0.6 04/23/2022 1636   GFRNONAA >60 04/23/2022 1636   GFRAA >60 12/26/2019 0206   Lab Results  Component Value Date   CHOL 130 04/24/2022   HDL 44 04/24/2022   LDLCALC 68 04/24/2022   LDLDIRECT 125.0 06/08/2019   TRIG 91 04/24/2022   CHOLHDL 3.0 04/24/2022   Lab Results  Component Value Date   HGBA1C 5.0 04/23/2022   Lab Results  Component Value Date   VITAMINB12 1,082 (H) 03/23/2016   Lab Results  Component Value Date   TSH 2.67 03/23/2016    04/23/22 CTA head 1. Negative for intracranial large vessel occlusion. 2. 25% diameter stenosis left carotid bulb due to atherosclerotic disease. No significant right carotid or vertebral artery stenosis in the neck.  04/23/22 MRI brain [I reviewed images myself and agree with interpretation. Right cerebellar finding could be infarct vs prominent sulcus. -VRP]  1. Negative for acute infarct. 2. Asymmetric contrast enhancement at the genu and the tympanic segment of the left facial nerve could represent findings related to Bell's palsy. 3. Chronic right cerebellar infarct. 4. 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 Of note the right vertebral artery was likely not contrast opacified on prior CT dissection study dated 12/26/2019.    ASSESSMENT AND PLAN  56 y.o. year old female here with:   Dx:  1. 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    VPenni Bombard MD 149/01/2640 158:30AM Certified in Neurology, Neurophysiology and Neuroimaging  [facility] [address] [place] [phone]

## 2022-08-31 ENCOUNTER — Encounter: Payer: Self-pay | Admitting: Internal Medicine

## 2022-08-31 ENCOUNTER — Ambulatory Visit (INDEPENDENT_AMBULATORY_CARE_PROVIDER_SITE_OTHER): Admitting: Internal Medicine

## 2022-08-31 ENCOUNTER — Other Ambulatory Visit: Payer: Self-pay | Admitting: Internal Medicine

## 2022-08-31 VITALS — BP 120/82 | HR 91 | Ht 60.0 in | Wt 176.4 lb

## 2022-08-31 DIAGNOSIS — E785 Hyperlipidemia, unspecified: Secondary | ICD-10-CM

## 2022-08-31 DIAGNOSIS — E1165 Type 2 diabetes mellitus with hyperglycemia: Secondary | ICD-10-CM

## 2022-08-31 DIAGNOSIS — Z6841 Body Mass Index (BMI) 40.0 and over, adult: Secondary | ICD-10-CM

## 2022-08-31 NOTE — Progress Notes (Signed)
Patient ID: Melissa [name], female   DOB: Jul 30, 1965, 57 y.o.   MRN: [id number]   HPI: MARAE [name] is a 57 y.o.-year-old female, returning for f/u for DM2, dx in 1997, insulin-dependent, uncontrolled, without long-term complications. Last OV 4 months ago.  Interim history: No increased urination, blurry vision, chest pain.   Her nausea resolved after switching to metformin ER. She walks a lot both at work and at home.   She lost 30 more pounds before last visit, and 6 more since then. She had a steroid inj in R shoulder. She will have abdominal skin removal surgery next mo.  Reviewed HbA1c levels: 08/24/2022: HbA1c 5.3% Lab Results  Component Value Date   HGBA1C 5.0 04/23/2022   HGBA1C 5.0 04/20/2022   HGBA1C 6.0 (A) 09/15/2021  1997: HbA1c 11%  She is on: - Metformin >> Metformin ER  1000 mg 2x a day with meals  - Trulicity 1.5 >> 3 >> 4.5 mg weekly >> Mounjaro 5 >> 10 mg weekly - Toujeo  28 >> 34 >> 24 >> 16 >> 32 >> 22 >> but using 28-34 units daily - Previously on JanuMet.  Pt checks sugars 4x a day - with the Dexcom G7:  Previously:  Previously:   Lowest sugar was 81 >> 53 (but with glucometer: 80s)  >> 61 >> 75; she has hypoglycemia awareness in the 70s. Highest sugar was 180 >> 380 (steroids) >> 200 >> 190.  Glucometer: RelioN >> Freestyle lite  -No CKD; last BUN/creatinine:  08/24/2022: 11/0.9, GFR>60 Lab Results  Component Value Date   BUN 12 04/23/2022   BUN 14 04/23/2022   CREATININE 1.00 04/23/2022   CREATININE 0.95 04/23/2022  On telmisartan 40.  -+ HL; last set of lipids: 08/24/2022: 151/136/46/78 Lab Results  Component Value Date   CHOL 130 04/24/2022   HDL 44 04/24/2022   LDLCALC 68 04/24/2022   LDLDIRECT 125.0 06/08/2019   TRIG 91 04/24/2022   CHOLHDL 3.0 04/24/2022  She could not tolerate statins and Zetia in the past.  She has fatty liver and a history of transaminitis, But latest LFTs were normal in 08/24/2022. Lab Results  Component Value  Date   ALT 31 04/23/2022   AST 33 04/23/2022   ALKPHOS 56 04/23/2022   BILITOT 0.6 04/23/2022   - last eye exam was in 04/2022: No DR.  - no numbness and tingling in her feet.  Last foot exam 04/20/2022.  Pt is adopted - ? FH of DM.  08/24/2022: TSH 1.277  She had COVID-19 in 06/2020.  She had prednisone at that time and sugars increased to the 300s. She lost 55 lbs in 2019 ([employer]), then gained back 40 pounds. She mentions an allergy to almost all antibiotics, but she can take Levaquin, Z-Pack, Avelox, doxycycline. She was in the emergency room with rectal bleeding 01/29/2021.  She had a CT scan of the abdomen which showed diverticulosis, but without diverticulitis.   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  ROS: + See HPI  I reviewed pt's medications, allergies, PMH, social hx, family hx, and changes were documented in the history of present illness. Otherwise, unchanged from my initial visit note.  Past Medical History:  Diagnosis Date   Asthma    Bell's palsy    Complication of anesthesia    Diabetes mellitus without complication (HCC)    Type II   Dysrhythmia    Tachycardia   GERD (gastroesophageal reflux disease)    Pneumonia  2014- '[facility]alking"   Shortness of breath dyspnea    with exertion   Stroke [facility])    Vertigo    Past Surgical History:  Procedure Laterality Date   CHOLECYSTECTOMY  1997   COLONOSCOPY  2015   CYSTOSCOPY     age 44- stretched stem of bladder   EYE SURGERY     Baby- Strabismus   SHOULDER ARTHROSCOPY Left 11/20/2014   Procedure: ARTHROSCOPY SHOULDER WITH MUA, ROTATOR INTERVAL RELEASE;  Surgeon: Meredith Pel, MD;  Location: [place];  Service: Orthopedics;  Laterality: Left;  LEFT SHOULDER MUA, DOA, ROTATOR INTERVAL RELEASE.   TONSILLECTOMY  2010   Social History   Socioeconomic History   Marital status: Married    Spouse name: Not on file   Number of children: 1   Years of education: Not on file   Highest education level: Not on  file  Occupational History     Tobacco Use   Smoking status: Never Smoker   Smokeless tobacco: Never Used  Substance and Sexual Activity   Alcohol use: Yes    Alcohol/week: 0.0 oz    Comment: rare   Drug use: No      Social History Narrative   Married.   1 daughter.   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    Current Outpatient Medications on File Prior to Visit  Medication Sig Dispense Refill   albuterol (VENTOLIN HFA) 108 (90 Base) MCG/ACT inhaler Inhale 2 puffs into the lungs every 4 (four) hours as needed for wheezing or shortness of breath. 18 g 0   aspirin 81 MG chewable tablet Chew 81 mg by mouth daily.     blood glucose meter kit and supplies KIT Dispense based on patient and insurance preference. Use 1x a day. (FOR ICD-9 250.00, 250.01). 1 each 0   gabapentin (NEURONTIN) 300 MG capsule Take 300 mg by mouth 3 (three) times daily.     glucose blood (FREESTYLE LITE) test strip Use as instructed, test blood sugar once a day 100 each 12   Insulin Pen Needle 32G X 4 MM MISC Use 1x a day 100 each 3   Lancets MISC Use 1x a day 100 each 3   lidocaine (LIDODERM) 5 % Place 1 patch onto the skin daily.     metFORMIN (GLUCOPHAGE-XR) 500 MG 24 hr tablet Take 2 tablets (1,000 mg total) by mouth 2 (two) times daily with a meal. 360 tablet 3   nebivolol (BYSTOLIC) 10 MG tablet Take 1 tablet (10 mg total) by mouth daily. 90 tablet 3   ondansetron (ZOFRAN-ODT) 8 MG disintegrating tablet PLACE 1 TABLET ON THE TONGUE TWICE A DAY AS NEEDED FOR NAUSEA OR VOMITING 20 tablet 0   telmisartan (MICARDIS) 40 MG tablet Take 1 tablet (40 mg total) by mouth daily. For blood pressure. 90 tablet 3   tirzepatide (MOUNJARO) 10 MG/0.5ML Pen Inject 10 mg into the skin once a week. 6 mL 3   venlafaxine XR (EFFEXOR-XR) 37.5 MG 24 hr capsule Take 37.5 mg by mouth daily.     No current facility-administered medications on file prior to visit.   Allergies  Allergen Reactions    Amoxicillin Anaphylaxis and Other (See Comments)    Has patient had a PCN reaction causing immediate rash, facial/tongue/throat swelling, SOB or lightheadedness with hypotension: Yes Has patient had a PCN reaction causing severe rash involving mucus membranes or skin necrosis: No Has patient had a PCN reaction that  required hospitalization No Has patient had a PCN reaction occurring within the last 10 years: No If all of the above answers are "NO", then may proceed with Cephalosporin use.   Ciprofloxacin Anaphylaxis    Other reaction(s): anaphylaxis   Other Anaphylaxis    Pt states that she can only take Avelox, and Zithromax.     Penicillins Anaphylaxis and Other (See Comments)    Has patient had a PCN reaction causing immediate rash, facial/tongue/throat swelling, SOB or lightheadedness with hypotension: Yes Has patient had a PCN reaction causing severe rash involving mucus membranes or skin necrosis: No Has patient had a PCN reaction that required hospitalization No Has patient had a PCN reaction occurring within the last 10 years: No If all of the above answers are "NO", then may proceed with Cephalosporin use.   Sulfa Antibiotics Anaphylaxis and Other (See Comments)   Erythromycin Other (See Comments)    GI upset with oral regimen over 10 years ago Other reaction(s): anaphylaxis   Vicodin [Hydrocodone-Acetaminophen] Itching   Family History  Adopted: Yes  Problem Relation Age of Onset   Other Other        adopted    PE: BP 120/82 (BP Location: Left Arm, Patient Position: Sitting, Cuff Size: Normal)   Pulse 91   Ht 5' (1.524 m)   Wt 176 lb 6.4 oz (80 kg)   SpO2 98%   BMI 34.45 kg/m  Wt Readings from Last 3 Encounters:  08/31/22 176 lb 6.4 oz (80 kg)  05/20/22 181 lb 6.4 oz (82.3 kg)  04/23/22 182 lb 1.6 oz (82.6 kg)   Constitutional: overweight, in NAD Eyes:  EOMI, no exophthalmos ENT: no neck masses, no cervical lymphadenopathy Cardiovascular: RRR, No  MRG Respiratory: CTA B Musculoskeletal: no deformities Skin:no rashes Neurological: no tremor with outstretched hands  ASSESSMENT: 1. DM2, insulin-dependent, uncontrolled, without long-term complications, but with hyperglycemia  Investigation for type 1 diabetes was negative: Component     Latest Ref Rng & Units 08/01/2018  C-Peptide     0.80 - 3.85 ng/mL 2.53  Islet Cell Ab     Neg:<1:1 Negative  Glucose, Plasma     65 - 99 mg/dL 100 (H)  ZNT8 Antibodies     U/mL <15  Glutamic Acid Decarb Ab     <5 IU/mL <5   2.  Hyperlipidemia  3.  Obesity class III  PLAN:  1. Patient with longstanding, uncontrolled, type 2 diabetes, on oral antidiabetic regimen with metformin and also injectable regimen with long-acting insulin and weekly GLP-1/GIP receptor agonist.  At last visit, sugars were excellent at all times of the day with occasional mild lows so we went ahead and increase her Toujeo dose and increase the Mounjaro dose.  She tolerates Mounjaro well.  HbA1c at that time was 5.0%, decreased. CGM interpretation: -At today's visit, we reviewed her CGM downloads: It appears that 100% of values are in target range (goal >70%).  The calculated average blood sugar is 116.  The projected HbA1c for the next 3 months (GMI) is 6.1%. -Reviewing the CGM trends, the sugars appear to be all fluctuating within the target range.  She had sinusitis earlier in the year and sugars are slightly higher.  Also, they were slightly higher after getting a steroid injection in her shoulder 3 weeks ago.  However, they improved immediately afterwards. -Upon questioning, she is using a higher dose of Toujeo than recommended at last visit and we discussed about possibly trying to decrease the dose,  at her own pace.  We can continue the rest of the regimen. - I suggested to:  Patient Instructions  Please continue: - Metformin ER 1000 mg 2x a day with meals - Toujeo 28-32 units daily at bedtime (try to decrease the  dose if feasible) - Mounjaro 10 mg weekly  Please come back for a follow-up appointment in 4-6 months.  - advised to check sugars at different times of the day - 4x a day, rotating check times - advised for yearly eye exams >> she is UTD - return to clinic in 4 months  2.  Hyperlipidemia -Reviewed latest lipid panel from 08/2022: Fractions at goal: -She is on a statin.  We tried Zetia but she developed muscle aches. -I recommended the portfolio diet at previous visits  3.  Obesity class III -Continues Mounjaro which should also help with weight loss.  We increased the dose at last visit.  Tolerates it well. -Before last visit, she lost another 30 pounds, previously lost 32 pounds before the prior 2 visits -she lost 6 lbs since last OV -She will have abdominal skin removal surgery next month.  Philemon Kingdom, MD PhD [facility] Endocrinology

## 2022-08-31 NOTE — Patient Instructions (Addendum)
Please continue: - Metformin ER 1000 mg 2x a day with meals - Toujeo 28-32 units daily at bedtime (try to decrease the dose if feasible) - Mounjaro 10 mg weekly  Please come back for a follow-up appointment in 4-6 months.

## 2022-09-03 ENCOUNTER — Ambulatory Visit: Admitting: Surgical

## 2022-09-09 ENCOUNTER — Other Ambulatory Visit (HOSPITAL_COMMUNITY): Payer: Self-pay

## 2023-01-04 ENCOUNTER — Encounter: Payer: Self-pay | Admitting: Internal Medicine

## 2023-01-04 ENCOUNTER — Ambulatory Visit: Admitting: Internal Medicine

## 2023-01-04 VITALS — BP 120/80 | HR 88 | Ht 60.0 in | Wt 161.6 lb

## 2023-01-04 DIAGNOSIS — Z7985 Long-term (current) use of injectable non-insulin antidiabetic drugs: Secondary | ICD-10-CM

## 2023-01-04 DIAGNOSIS — E119 Type 2 diabetes mellitus without complications: Secondary | ICD-10-CM

## 2023-01-04 DIAGNOSIS — E1165 Type 2 diabetes mellitus with hyperglycemia: Secondary | ICD-10-CM

## 2023-01-04 DIAGNOSIS — Z794 Long term (current) use of insulin: Secondary | ICD-10-CM | POA: Diagnosis not present

## 2023-01-04 DIAGNOSIS — E785 Hyperlipidemia, unspecified: Secondary | ICD-10-CM

## 2023-01-04 DIAGNOSIS — E669 Obesity, unspecified: Secondary | ICD-10-CM | POA: Diagnosis not present

## 2023-01-04 DIAGNOSIS — Z7984 Long term (current) use of oral hypoglycemic drugs: Secondary | ICD-10-CM

## 2023-01-04 DIAGNOSIS — E66811 Obesity, class 1: Secondary | ICD-10-CM

## 2023-01-04 LAB — POCT GLYCOSYLATED HEMOGLOBIN (HGB A1C): Hemoglobin A1C: 5.1 % (ref 4.0–5.6)

## 2023-01-04 NOTE — Progress Notes (Signed)
Patient ID: Melissa [name], female   DOB: 12/06/1965, 57 y.o.   MRN: [id number]   HPI: Melissa [name] is a 57 y.o.-year-old female, returning for f/u for DM2, dx in 1997, insulin-dependent, controlled, without long-term complications. Last OV 4 months ago.  Interim history: No increased urination, blurry vision, chest pain.   Her nausea resolved after switching to metformin ER. She had abdominal skin removal 09/2022. She lost another 15 pounds since last visit. She has R shoulder pain -  Rotator cuff tear and ligament strain. She has a metallic taste and is not eating large meals because of this.  Reviewed HbA1c levels: 08/24/2022: HbA1c 5.3% Lab Results  Component Value Date   HGBA1C 5.0 04/23/2022   HGBA1C 5.0 04/20/2022   HGBA1C 6.0 (A) 09/15/2021  1997: HbA1c 11%  She is on: - Metformin >> Metformin ER  1000 mg 2x a day with meals  - Trulicity 1.5 >> 3 >> 4.5 mg weekly >> Mounjaro 5 >> 10 mg weekly - Toujeo  28 >> 34 >> 24 >> 16 >> 32 >> 22 >> but using 28-34 >> 16-20 units daily - Previously on JanuMet.  Pt was checking sugars 4x a day with the Dexcom G7, but came off around the time of her abdominal surgery in 09/2022.  She did not check sugars since then.  Previously:  Previously:   Lowest sugar was 53 (but with glucometer: 80s)  >> 61 >> 75; she has hypoglycemia awareness in the 70s. Highest sugar was 380 (steroids) >> 200 >> 190.  Glucometer: RelioN >> Freestyle lite  -No CKD; last BUN/creatinine:  08/24/2022: 11/0.9, GFR>60 Lab Results  Component Value Date   BUN 12 04/23/2022   BUN 14 04/23/2022   CREATININE 1.00 04/23/2022   CREATININE 0.95 04/23/2022  On telmisartan 40.  -+ HL; last set of lipids: 08/24/2022: 151/136/46/78 Lab Results  Component Value Date   CHOL 130 04/24/2022   HDL 44 04/24/2022   LDLCALC 68 04/24/2022   LDLDIRECT 125.0 06/08/2019   TRIG 91 04/24/2022   CHOLHDL 3.0 04/24/2022  She could not tolerate statins and Zetia in the  past.  She has fatty liver and a history of transaminitis, But latest LFTs were normal: Lab Results  Component Value Date   ALT 31 04/23/2022   AST 33 04/23/2022   ALKPHOS 56 04/23/2022   BILITOT 0.6 04/23/2022   - last eye exam was in 04/2022: No DR.  - no numbness and tingling in her feet.  Last foot exam 04/20/2022.  Pt is adopted - ? FH of DM.  08/24/2022: TSH 1.277  She had COVID-19 in 06/2020.  She had prednisone at that time and sugars increased to the 300s. She lost 55 lbs in 2019 ([employer]), then gained back 40 pounds. She mentions an allergy to almost all antibiotics, but she can take Levaquin, Z-Pack, Avelox, doxycycline. She was in the emergency room with rectal bleeding 01/29/2021.  She had a CT scan of the abdomen which showed diverticulosis, but without diverticulitis.   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  ROS: + See HPI  I reviewed pt's medications, allergies, PMH, social hx, family hx, and changes were documented in the history of present illness. Otherwise, unchanged from my initial visit note.  Past Medical History:  Diagnosis Date   Asthma    Bell's palsy    Complication of anesthesia    Diabetes mellitus without complication (HCC)    Type II   Dysrhythmia  Tachycardia   GERD (gastroesophageal reflux disease)    Pneumonia    2014- '[facility]alking"   Shortness of breath dyspnea    with exertion   Stroke [facility])    Vertigo    Past Surgical History:  Procedure Laterality Date   CHOLECYSTECTOMY  1997   COLONOSCOPY  2015   CYSTOSCOPY     age 30- stretched stem of bladder   EYE SURGERY     Baby- Strabismus   SHOULDER ARTHROSCOPY Left 11/20/2014   Procedure: ARTHROSCOPY SHOULDER WITH MUA, ROTATOR INTERVAL RELEASE;  Surgeon: Cammy Copa, MD;  Location: [facility] OR;  Service: Orthopedics;  Laterality: Left;  LEFT SHOULDER MUA, DOA, ROTATOR INTERVAL RELEASE.   TONSILLECTOMY  2010   Social History   Socioeconomic History   Marital status: Married     Spouse name: Not on file   Number of children: 1   Years of education: Not on file   Highest education level: Not on file  Occupational History     Tobacco Use   Smoking status: Never Smoker   Smokeless tobacco: Never Used  Substance and Sexual Activity   Alcohol use: Yes    Alcohol/week: 0.0 oz    Comment: rare   Drug use: No      Social History Narrative   Married.   1 daughter.   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    Current Outpatient Medications on File Prior to Visit  Medication Sig Dispense Refill   albuterol (VENTOLIN HFA) 108 (90 Base) MCG/ACT inhaler Inhale 2 puffs into the lungs every 4 (four) hours as needed for wheezing or shortness of breath. 18 g 0   aspirin 81 MG chewable tablet Chew 81 mg by mouth daily.     blood glucose meter kit and supplies KIT Dispense based on patient and insurance preference. Use 1x a day. (FOR ICD-9 250.00, 250.01). 1 each 0   gabapentin (NEURONTIN) 300 MG capsule Take 300 mg by mouth 3 (three) times daily.     glucose blood (FREESTYLE LITE) test strip Use as instructed, test blood sugar once a day 100 each 12   insulin glargine, 2 Unit Dial, (TOUJEO MAX SOLOSTAR) 300 UNIT/ML Solostar Pen Inject 22 Units into the skin at bedtime. For diabetes, per titration 6 mL 2   Insulin Pen Needle 32G X 4 MM MISC Use 1x a day 100 each 3   Lancets MISC Use 1x a day 100 each 3   lidocaine (LIDODERM) 5 % Place 1 patch onto the skin daily.     metFORMIN (GLUCOPHAGE-XR) 500 MG 24 hr tablet Take 2 tablets (1,000 mg total) by mouth 2 (two) times daily with a meal. 360 tablet 3   nebivolol (BYSTOLIC) 10 MG tablet Take 1 tablet (10 mg total) by mouth daily. 90 tablet 3   ondansetron (ZOFRAN-ODT) 8 MG disintegrating tablet PLACE 1 TABLET ON THE TONGUE TWICE A DAY AS NEEDED FOR NAUSEA OR VOMITING 20 tablet 0   telmisartan (MICARDIS) 40 MG tablet Take 1 tablet (40 mg total) by mouth daily. For blood pressure. 90 tablet 3    tirzepatide (MOUNJARO) 10 MG/0.5ML Pen Inject 10 mg into the skin once a week. 6 mL 3   venlafaxine XR (EFFEXOR-XR) 37.5 MG 24 hr capsule Take 37.5 mg by mouth daily.     No current facility-administered medications on file prior to visit.   Allergies  Allergen Reactions   Amoxicillin Anaphylaxis and Other (See Comments)  Has patient had a PCN reaction causing immediate rash, facial/tongue/throat swelling, SOB or lightheadedness with hypotension: Yes Has patient had a PCN reaction causing severe rash involving mucus membranes or skin necrosis: No Has patient had a PCN reaction that required hospitalization No Has patient had a PCN reaction occurring within the last 10 years: No If all of the above answers are "NO", then [name] proceed with Cephalosporin use.   Ciprofloxacin Anaphylaxis    Other reaction(s): anaphylaxis   Other Anaphylaxis    Pt states that she can only take Avelox, and Zithromax.     Penicillins Anaphylaxis and Other (See Comments)    Has patient had a PCN reaction causing immediate rash, facial/tongue/throat swelling, SOB or lightheadedness with hypotension: Yes Has patient had a PCN reaction causing severe rash involving mucus membranes or skin necrosis: No Has patient had a PCN reaction that required hospitalization No Has patient had a PCN reaction occurring within the last 10 years: No If all of the above answers are "NO", then [name] proceed with Cephalosporin use.   Sulfa Antibiotics Anaphylaxis and Other (See Comments)   Erythromycin Other (See Comments)    GI upset with oral regimen over 10 years ago Other reaction(s): anaphylaxis   Vicodin [Hydrocodone-Acetaminophen] Itching   Family History  Adopted: Yes  Problem Relation Age of Onset   Other Other        adopted   PE: BP 120/80   Pulse 88   Ht 5' (1.524 m)   Wt 161 lb 9.6 oz (73.3 kg)   SpO2 99%   BMI 31.56 kg/m  Wt Readings from Last 3 Encounters:  01/04/23 161 lb 9.6 oz (73.3 kg)  08/31/22 176  lb 6.4 oz (80 kg)  05/20/22 181 lb 6.4 oz (82.3 kg)   Constitutional: slightly overweight, in NAD Eyes:  EOMI, no exophthalmos ENT: no neck masses, no cervical lymphadenopathy Cardiovascular: RRR, No MRG Respiratory: CTA B Musculoskeletal: no deformities Skin:no rashes Neurological: no tremor with outstretched hands  ASSESSMENT: 1. DM2, insulin-dependent, controlled, without long-term complications, but with hyperglycemia  Investigation for type 1 diabetes was negative: Component     Latest Ref Rng & Units 08/01/2018  C-Peptide     0.80 - 3.85 ng/mL 2.53  Islet Cell Ab     Neg:<1:1 Negative  Glucose, Plasma     65 - 99 mg/dL 161 (H)  ZNT8 Antibodies     U/mL <15  Glutamic Acid Decarb Ab     <5 IU/mL <5   2.  Hyperlipidemia  3.  Obesity class III  PLAN:  1. Patient with longstanding, controlled, type 2 diabetes, on oral antidiabetic regimen with metformin ER, also long-acting insulin and weekly GLP-1/GIP receptor agonist.  At last visit, sugars are fluctuating goal within the target range.  They were slightly higher after getting his prior steroid injection in her shoulder 3 weeks prior to the appointment but they improved immediately afterwards.  We discussed about reducing the dose of Toujeo but continues the rest of the regimen.  HbA1c was excellent, prior to the visit, 5.3%. -At today's visit, she does not have the sensor on and she is also not checking blood sugars.  We discussed about restarting the sensor not only for blood sugar control but also behavioral modification.  We would also use this for alarms, in case she has low blood sugars.  We discussed about continuing to reduce the dose of Toujeo as needed depending on the blood sugars and I am hoping that  she can come off insulin before next visit.  However, I did not recommend a change as of now, until she starts checking blood sugars. - I suggested to:  Patient Instructions  Please continue: - Metformin ER 1000 mg 2x  a day with meals - Toujeo 16-20  units daily at bedtime  - Mounjaro 10 mg weekly  Please come back for a follow-up appointment in 4-6 months.  - we checked her HbA1c: 5.1% (excellent) - advised to check sugars at different times of the day - 4x a day, rotating check times - advised for yearly eye exams >> she is UTD - return to clinic in 4-6 months  2.  Hyperlipidemia -Reviewed latest Zetia panel from 08/2022: Fractions at goal -She is not on a statin.  We tried Zetia but she developed muscle aches -I recommended the portfolio diet at previous visits.  3.  Obesity class III -She continues Mounjaro which should also help with weight loss -At last visit, she had lost almost 70 pounds after starting it, of which 6 pounds before last visit.  At that time, she was preparing for skin removal surgery.  She had this 3 months ago.  She healed well. -Since last visit, she lost another 15 pounds.  Carlus Pavlov, MD PhD [facility] Endocrinology

## 2023-01-04 NOTE — Patient Instructions (Signed)
Please continue: - Metformin ER 1000 mg 2x a day with meals - Toujeo 16-20  units daily at bedtime  - Mounjaro 10 mg weekly  Please come back for a follow-up appointment in 4-6 months.

## 2023-01-26 ENCOUNTER — Encounter: Payer: Self-pay | Admitting: Internal Medicine

## 2023-01-27 ENCOUNTER — Telehealth: Payer: Self-pay

## 2023-01-27 MED ORDER — TIRZEPATIDE 5 MG/0.5ML ~~LOC~~ SOAJ: 10.0000 mg | SUBCUTANEOUS | 3 refills | Status: DC

## 2023-01-27 NOTE — Telephone Encounter (Signed)
Patient called stating that her pharmacy does not have 10 mg of Mounjaro in stock but they advised her that if we send in the 5 mg she could use double.   Rx has been sent.   Requested Prescriptions   Signed Prescriptions Disp Refills   tirzepatide (MOUNJARO) 5 MG/0.5ML Pen 12 mL 3    Sig: Inject 10 mg into the skin once a week.    Authorizing Provider: Carlus Pavlov    Ordering User: Pollie Meyer

## 2023-03-03 NOTE — Addendum Note (Signed)
Addended by: Pollie Meyer on: 03/03/2023 04:03 PM   Modules accepted: Orders

## 2023-04-22 ENCOUNTER — Other Ambulatory Visit: Payer: Self-pay | Admitting: Internal Medicine

## 2023-05-24 ENCOUNTER — Encounter: Payer: Self-pay | Admitting: Internal Medicine

## 2023-05-24 ENCOUNTER — Ambulatory Visit (INDEPENDENT_AMBULATORY_CARE_PROVIDER_SITE_OTHER): Payer: TRICARE For Life (TFL) | Admitting: Internal Medicine

## 2023-05-24 VITALS — BP 90/72 | HR 88 | Ht 60.0 in | Wt 136.0 lb

## 2023-05-24 DIAGNOSIS — Z7984 Long term (current) use of oral hypoglycemic drugs: Secondary | ICD-10-CM | POA: Diagnosis not present

## 2023-05-24 DIAGNOSIS — E785 Hyperlipidemia, unspecified: Secondary | ICD-10-CM

## 2023-05-24 DIAGNOSIS — E66811 Obesity, class 1: Secondary | ICD-10-CM

## 2023-05-24 DIAGNOSIS — E119 Type 2 diabetes mellitus without complications: Secondary | ICD-10-CM | POA: Diagnosis not present

## 2023-05-24 LAB — POCT GLYCOSYLATED HEMOGLOBIN (HGB A1C): Hemoglobin A1C: 5.2 % (ref 4.0–5.6)

## 2023-05-24 MED ORDER — TIRZEPATIDE 5 MG/0.5ML ~~LOC~~ SOAJ: 10.0000 mg | SUBCUTANEOUS | 3 refills | Status: DC

## 2023-05-24 MED ORDER — METFORMIN HCL ER 500 MG PO TB24: 1000.0000 mg | ORAL_TABLET | Freq: Two times a day (BID) | ORAL | 3 refills | Status: DC

## 2023-05-24 NOTE — Patient Instructions (Addendum)
Please continue: - Metformin ER 1000 mg 2x a day with meals  Please decrease: - Mounjaro 5 mg weekly  Please come back for a follow-up appointment in 4 months.

## 2023-05-24 NOTE — Progress Notes (Unsigned)
Patient ID: Melissa [name], female   DOB: 01/26/1966, 57 y.o.   MRN: [id number]   HPI: Melissa [name] is a 57 y.o.-year-old female, returning for f/u for DM2, dx in 1997, insulin-dependent until recently, controlled, without long-term complications. Last OV 5 months ago.  Interim history: No increased urination, blurry vision, chest pain.   Her nausea resolved after switching to metformin ER. She has significant weight loss but mentions that this is partly due to dysgeusia not decreased appetite necessarily - everything tastes bitter even since her Bell's palsy 02/2022.  Reviewed HbA1c levels: Lab Results  Component Value Date   HGBA1C 5.1 01/04/2023   HGBA1C 5.0 04/23/2022   HGBA1C 5.0 04/20/2022  08/24/2022: HbA1c 5.3% 1997: HbA1c 11%  She is on: - Metformin >> Metformin ER  1000 mg 2x a day with meals  - Trulicity 1.5 >> 3 >> 4.5 mg weekly >> Mounjaro 5 >> 10 mg weekly - >> off - Previously on JanuMet.  Pt was checking sugars 4x a day with the Dexcom G7 - not now - she is not checking manually either...  Previously:  Previously:   Lowest sugar was 53 (but with glucometer: 80s)  >> 61 >> 75 >> ?; she has hypoglycemia awareness in the 70s. Highest sugar was 380 (steroids) >> 200 >> 190 >> ?  Glucometer: RelioN >> Freestyle lite  -No CKD; last BUN/creatinine:  Lab Results  Component Value Date   BUN 12 04/23/2022   BUN 14 04/23/2022   CREATININE 1.00 04/23/2022   CREATININE 0.95 04/23/2022   Lab Results  Component Value Date   MICRALBCREAT 1.3 09/15/2021  On telmisartan 40.  -+ HL; last set of lipids: 08/24/2022: 151/136/46/78 Lab Results  Component Value Date   CHOL 130 04/24/2022   HDL 44 04/24/2022   LDLCALC 68 04/24/2022   LDLDIRECT 125.0 06/08/2019   TRIG 91 04/24/2022   CHOLHDL 3.0 04/24/2022  She could not tolerate statins and Zetia in the past.  She has fatty liver and a history of transaminitis, But latest LFTs were normal: Lab Results  Component Value  Date   ALT 31 04/23/2022   AST 33 04/23/2022   ALKPHOS 56 04/23/2022   BILITOT 0.6 04/23/2022   - last eye exam was in 04/2022: No DR.  - no numbness and tingling in her feet.  Last foot exam 04/20/2022.  Pt is adopted - ? FH of DM.  08/24/2022: TSH 1.277  She had COVID-19 in 06/2020.  She had prednisone at that time and sugars increased to the 300s. She lost 55 lbs in 2019 ([facility]), then gained back 40 pounds. She mentions an allergy to almost all antibiotics, but she can take Levaquin, Z-Pack, Avelox, doxycycline. She was in the emergency room with rectal bleeding 01/29/2021.  She had a CT scan of the abdomen which showed diverticulosis, but without diverticulitis.  She had abdominal skin removal 09/2022.  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  Her father died 02-25-2023.  He had multiple health problems.  ROS: + See HPI  I reviewed pt's medications, allergies, PMH, social hx, family hx, and changes were documented in the history of present illness. Otherwise, unchanged from my initial visit note.  Past Medical History:  Diagnosis Date   Asthma    Bell's palsy    Complication of anesthesia    Diabetes mellitus without complication (HCC)    Type II   Dysrhythmia    Tachycardia   GERD (gastroesophageal reflux disease)  Pneumonia    2014- '[facility]alking"   Shortness of breath dyspnea    with exertion   Stroke [facility])    Vertigo    Past Surgical History:  Procedure Laterality Date   CHOLECYSTECTOMY  1997   COLONOSCOPY  2015   CYSTOSCOPY     age 89- stretched stem of bladder   EYE SURGERY     Baby- Strabismus   SHOULDER ARTHROSCOPY Left 11/20/2014   Procedure: ARTHROSCOPY SHOULDER WITH MUA, ROTATOR INTERVAL RELEASE;  Surgeon: Cammy Copa, MD;  Location: [facility] OR;  Service: Orthopedics;  Laterality: Left;  LEFT SHOULDER MUA, DOA, ROTATOR INTERVAL RELEASE.   TONSILLECTOMY  2010   Social History   Socioeconomic History   Marital status: Married    Spouse name: Not on  file   Number of children: 1   Years of education: Not on file   Highest education level: Not on file  Occupational History     Tobacco Use   Smoking status: Never Smoker   Smokeless tobacco: Never Used  Substance and Sexual Activity   Alcohol use: Yes    Alcohol/week: 0.0 oz    Comment: rare   Drug use: No      Social History Narrative   Married.   1 daughter.   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    Current Outpatient Medications on File Prior to Visit  Medication Sig Dispense Refill   albuterol (VENTOLIN HFA) 108 (90 Base) MCG/ACT inhaler Inhale 2 puffs into the lungs every 4 (four) hours as needed for wheezing or shortness of breath. 18 g 0   aspirin 81 MG chewable tablet Chew 81 mg by mouth daily.     blood glucose meter kit and supplies KIT Dispense based on patient and insurance preference. Use 1x a day. (FOR ICD-9 250.00, 250.01). 1 each 0   gabapentin (NEURONTIN) 300 MG capsule Take 300 mg by mouth 3 (three) times daily.     glucose blood (FREESTYLE LITE) test strip Use as instructed, test blood sugar once a day 100 each 12   insulin glargine, 2 Unit Dial, (TOUJEO MAX SOLOSTAR) 300 UNIT/ML Solostar Pen Inject 22 Units into the skin at bedtime. For diabetes, per titration 6 mL 2   Insulin Pen Needle 32G X 4 MM MISC Use 1x a day 100 each 3   Lancets MISC Use 1x a day 100 each 3   lidocaine (LIDODERM) 5 % Place 1 patch onto the skin daily.     metFORMIN (GLUCOPHAGE-XR) 500 MG 24 hr tablet Take 2 tablets (1,000 mg total) by mouth 2 (two) times daily with a meal. 360 tablet 3   nebivolol (BYSTOLIC) 10 MG tablet Take 1 tablet (10 mg total) by mouth daily. 90 tablet 3   ondansetron (ZOFRAN-ODT) 8 MG disintegrating tablet PLACE 1 TABLET ON THE TONGUE TWICE A DAY AS NEEDED FOR NAUSEA OR VOMITING 20 tablet 0   telmisartan (MICARDIS) 40 MG tablet Take 1 tablet (40 mg total) by mouth daily. For blood pressure. 90 tablet 3   tirzepatide (MOUNJARO) 10  MG/0.5ML Pen INJECT 10 MG UNDER THE SKIN WEEKLY 6 mL 1   tirzepatide (MOUNJARO) 5 MG/0.5ML Pen Inject 10 mg into the skin once a week. 12 mL 3   venlafaxine XR (EFFEXOR-XR) 37.5 MG 24 hr capsule Take 37.5 mg by mouth daily.     No current facility-administered medications on file prior to visit.   Allergies  Allergen Reactions  Amoxicillin Anaphylaxis and Other (See Comments)    Has patient had a PCN reaction causing immediate rash, facial/tongue/throat swelling, SOB or lightheadedness with hypotension: Yes Has patient had a PCN reaction causing severe rash involving mucus membranes or skin necrosis: No Has patient had a PCN reaction that required hospitalization No Has patient had a PCN reaction occurring within the last 10 years: No If all of the above answers are "NO", then [name] proceed with Cephalosporin use.   Ciprofloxacin Anaphylaxis    Other reaction(s): anaphylaxis   Other Anaphylaxis    Pt states that she can only take Avelox, and Zithromax.     Penicillins Anaphylaxis and Other (See Comments)    Has patient had a PCN reaction causing immediate rash, facial/tongue/throat swelling, SOB or lightheadedness with hypotension: Yes Has patient had a PCN reaction causing severe rash involving mucus membranes or skin necrosis: No Has patient had a PCN reaction that required hospitalization No Has patient had a PCN reaction occurring within the last 10 years: No If all of the above answers are "NO", then [name] proceed with Cephalosporin use.   Sulfa Antibiotics Anaphylaxis and Other (See Comments)   Erythromycin Other (See Comments)    GI upset with oral regimen over 10 years ago Other reaction(s): anaphylaxis   Vicodin [Hydrocodone-Acetaminophen] Itching   Family History  Adopted: Yes  Problem Relation Age of Onset   Other Other        adopted   PE: BP 90/72   Pulse 88   Ht 5' (1.524 m)   Wt 136 lb (61.7 kg)   SpO2 97%   BMI 26.56 kg/m  Wt Readings from Last 30 Encounters:   05/24/23 136 lb (61.7 kg)  01/04/23 161 lb 9.6 oz (73.3 kg)  08/31/22 176 lb 6.4 oz (80 kg)  05/20/22 181 lb 6.4 oz (82.3 kg)  04/23/22 182 lb 1.6 oz (82.6 kg)  04/20/22 180 lb 3.2 oz (81.7 kg)  09/15/21 210 lb 3.2 oz (95.3 kg)  04/03/21 210 lb 3.2 oz (95.3 kg)  01/29/21 215 lb (97.5 kg)  11/18/20 242 lb 6.4 oz (110 kg)  08/12/20 234 lb 9.6 oz (106.4 kg)  07/15/20 236 lb 12.8 oz (107.4 kg)  01/26/20 223 lb 12.8 oz (101.5 kg)  01/16/20 228 lb 8 oz (103.6 kg)  01/15/20 (!) 217 lb (98.4 kg)  11/21/19 217 lb (98.4 kg)  06/08/19 218 lb 4 oz (99 kg)  03/22/19 203 lb (92.1 kg)  03/20/19 200 lb (90.7 kg)  03/13/19 203 lb 12 oz (92.4 kg)  12/12/18 211 lb (95.7 kg)  08/01/18 231 lb (104.8 kg)  07/07/18 238 lb (108 kg)  05/19/18 242 lb 12 oz (110.1 kg)  04/29/18 244 lb (110.7 kg)  04/07/18 242 lb (109.8 kg)  12/20/17 247 lb 9.6 oz (112.3 kg)  10/31/17 240 lb (108.9 kg)  05/12/17 219 lb 1.9 oz (99.4 kg)  04/26/17 216 lb (98 kg)  Highest: 285 lbs during pregnancy  Constitutional: slightly overweight, in NAD Eyes:  EOMI, no exophthalmos ENT: no neck masses, no cervical lymphadenopathy Cardiovascular: RRR, No MRG Respiratory: CTA B Musculoskeletal: no deformities Skin:no rashes Neurological: no tremor with outstretched hands Diabetic Foot Exam - Simple   Simple Foot Form Diabetic Foot exam was performed with the following findings: Yes 05/24/2023 11:32 AM  Visual Inspection No deformities, no ulcerations, no other skin breakdown bilaterally: Yes Sensation Testing Intact to touch and monofilament testing bilaterally: Yes Pulse Check Posterior Tibialis and Dorsalis pulse intact bilaterally:  Yes Comments    ASSESSMENT: 1. DM2, insulin-dependent, controlled, without long-term complications, but with hyperglycemia  Investigation for type 1 diabetes was negative: Component     Latest Ref Rng & Units 08/01/2018  C-Peptide     0.80 - 3.85 ng/mL 2.53  Islet Cell Ab     Neg:<1:1  Negative  Glucose, Plasma     65 - 99 mg/dL 409 (H)  ZNT8 Antibodies     U/mL <15  Glutamic Acid Decarb Ab     <5 IU/mL <5   2.  Hyperlipidemia  3.  Overweight  PLAN:  1. Patient with longstanding, uncontrolled, type 2 diabetes, on oral antidiabetic regimen with metformin ER but also long-acting insulin and weekly GLP-1/GIP receptor agonist, with significant improvement in diabetes control and weight after starting Mounjaro.  Latest HbA1c from 5 months ago was lower, at 5.1%.  She was off her CGM sensor and was also not checking blood sugars.  We discussed about possibly restarting the sensor not only for blood sugar control (alarms when sugars are trending low), but also behavioral modification to help with weight loss.  We are titrating her Toujeo dose down but we were not able to stop at last visit due to not checking blood sugars. -At today's visit, she is not checking blood sugars at all.  She stopped her Toujeo dose and continues only on Mounjaro and metformin for now.  Fortunately HbA1c today still excellent (see below).  Due to the significant weight loss, at today's visit I suggested to back off the Mounjaro dose.  She agrees with this.  However, she mentions that the majority of her weight loss is related to the dysgeusia-everything tastes bitter.  She is on a multivitamin.  We discussed about possibly trying zinc lozenges.  Will go ahead and decrease the Mounjaro dose.  At next visit, I am planning to decrease her metformin dose. - I suggested to:  Patient Instructions  Please continue: - Metformin ER 1000 mg 2x a day with meals  Please decrease: - Mounjaro 5 mg weekly  Please come back for a follow-up appointment in 4 months.  - we checked her HbA1c: 5.2% (slightly higher) - advised to check sugars at different times of the day - 1x a day, rotating check times - advised for yearly eye exams >> she is due - will check annual labs today - return to clinic in 4 months  2.   Hyperlipidemia -we reviewed the latest lipid panel from 08/2022: Fractions at goal -She is not on a statin due to previous intolerance.  We tried Zetia but she developed muscle aches and stopped. -At previous visits, I recommended the "portfolio diet" -Will recheck the level today  3.  Overweight -She continues Mounjaro, which should also help with weight loss.  However, we will back off the dose at today's visit. -She initially lost almost 70 pounds after starting it and was able to have skin removal surgery 3 months prior to our last visit.  Before last visit, she lost 15 more pounds. -At today's visit, she lost 25 more pounds since last visit!  Carlus Pavlov, MD PhD [facility]

## 2023-05-25 LAB — COMPREHENSIVE METABOLIC PANEL
AG Ratio: 1.5 (calc) (ref 1.0–2.5)
ALT: 18 U/L (ref 6–29)
AST: 20 U/L (ref 10–35)
Albumin: 4.6 g/dL (ref 3.6–5.1)
Alkaline phosphatase (APISO): 60 U/L (ref 37–153)
BUN/Creatinine Ratio: 22 (calc) (ref 6–22)
BUN: 42 mg/dL — ABNORMAL HIGH (ref 7–25)
CO2: 20 mmol/L (ref 20–32)
Calcium: 10 mg/dL (ref 8.6–10.4)
Chloride: 108 mmol/L (ref 98–110)
Creat: 1.88 mg/dL — ABNORMAL HIGH (ref 0.50–1.03)
Globulin: 3 g/dL (ref 1.9–3.7)
Glucose, Bld: 100 mg/dL — ABNORMAL HIGH (ref 65–99)
Potassium: 3.6 mmol/L (ref 3.5–5.3)
Sodium: 138 mmol/L (ref 135–146)
Total Bilirubin: 0.4 mg/dL (ref 0.2–1.2)
Total Protein: 7.6 g/dL (ref 6.1–8.1)

## 2023-05-27 LAB — COMPLETE METABOLIC PANEL WITH GFR
AG Ratio: 1.5 (calc) (ref 1.0–2.5)
ALT: 18 U/L (ref 6–29)
AST: 20 U/L (ref 10–35)
Albumin: 4.6 g/dL (ref 3.6–5.1)
Alkaline phosphatase (APISO): 60 U/L (ref 37–153)
BUN/Creatinine Ratio: 22 (calc) (ref 6–22)
BUN: 42 mg/dL — ABNORMAL HIGH (ref 7–25)
CO2: 20 mmol/L (ref 20–32)
Calcium: 10 mg/dL (ref 8.6–10.4)
Chloride: 108 mmol/L (ref 98–110)
Creat: 1.88 mg/dL — ABNORMAL HIGH (ref 0.50–1.03)
Globulin: 3 g/dL (ref 1.9–3.7)
Glucose, Bld: 100 mg/dL — ABNORMAL HIGH (ref 65–99)
Potassium: 3.6 mmol/L (ref 3.5–5.3)
Sodium: 138 mmol/L (ref 135–146)
Total Bilirubin: 0.4 mg/dL (ref 0.2–1.2)
Total Protein: 7.6 g/dL (ref 6.1–8.1)
eGFR: 31 mL/min/{1.73_m2} — ABNORMAL LOW (ref >=60)

## 2023-05-27 LAB — TEST AUTHORIZATION

## 2023-05-27 LAB — MICROALBUMIN / CREATININE URINE RATIO
Creatinine, Urine: 378 mg/dL — ABNORMAL HIGH (ref 20–275)
Microalb Creat Ratio: 9 mg/g{creat} (ref <30)
Microalb, Ur: 3.3 mg/dL

## 2023-05-27 LAB — LIPID PANEL
Cholesterol: 145 mg/dL (ref <200)
HDL: 48 mg/dL — ABNORMAL LOW (ref >=50)
LDL Cholesterol (Calc): 71 mg/dL
Non-HDL Cholesterol (Calc): 97 mg/dL (ref <130)
Total CHOL/HDL Ratio: 3 (calc) (ref <5.0)
Triglycerides: 183 mg/dL — ABNORMAL HIGH (ref <150)

## 2023-05-29 ENCOUNTER — Inpatient Hospital Stay (HOSPITAL_BASED_OUTPATIENT_CLINIC_OR_DEPARTMENT_OTHER)
Admission: EM | Admit: 2023-05-29 | Discharge: 2023-06-01 | DRG: 683 | Disposition: A | Discharge status: Home / Self Care | Attending: Internal Medicine | Admitting: Internal Medicine

## 2023-05-29 ENCOUNTER — Emergency Department (HOSPITAL_BASED_OUTPATIENT_CLINIC_OR_DEPARTMENT_OTHER): Payer: TRICARE For Life (TFL)

## 2023-05-29 ENCOUNTER — Other Ambulatory Visit: Payer: Self-pay

## 2023-05-29 DIAGNOSIS — Z882 Allergy status to sulfonamides status: Secondary | ICD-10-CM | POA: Diagnosis not present

## 2023-05-29 DIAGNOSIS — E860 Dehydration: Secondary | ICD-10-CM | POA: Diagnosis present

## 2023-05-29 DIAGNOSIS — Z7982 Long term (current) use of aspirin: Secondary | ICD-10-CM

## 2023-05-29 DIAGNOSIS — J45909 Unspecified asthma, uncomplicated: Secondary | ICD-10-CM | POA: Diagnosis present

## 2023-05-29 DIAGNOSIS — Z885 Allergy status to narcotic agent status: Secondary | ICD-10-CM

## 2023-05-29 DIAGNOSIS — E1142 Type 2 diabetes mellitus with diabetic polyneuropathy: Secondary | ICD-10-CM | POA: Diagnosis present

## 2023-05-29 DIAGNOSIS — K219 Gastro-esophageal reflux disease without esophagitis: Secondary | ICD-10-CM | POA: Diagnosis present

## 2023-05-29 DIAGNOSIS — G510 Bell's palsy: Secondary | ICD-10-CM

## 2023-05-29 DIAGNOSIS — R638 Other symptoms and signs concerning food and fluid intake: Secondary | ICD-10-CM

## 2023-05-29 DIAGNOSIS — E785 Hyperlipidemia, unspecified: Secondary | ICD-10-CM | POA: Diagnosis present

## 2023-05-29 DIAGNOSIS — I959 Hypotension, unspecified: Secondary | ICD-10-CM | POA: Diagnosis present

## 2023-05-29 DIAGNOSIS — E876 Hypokalemia: Secondary | ICD-10-CM | POA: Diagnosis present

## 2023-05-29 DIAGNOSIS — Z8673 Personal history of transient ischemic attack (TIA), and cerebral infarction without residual deficits: Secondary | ICD-10-CM | POA: Diagnosis not present

## 2023-05-29 DIAGNOSIS — N179 Acute kidney failure, unspecified: Secondary | ICD-10-CM | POA: Diagnosis not present

## 2023-05-29 DIAGNOSIS — Z7985 Long-term (current) use of injectable non-insulin antidiabetic drugs: Secondary | ICD-10-CM

## 2023-05-29 DIAGNOSIS — Z79899 Other long term (current) drug therapy: Secondary | ICD-10-CM

## 2023-05-29 DIAGNOSIS — Z880 Allergy status to penicillin: Secondary | ICD-10-CM

## 2023-05-29 DIAGNOSIS — Z9049 Acquired absence of other specified parts of digestive tract: Secondary | ICD-10-CM | POA: Diagnosis not present

## 2023-05-29 DIAGNOSIS — F32A Depression, unspecified: Secondary | ICD-10-CM | POA: Diagnosis present

## 2023-05-29 DIAGNOSIS — F419 Anxiety disorder, unspecified: Secondary | ICD-10-CM | POA: Diagnosis present

## 2023-05-29 DIAGNOSIS — Z888 Allergy status to other drugs, medicaments and biological substances status: Secondary | ICD-10-CM

## 2023-05-29 DIAGNOSIS — I10 Essential (primary) hypertension: Secondary | ICD-10-CM | POA: Diagnosis present

## 2023-05-29 DIAGNOSIS — Z7984 Long term (current) use of oral hypoglycemic drugs: Secondary | ICD-10-CM | POA: Diagnosis not present

## 2023-05-29 DIAGNOSIS — E874 Mixed disorder of acid-base balance: Secondary | ICD-10-CM | POA: Diagnosis present

## 2023-05-29 DIAGNOSIS — E871 Hypo-osmolality and hyponatremia: Secondary | ICD-10-CM | POA: Diagnosis present

## 2023-05-29 DIAGNOSIS — Z881 Allergy status to other antibiotic agents status: Secondary | ICD-10-CM | POA: Diagnosis not present

## 2023-05-29 LAB — CBC
HCT: 31.1 % — ABNORMAL LOW (ref 36.0–46.0)
Hemoglobin: 10.9 g/dL — ABNORMAL LOW (ref 12.0–15.0)
MCH: 30.1 pg (ref 26.0–34.0)
MCHC: 35 g/dL (ref 30.0–36.0)
MCV: 85.9 fL (ref 80.0–100.0)
Platelets: 219 10*3/uL (ref 150–400)
RBC: 3.62 MIL/uL — ABNORMAL LOW (ref 3.87–5.11)
RDW: 12.2 % (ref 11.5–15.5)
WBC: 7.5 10*3/uL (ref 4.0–10.5)
nRBC: 0 % (ref 0.0–0.2)

## 2023-05-29 LAB — COMPREHENSIVE METABOLIC PANEL
ALT: 17 U/L (ref 0–44)
AST: 17 U/L (ref 15–41)
Albumin: 4.3 g/dL (ref 3.5–5.0)
Alkaline Phosphatase: 57 U/L (ref 38–126)
Anion gap: 16 — ABNORMAL HIGH (ref 5–15)
BUN: 55 mg/dL — ABNORMAL HIGH (ref 6–20)
CO2: 16 mmol/L — ABNORMAL LOW (ref 22–32)
Calcium: 9 mg/dL (ref 8.9–10.3)
Chloride: 95 mmol/L — ABNORMAL LOW (ref 98–111)
Creatinine, Ser: 5.51 mg/dL — ABNORMAL HIGH (ref 0.44–1.00)
GFR, Estimated: 9 mL/min — ABNORMAL LOW (ref >60)
Glucose, Bld: 94 mg/dL (ref 70–99)
Potassium: 3.5 mmol/L (ref 3.5–5.1)
Sodium: 127 mmol/L — ABNORMAL LOW (ref 135–145)
Total Bilirubin: 0.5 mg/dL (ref <1.2)
Total Protein: 7.2 g/dL (ref 6.5–8.1)

## 2023-05-29 LAB — URINALYSIS, ROUTINE W REFLEX MICROSCOPIC
Bilirubin Urine: NEGATIVE
Glucose, UA: NEGATIVE mg/dL
Ketones, ur: NEGATIVE mg/dL
Leukocytes,Ua: NEGATIVE
Nitrite: NEGATIVE
Protein, ur: 30 mg/dL — AB
Specific Gravity, Urine: 1.011 (ref 1.005–1.030)
pH: 5 (ref 5.0–8.0)

## 2023-05-29 LAB — PREGNANCY, URINE: Preg Test, Ur: NEGATIVE

## 2023-05-29 LAB — LIPASE, BLOOD: Lipase: 152 U/L — ABNORMAL HIGH (ref 11–51)

## 2023-05-29 LAB — MAGNESIUM: Magnesium: 1.6 mg/dL — ABNORMAL LOW (ref 1.7–2.4)

## 2023-05-29 LAB — SODIUM, URINE, RANDOM: Sodium, Ur: 22 mmol/L

## 2023-05-29 LAB — CREATININE, URINE, RANDOM: Creatinine, Urine: 110 mg/dL

## 2023-05-29 MED ORDER — ONDANSETRON HCL 4 MG/2ML IJ SOLN
4.0000 mg | Freq: Once | INTRAMUSCULAR | Status: AC
  Administered 2023-05-29: 4 mg via INTRAVENOUS
  Filled 2023-05-29: qty 2

## 2023-05-29 MED ORDER — SODIUM CHLORIDE 0.9 % IV BOLUS
1000.0000 mL | Freq: Once | INTRAVENOUS | Status: AC
  Administered 2023-05-29: 1000 mL via INTRAVENOUS

## 2023-05-29 NOTE — ED Notes (Signed)
US at bedside

## 2023-05-29 NOTE — ED Provider Notes (Signed)
[facility]ERGENCY DEPARTMENT AT [facility] Provider Note   CSN: [id number] Arrival date & time: 05/29/23  1810     History {Add pertinent medical, surgical, social history, OB history to HPI:1} Chief Complaint  Patient presents with   Fatigue    Melissa [name] is a 57 y.o. female with a history of stroke, type 2 diabetes mellitus, and Bell's palsy who presents to the ED today for fatigue.  Patient reports fatigue and generalized weakness for the past 2 weeks with associated decreased oral intake and decreased urine output.  She had routine labs done with her endocrinologist 5 days ago, which showed decreased kidney function.  Patient states that she tried taking her blood pressure at home earlier today and kept getting error messages and could not get a read.  She was concerned about her blood pressure being low due to dehydration and decided come to the ED for further evaluation.  She endorses nausea but denies fever, vomiting, abdominal pain, or changes to bowel habits.  She denies pain or difficulty with urination.     Home Medications Prior to Admission medications   Medication Sig Start Date End Date Taking? Authorizing Provider  albuterol (VENTOLIN HFA) 108 (90 Base) MCG/ACT inhaler Inhale 2 puffs into the lungs every 4 (four) hours as needed for wheezing or shortness of breath. 03/12/22   Particia Nearing, PA-C  aspirin 81 MG chewable tablet Chew 81 mg by mouth daily.    [provider]  blood glucose meter kit and supplies KIT Dispense based on patient and insurance preference. Use 1x a day. (FOR ICD-9 250.00, 250.01). 04/07/18   Carlus Pavlov, MD  gabapentin (NEURONTIN) 300 MG capsule Take 300 mg by mouth 3 (three) times daily. 04/09/22   [provider]  glucose blood (FREESTYLE LITE) test strip Use as instructed, test blood sugar once a day 06/22/18   Carlus Pavlov, MD  insulin glargine, 2 Unit Dial, (TOUJEO MAX SOLOSTAR) 300 UNIT/ML  Solostar Pen Inject 22 Units into the skin at bedtime. For diabetes, per titration Patient not taking: Reported on 05/24/2023 08/31/22   Carlus Pavlov, MD  Insulin Pen Needle 32G X 4 MM MISC Use 1x a day 04/03/21   Carlus Pavlov, MD  Lancets MISC Use 1x a day 04/07/18   Carlus Pavlov, MD  lidocaine (LIDODERM) 5 % Place 1 patch onto the skin daily.    [provider]  metFORMIN (GLUCOPHAGE-XR) 500 MG 24 hr tablet Take 2 tablets (1,000 mg total) by mouth 2 (two) times daily with a meal. 05/24/23   Carlus Pavlov, MD  nebivolol (BYSTOLIC) 10 MG tablet Take 1 tablet (10 mg total) by mouth daily. 07/15/20   Doreene Nest, NP  ondansetron (ZOFRAN-ODT) 8 MG disintegrating tablet PLACE 1 TABLET ON THE TONGUE TWICE A DAY AS NEEDED FOR NAUSEA OR VOMITING 07/15/20   Doreene Nest, NP  telmisartan (MICARDIS) 40 MG tablet Take 1 tablet (40 mg total) by mouth daily. For blood pressure. 07/15/20   Doreene Nest, NP  tirzepatide [facility]) 5 MG/0.5ML Pen Inject 10 mg into the skin once a week. 05/24/23   Carlus Pavlov, MD  venlafaxine XR (EFFEXOR-XR) 37.5 MG 24 hr capsule Take 37.5 mg by mouth daily. 11/18/20   [provider]      Allergies    Amoxicillin, Ciprofloxacin, Other, Penicillins, Sulfa antibiotics, Erythromycin, and Vicodin [hydrocodone-acetaminophen]    Review of Systems   Review of Systems  Constitutional:  Positive for fatigue.  All other systems reviewed and are negative.   Physical Exam Updated Vital Signs BP 110/76   Pulse 78   Temp 98.1 F (36.7 C) (Oral)   Resp 18   Ht 5' (1.524 m)   Wt 61.2 kg   SpO2 100%   BMI 26.37 kg/m  Physical Exam Vitals and nursing note reviewed.  Constitutional:      General: She is not in acute distress.    Appearance: Normal appearance.  HENT:     Head: Normocephalic and atraumatic.     Mouth/Throat:     Mouth: Mucous membranes are moist.  Eyes:     Conjunctiva/sclera: Conjunctivae normal.      Pupils: Pupils are equal, round, and reactive to light.  Cardiovascular:     Rate and Rhythm: Normal rate and regular rhythm.     Pulses: Normal pulses.     Heart sounds: Normal heart sounds.  Pulmonary:     Effort: Pulmonary effort is normal.     Breath sounds: Normal breath sounds.  Abdominal:     Palpations: Abdomen is soft.     Tenderness: There is no abdominal tenderness.  Skin:    General: Skin is warm and dry.     Findings: No rash.  Neurological:     General: No focal deficit present.     Mental Status: She is alert.     Sensory: No sensory deficit.     Motor: No weakness.  Psychiatric:        Mood and Affect: Mood normal.        Behavior: Behavior normal.    ED Results / Procedures / Treatments   Labs (all labs ordered are listed, but only abnormal results are displayed) Labs Reviewed  LIPASE, BLOOD - Abnormal; Notable for the following components:      Result Value   Lipase 152 (*)    All other components within normal limits  COMPREHENSIVE METABOLIC PANEL - Abnormal; Notable for the following components:   Sodium 127 (*)    Chloride 95 (*)    CO2 16 (*)    BUN 55 (*)    Creatinine, Ser 5.51 (*)    GFR, Estimated 9 (*)    Anion gap 16 (*)    All other components within normal limits  CBC - Abnormal; Notable for the following components:   RBC 3.62 (*)    Hemoglobin 10.9 (*)    HCT 31.1 (*)    All other components within normal limits  URINALYSIS, ROUTINE W REFLEX MICROSCOPIC - Abnormal; Notable for the following components:   APPearance HAZY (*)    Hgb urine dipstick TRACE (*)    Protein, ur 30 (*)    Bacteria, UA RARE (*)    All other components within normal limits  MAGNESIUM - Abnormal; Notable for the following components:   Magnesium 1.6 (*)    All other components within normal limits  PREGNANCY, URINE  SODIUM, URINE, RANDOM  CREATININE, URINE, RANDOM    EKG EKG Interpretation Date/Time:  Saturday May 29 2023 18:34:37 EST Ventricular  Rate:  82 PR Interval:  181 QRS Duration:  79 QT Interval:  383 QTC Calculation: 448 R Axis:   58  Text Interpretation: Sinus rhythm Low voltage, precordial leads Confirmed by Vivi Barrack [phone]) on 05/29/2023 7:39:10 PM  Radiology US Renal Result Date: 05/29/2023 CLINICAL DATA:  AKI EXAM: RENAL / URINARY TRACT ULTRASOUND COMPLETE COMPARISON:  None Available. FINDINGS: The right kidney measured 10.4 cm and the  left kidney measured 10.2 cm. The kidneys demonstrate normal echogenicity. No renal parenchymal lesions are identified. No shadowing stones are seen. No hydronephrosis. Bladder: Appears normal for degree of bladder distention. IMPRESSION: Unremarkable examination of the kidneys and bladder. Electronically Signed   By: Layla Maw M.D.   On: 05/29/2023 20:17    Procedures Procedures  {Document cardiac monitor, telemetry assessment procedure when appropriate:1}  Medications Ordered in ED Medications  sodium chloride 0.9 % bolus 1,000 mL (has no administration in time range)    ED Course/ Medical Decision Making/ A&P   {   Click here for ABCD2, HEART and other calculatorsREFRESH Note before signing :1}                              Medical Decision Making Amount and/or Complexity of Data Reviewed Labs: ordered. Radiology: ordered.  Risk Decision regarding hospitalization.   ***  {Document critical care time when appropriate:1} {Document review of labs and clinical decision tools ie heart score, Chads2Vasc2 etc:1}  {Document your independent review of radiology images, and any outside records:1} {Document your discussion with family members, caretakers, and with consultants:1} {Document social determinants of health affecting pt's care:1} {Document your decision making why or why not admission, treatments were needed:1} Final Clinical Impression(s) / ED Diagnoses Final diagnoses:  AKI (acute kidney injury) (HCC)  Hyponatremia  Decreased oral intake    Rx /  DC Orders ED Discharge Orders     None

## 2023-05-29 NOTE — ED Notes (Signed)
Pt unable to provide urine sample at this time. Will call when able to.

## 2023-05-29 NOTE — ED Notes (Signed)
EDP at bedside  

## 2023-05-29 NOTE — ED Triage Notes (Signed)
Pt POV reporting fatigue/weakness x2 weeks, also reporting decreased PO intake. Pt concerned for dehydration.

## 2023-05-29 NOTE — ED Notes (Signed)
Manual BP sys 80's. 1L NS bolused. Jearld Fenton MD notified. Pt remains alert and oriented. Secure in bed.

## 2023-05-29 NOTE — Progress Notes (Signed)
Plan of Care Note for accepted transfer   Patient: Melissa [name] MRN: [id number]   DOA: 05/29/2023  Facility requesting transfer: [facility]   Requesting Provider: Dr. Jearld Fenton   Reason for transfer: AKI   Facility course: 57 yr old female with DM, HLD, Bell's palsy, and hx of CVA who p/w 2 wks of poor appetite, weakness, and fatigue.   BP was as low as 82/55 and has improved with IVF. SCr is 5.51 with CO2 16, and sodium 127. Renal US is unremarkable.   She was given 1 liter NS and Zofran.   Plan of care: The patient is accepted for admission to Progressive unit, at [facility].   Author: Briscoe Deutscher, MD 05/29/2023  Check [email or web address] for on-call coverage.  Nursing staff, Please call [facility] Admits & Consults System-Wide number on Amion as soon as patient's arrival, so appropriate admitting provider can evaluate the pt.

## 2023-05-30 DIAGNOSIS — N179 Acute kidney failure, unspecified: Principal | ICD-10-CM

## 2023-05-30 LAB — RENAL FUNCTION PANEL
Albumin: 3.2 g/dL — ABNORMAL LOW (ref 3.5–5.0)
Anion gap: 8 (ref 5–15)
BUN: 49 mg/dL — ABNORMAL HIGH (ref 6–20)
CO2: 16 mmol/L — ABNORMAL LOW (ref 22–32)
Calcium: 7.9 mg/dL — ABNORMAL LOW (ref 8.9–10.3)
Chloride: 103 mmol/L (ref 98–111)
Creatinine, Ser: 4.47 mg/dL — ABNORMAL HIGH (ref 0.44–1.00)
GFR, Estimated: 11 mL/min — ABNORMAL LOW (ref >60)
Glucose, Bld: 89 mg/dL (ref 70–99)
Phosphorus: 4.3 mg/dL (ref 2.5–4.6)
Potassium: 2.7 mmol/L — CL (ref 3.5–5.1)
Sodium: 127 mmol/L — ABNORMAL LOW (ref 135–145)

## 2023-05-30 LAB — CBC
HCT: 26.3 % — ABNORMAL LOW (ref 36.0–46.0)
Hemoglobin: 9.3 g/dL — ABNORMAL LOW (ref 12.0–15.0)
MCH: 30.1 pg (ref 26.0–34.0)
MCHC: 35.4 g/dL (ref 30.0–36.0)
MCV: 85.1 fL (ref 80.0–100.0)
Platelets: 175 10*3/uL (ref 150–400)
RBC: 3.09 MIL/uL — ABNORMAL LOW (ref 3.87–5.11)
RDW: 12.1 % (ref 11.5–15.5)
WBC: 6 10*3/uL (ref 4.0–10.5)
nRBC: 0 % (ref 0.0–0.2)

## 2023-05-30 LAB — BASIC METABOLIC PANEL
Anion gap: 8 (ref 5–15)
BUN: 37 mg/dL — ABNORMAL HIGH (ref 6–20)
CO2: 16 mmol/L — ABNORMAL LOW (ref 22–32)
Calcium: 8.4 mg/dL — ABNORMAL LOW (ref 8.9–10.3)
Chloride: 108 mmol/L (ref 98–111)
Creatinine, Ser: 2.9 mg/dL — ABNORMAL HIGH (ref 0.44–1.00)
GFR, Estimated: 19 mL/min — ABNORMAL LOW (ref >60)
Glucose, Bld: 110 mg/dL — ABNORMAL HIGH (ref 70–99)
Potassium: 4.7 mmol/L (ref 3.5–5.1)
Sodium: 132 mmol/L — ABNORMAL LOW (ref 135–145)

## 2023-05-30 LAB — MAGNESIUM
Magnesium: 1.6 mg/dL — ABNORMAL LOW (ref 1.7–2.4)
Magnesium: 2.4 mg/dL (ref 1.7–2.4)

## 2023-05-30 LAB — GLUCOSE, CAPILLARY
Glucose-Capillary: 76 mg/dL (ref 70–99)
Glucose-Capillary: 82 mg/dL (ref 70–99)

## 2023-05-30 LAB — VITAMIN B12: Vitamin B-12: 374 pg/mL (ref 180–914)

## 2023-05-30 LAB — CORTISOL: Cortisol, Plasma: 10.7 ug/dL

## 2023-05-30 LAB — CORTISOL-AM, BLOOD: Cortisol - AM: 8 ug/dL (ref 6.7–22.6)

## 2023-05-30 MED ORDER — SODIUM CHLORIDE 0.9 % IV SOLN
INTRAVENOUS | Status: DC
Start: 2023-05-30 — End: 2023-05-31

## 2023-05-30 MED ORDER — MELATONIN 5 MG PO TABS
5.0000 mg | ORAL_TABLET | Freq: Every evening | ORAL | Status: DC | PRN
  Administered 2023-05-30: 5 mg via ORAL
  Filled 2023-05-30: qty 1

## 2023-05-30 MED ORDER — POLYETHYLENE GLYCOL 3350 17 G PO PACK: 17.0000 g | PACK | Freq: Every day | ORAL | Status: DC | PRN

## 2023-05-30 MED ORDER — CEFTRIAXONE SODIUM 1 G IJ SOLR
1.0000 g | INTRAMUSCULAR | Status: DC
  Administered 2023-05-30 – 2023-06-01 (×3): 1 g via INTRAVENOUS
  Filled 2023-05-30 (×3): qty 10

## 2023-05-30 MED ORDER — DICYCLOMINE HCL 10 MG/5ML PO SOLN
10.0000 mg | Freq: Once | ORAL | Status: AC
  Administered 2023-05-30: 10 mg via ORAL
  Filled 2023-05-30: qty 5

## 2023-05-30 MED ORDER — ASPIRIN 81 MG PO CHEW
81.0000 mg | CHEWABLE_TABLET | Freq: Every day | ORAL | Status: DC
  Administered 2023-05-30 – 2023-06-01 (×3): 81 mg via ORAL
  Filled 2023-05-30 (×3): qty 1

## 2023-05-30 MED ORDER — POTASSIUM CHLORIDE CRYS ER 20 MEQ PO TBCR
40.0000 meq | EXTENDED_RELEASE_TABLET | Freq: Four times a day (QID) | ORAL | Status: AC
  Administered 2023-05-30 (×2): 40 meq via ORAL
  Filled 2023-05-30 (×2): qty 2

## 2023-05-30 MED ORDER — POTASSIUM CHLORIDE 10 MEQ/100ML IV SOLN: 10.0000 meq | INTRAVENOUS | Status: DC

## 2023-05-30 MED ORDER — MAGNESIUM OXIDE -MG SUPPLEMENT 400 (240 MG) MG PO TABS
800.0000 mg | ORAL_TABLET | Freq: Once | ORAL | Status: AC
  Administered 2023-05-30: 800 mg via ORAL
  Filled 2023-05-30: qty 2

## 2023-05-30 MED ORDER — PROCHLORPERAZINE EDISYLATE 10 MG/2ML IJ SOLN: 5.0000 mg | Freq: Four times a day (QID) | INTRAMUSCULAR | Status: DC | PRN

## 2023-05-30 MED ORDER — VENLAFAXINE HCL ER 37.5 MG PO CP24
37.5000 mg | ORAL_CAPSULE | Freq: Every day | ORAL | Status: DC
  Filled 2023-05-30 (×3): qty 1

## 2023-05-30 MED ORDER — POTASSIUM CHLORIDE 10 MEQ/100ML IV SOLN
10.0000 meq | INTRAVENOUS | Status: AC
  Administered 2023-05-30 (×2): 10 meq via INTRAVENOUS
  Filled 2023-05-30 (×2): qty 100

## 2023-05-30 MED ORDER — POTASSIUM CHLORIDE CRYS ER 20 MEQ PO TBCR: 40.0000 meq | EXTENDED_RELEASE_TABLET | Freq: Once | ORAL | Status: DC

## 2023-05-30 MED ORDER — HEPARIN SODIUM (PORCINE) 5000 UNIT/ML IJ SOLN
5000.0000 [IU] | Freq: Three times a day (TID) | INTRAMUSCULAR | Status: DC
  Administered 2023-05-30 – 2023-06-01 (×7): 5000 [IU] via SUBCUTANEOUS
  Filled 2023-05-30 (×7): qty 1

## 2023-05-30 MED ORDER — SODIUM BICARBONATE 650 MG PO TABS
650.0000 mg | ORAL_TABLET | Freq: Three times a day (TID) | ORAL | Status: DC
  Administered 2023-05-30 (×2): 650 mg via ORAL
  Filled 2023-05-30 (×2): qty 1

## 2023-05-30 MED ORDER — POTASSIUM CHLORIDE CRYS ER 20 MEQ PO TBCR
20.0000 meq | EXTENDED_RELEASE_TABLET | Freq: Once | ORAL | Status: AC
  Administered 2023-05-30: 20 meq via ORAL
  Filled 2023-05-30: qty 1

## 2023-05-30 MED ORDER — THIAMINE HCL 100 MG/ML IJ SOLN
500.0000 mg | INTRAVENOUS | Status: DC
  Administered 2023-05-30 – 2023-05-31 (×2): 500 mg via INTRAVENOUS
  Filled 2023-05-30 (×2): qty 5

## 2023-05-30 MED ORDER — ACETAMINOPHEN 325 MG PO TABS: 650.0000 mg | ORAL_TABLET | Freq: Four times a day (QID) | ORAL | Status: DC | PRN

## 2023-05-30 MED ORDER — MAGNESIUM SULFATE 2 GM/50ML IV SOLN
2.0000 g | Freq: Once | INTRAVENOUS | Status: AC
  Administered 2023-05-30: 2 g via INTRAVENOUS
  Filled 2023-05-30: qty 50

## 2023-05-30 NOTE — Evaluation (Signed)
Physical Therapy Evaluation Patient Details Name: Melissa [name] MRN: [id number] DOB: 09-27-1965 Today's Date: 05/30/2023  History of Present Illness  Pt is 57 yo presenting to [facility] ED due to fatigue and generalized weakness for the past 2 weeks with decreased oral intake and urine output. PMH: CVA, DM II, Bell's palsy, asthma, Dysrhythmia, GERD, vertigo.  Clinical Impression  Pt is presenting close to baseline level of functioning. Ind with all functional activities including bed mobility, sit to stand and short distance gait from bathroom with family. Pt BP is soft without significant change with position changes. Currently pt is presenting at baseline level of functioning and no skilled physical therapy services recommended. Pt will be discharged from skilled physical therapy services at this time; please re-consult if further needs arise.                Equipment Recommendations None recommended by PT     Functional Status Assessment Patient has not had a recent decline in their functional status     Precautions / Restrictions Precautions Precautions: None Restrictions Weight Bearing Restrictions Per Provider Order: No      Mobility  Bed Mobility Overal bed mobility: Independent        Transfers Overall transfer level: Independent      Ambulation/Gait Ambulation/Gait assistance: Independent Gait Distance (Feet): 10 Feet Assistive device: None Gait Pattern/deviations: WFL(Within Functional Limits)   Gait velocity interpretation: >2.62 ft/sec, indicative of community ambulatory   General Gait Details: some assistance from family with lines.     Balance Overall balance assessment: Independent           Pertinent Vitals/Pain Pain Assessment Pain Assessment: No/denies pain    Home Living Family/patient expects to be discharged to:: Private residence Living Arrangements: Spouse/significant other Available Help at Discharge: Family Type of Home: House Home  Access: Stairs to enter Entrance Stairs-Rails: Right;Left;Can reach both Entrance Stairs-Number of Steps: 3 Alternate Level Stairs-Number of Steps: 13 Home Layout: Two level Home Equipment: None      Prior Function Prior Level of Function : Independent/Modified Independent;Driving;Working/employed       Extremity/Trunk Assessment   Upper Extremity Assessment Upper Extremity Assessment: Overall WFL for tasks assessed    Lower Extremity Assessment Lower Extremity Assessment: Overall WFL for tasks assessed    Cervical / Trunk Assessment Cervical / Trunk Assessment: Normal  Communication   Communication Communication: No apparent difficulties  Cognition Arousal: Alert   Overall Cognitive Status: Within Functional Limits for tasks assessed        General Comments General comments (skin integrity, edema, etc.): no noted skin issues outside of gown. Supine BP: 94/60, Sitting: 91/60, standing 0 min: 88/59, standing 3 min: 89/57        Assessment/Plan    PT Assessment Patient does not need any further PT services         PT Goals (Current goals can be found in the Care Plan section)  Acute Rehab PT Goals PT Goal Formulation: All assessment and education complete, DC therapy        AM-PAC PT "6 Clicks" Mobility  Outcome Measure Help needed turning from your back to your side while in a flat bed without using bedrails?: None Help needed moving from lying on your back to sitting on the side of a flat bed without using bedrails?: None Help needed moving to and from a bed to a chair (including a wheelchair)?: None Help needed standing up from a chair using your arms (e.g., wheelchair or  bedside chair)?: None Help needed to walk in hospital room?: None Help needed climbing 3-5 steps with a railing? : None 6 Click Score: 24    End of Session   Activity Tolerance: Patient tolerated treatment well Patient left: in bed;with call bell/phone within reach;with family/visitor  present Nurse Communication: Mobility status      Time: 1419-1436 PT Time Calculation (min) (ACUTE ONLY): 17 min   Charges:   PT Evaluation $PT Eval Low Complexity: 1 Low   PT General Charges $$ ACUTE PT VISIT: 1 Visit         Harrel Carina, DPT, CLT  Acute Rehabilitation Services Office: [phone] (Secure chat preferred)   Claudia Desanctis 05/30/2023, 2:39 PM

## 2023-05-30 NOTE — Plan of Care (Signed)

## 2023-05-30 NOTE — Plan of Care (Signed)
  Problem: Education: Goal: Knowledge of General Education information will improve Description: Including pain rating scale, medication(s)/side effects and non-pharmacologic comfort measures Outcome: Progressing   Problem: Health Behavior/Discharge Planning: Goal: Ability to manage health-related needs will improve Outcome: Progressing   Problem: Clinical Measurements: Goal: Diagnostic test results will improve Outcome: Progressing Goal: Respiratory complications will improve Outcome: Progressing   Problem: Coping: Goal: Level of anxiety will decrease Outcome: Progressing

## 2023-05-30 NOTE — Progress Notes (Signed)
PROGRESS NOTE    Melissa [name]  XBM:[id number] DOB: 24-Oct-1965 DOA: 05/29/2023 PCP: Street, Stephanie Coup, MD (Confirm with patient/family/NH records and if not entered, this HAS to be entered at [facility] point of entry. "No PCP" if truly none.)   Chief Complaint  Patient presents with   Fatigue    Brief Narrative:   This is no charge note as patient was seen and admitted overnight by Dr. Margo Aye, chart, imaging labs were reviewed, patient was seen and examined   Assessment & Plan:   Principal Problem:   ARF (acute renal failure) (HCC)    AKI -Renal, volume depletion and dehydration, with hypotension, in the setting of poor oral intake while using Mounjaro, and she is on antihypertensive medication including telmisartan and Bystolic. -Annual ultrasound with no acute findings -Continue with IV fluids, avoid low blood pressure, increased her fluid to 125 cc/h, despite that blood pressure remains low, will give fluid bolus   High anion gap metabolic acidosis Serum bicarb 16, anion gap 16. Continue IV fluid hydration Obtain lactic acid level and VBG   History of type 2 diabetes, now controlled Hemoglobin A1c 5.2 on 05/24/23. Hemoglobin A1c previously 9.5 in 2022  Hold off home Mounjaro and other hypoglycemics Monitor CBG without sliding scale coverage   Diabetes polyneuropathy Hold off gabapentin for now due to severe AKI with GFR of 9. Monitor for now   History of SVT Rate is currently controlled. BPs are soft Hold off home Bystolic to avoid hypotension. Closely monitor on telemetry   Chronic anxiety/depression Resume home Effexor.   Generalized weakness fatigue PT OT assessment Early mobilization as tolerated Fall precautions   History of CVA Resume home aspirin for secondary CVA prevention The patient is not on statin prior to admission PT OT evaluation Fall precautions  Hyponatremia Hypokalemia Hypomagnesemia -Continue to replace aggressively and monitor  closely, continue with IV fluids     DVT prophylaxis: Chrisney heparin Code Status: Full code Family Communication: Discussed with husband at bedside Disposition:   Status is: Inpatient    Consultants:  None  Subjective: She reports generalized weakness and fatigue  Objective: Vitals:   05/29/23 2200 05/29/23 2335 05/30/23 0300 05/30/23 0800  BP: 119/71 107/61 110/67 (!) 87/55  Pulse: 80 84 85 78  Resp: 16 17 12 11   Temp:  98 F (36.7 C) 98 F (36.7 C)   TempSrc:  Oral Oral   SpO2: 100% 100% 100% 97%  Weight:      Height:        Intake/Output Summary (Last 24 hours) at 05/30/2023 1600 Last data filed at 05/30/2023 0300 Gross per 24 hour  Intake 299.13 ml  Output --  Net 299.13 ml   Filed Weights   05/29/23 1829  Weight: 61.2 kg    Examination:  Awake Alert, Oriented X 3, No new F.N deficits, Normal affect Symmetrical Chest wall movement, Good air movement bilaterally, CTAB RRR,No Gallops,Rubs or new Murmurs, No Parasternal Heave +ve B.Sounds, Abd Soft, No tenderness, No rebound - guarding or rigidity. No Cyanosis, Clubbing or edema, No new Rash or bruise     Data Reviewed: I have personally reviewed following labs and imaging studies  CBC: Recent Labs  Lab 05/29/23 1833 05/30/23 0550  WBC 7.5 6.0  HGB 10.9* 9.3*  HCT 31.1* 26.3*  MCV 85.9 85.1  PLT 219 175    Basic Metabolic Panel: Recent Labs  Lab 05/24/23 1202 05/29/23 1833 05/30/23 0550  NA 138  138 127* 127*  K 3.6  3.6 3.5 2.7*  CL 108  108 95* 103  CO2 20  20 16* 16*  GLUCOSE 100*  100* 94 89  BUN 42*  42* 55* 49*  CREATININE 1.88*  1.88* 5.51* 4.47*  CALCIUM 10.0  10.0 9.0 7.9*  MG  --  1.6* 1.6*  PHOS  --   --  4.3    GFR: Estimated Creatinine Clearance: 11.6 mL/min (A) (by C-G formula based on SCr of 4.47 mg/dL (H)).  Liver Function Tests: Recent Labs  Lab 05/24/23 1202 05/29/23 1833 05/30/23 0550  AST 20  20 17   --   ALT 18  18 17   --   ALKPHOS  --  57  --    BILITOT 0.4  0.4 0.5  --   PROT 7.6  7.6 7.2  --   ALBUMIN  --  4.3 3.2*    CBG: Recent Labs  Lab 05/30/23 0146  GLUCAP 76     No results found for this or any previous visit (from the past 240 hours).       Radiology Studies: US Renal Result Date: 05/29/2023 CLINICAL DATA:  AKI EXAM: RENAL / URINARY TRACT ULTRASOUND COMPLETE COMPARISON:  None Available. FINDINGS: The right kidney measured 10.4 cm and the left kidney measured 10.2 cm. The kidneys demonstrate normal echogenicity. No renal parenchymal lesions are identified. No shadowing stones are seen. No hydronephrosis. Bladder: Appears normal for degree of bladder distention. IMPRESSION: Unremarkable examination of the kidneys and bladder. Electronically Signed   By: Layla Maw M.D.   On: 05/29/2023 20:17        Scheduled Meds:  aspirin  81 mg Oral Daily   heparin  5,000 Units Subcutaneous Q8H   venlafaxine XR  37.5 mg Oral Daily   Continuous Infusions:  sodium chloride 125 mL/hr at 05/30/23 1210   cefTRIAXone (ROCEPHIN)  IV 1 g (05/30/23 0143)     LOS: 1 day       Huey Bienenstock, MD [facility]   To contact the attending provider between 7A-7P or the covering provider during after hours 7P-7A, please log into the web site [email or web address] and access using universal [place] password for that web site. If you do not have the password, please call the hospital operator.  05/30/2023, 4:00 PM

## 2023-05-30 NOTE — H&P (Signed)
History and Physical  Melissa [name] ZOX:[id number] DOB: 1965/09/12 DOA: 05/29/2023  Referring physician: Accepted by Dr. Antionette Char, [facility]  PCP: Street, Stephanie Coup, MD  Outpatient Specialists: Endocrinology Patient coming from: Home through [facility] ED.  Chief Complaint: Poor appetite, generalized weakness  HPI: Melissa [name] is a 57 y.o. female with medical history significant for type 2 diabetes on Mounjaro prescribed by her endocrinologist (Since beginning of January 2024), previous obesity (lost > 100 lbs in 1 year), diabetic polyneuropathy, SVT on Bystolic, Bell's palsy, history of CVA, history of loose stools post cholecystectomy, who initially presented to [facility] ED with complaints of 2 weeks of poor appetite, poor oral intake, generalized weakness and fatigue.  No reported subjective fevers or chills.  No reported cardiopulmonary symptoms.  In the ED, hypotensive, responded well to IV fluid.  States she does not have a history of hypertension and was placed on Bystolic for tachycardia and telmisartan for kidneys health.    Lab work was notable for severely elevated creatinine > 5.5, above normal baseline, high anion gap metabolic acidosis with serum bicarb of 16, hyponatremia 127.  The patient had a renal ultrasound done in the ED which was unremarkable.  She received 1 L IV fluid bolus in the ED as well as IV Zofran.  TRH, hospitalist service, was asked to admit.  ED Course: Temperature 98.1.  BP 89/56, pulse 80, respiratory 18, O2 saturation 100% on room air.  Review of Systems: Review of systems as noted in the HPI. All other systems reviewed and are negative.   Past Medical History:  Diagnosis Date   Asthma    Bell's palsy    Complication of anesthesia    Diabetes mellitus without complication (HCC)    Type II   Dysrhythmia    Tachycardia   GERD (gastroesophageal reflux disease)    Pneumonia    2014- 'Walking"   Shortness of breath dyspnea    with exertion   Stroke [facility])     Vertigo    Past Surgical History:  Procedure Laterality Date   CHOLECYSTECTOMY  1997   COLONOSCOPY  2015   CYSTOSCOPY     age 62- stretched stem of bladder   EYE SURGERY     Baby- Strabismus   SHOULDER ARTHROSCOPY Left 11/20/2014   Procedure: ARTHROSCOPY SHOULDER WITH MUA, ROTATOR INTERVAL RELEASE;  Surgeon: Cammy Copa, MD;  Location: [facility] OR;  Service: Orthopedics;  Laterality: Left;  LEFT SHOULDER MUA, DOA, ROTATOR INTERVAL RELEASE.   TONSILLECTOMY  2010    Social History:  reports that she has never smoked. She has never used smokeless tobacco. She reports that she does not currently use alcohol. She reports that she does not use drugs.   Allergies  Allergen Reactions   Amoxicillin Anaphylaxis and Other (See Comments)    Has patient had a PCN reaction causing immediate rash, facial/tongue/throat swelling, SOB or lightheadedness with hypotension: Yes Has patient had a PCN reaction causing severe rash involving mucus membranes or skin necrosis: No Has patient had a PCN reaction that required hospitalization No Has patient had a PCN reaction occurring within the last 10 years: No If all of the above answers are "NO", then may proceed with Cephalosporin use.   Ciprofloxacin Anaphylaxis    Other reaction(s): anaphylaxis   Other Anaphylaxis    Pt states that she can only take Avelox, and Zithromax.     Penicillins Anaphylaxis and Other (See Comments)    Has patient had a PCN reaction causing  immediate rash, facial/tongue/throat swelling, SOB or lightheadedness with hypotension: Yes Has patient had a PCN reaction causing severe rash involving mucus membranes or skin necrosis: No Has patient had a PCN reaction that required hospitalization No Has patient had a PCN reaction occurring within the last 10 years: No If all of the above answers are "NO", then may proceed with Cephalosporin use.   Sulfa Antibiotics Anaphylaxis and Other (See Comments)    Pt states she can take sulfa  abx at this time   Erythromycin Other (See Comments)    GI upset with oral regimen over 10 years ago Other reaction(s): anaphylaxis   Vicodin [Hydrocodone-Acetaminophen] Itching    Family History  Adopted: Yes  Problem Relation Age of Onset   Other Other        adopted      Prior to Admission medications   Medication Sig Start Date End Date Taking? Authorizing Provider  albuterol (VENTOLIN HFA) 108 (90 Base) MCG/ACT inhaler Inhale 2 puffs into the lungs every 4 (four) hours as needed for wheezing or shortness of breath. 03/12/22   Particia Nearing, PA-C  aspirin 81 MG chewable tablet Chew 81 mg by mouth daily.    [provider]  blood glucose meter kit and supplies KIT Dispense based on patient and insurance preference. Use 1x a day. (FOR ICD-9 250.00, 250.01). 04/07/18   Carlus Pavlov, MD  gabapentin (NEURONTIN) 300 MG capsule Take 300 mg by mouth 3 (three) times daily. 04/09/22   [provider]  glucose blood (FREESTYLE LITE) test strip Use as instructed, test blood sugar once a day 06/22/18   Carlus Pavlov, MD  insulin glargine, 2 Unit Dial, (TOUJEO MAX SOLOSTAR) 300 UNIT/ML Solostar Pen Inject 22 Units into the skin at bedtime. For diabetes, per titration Patient not taking: Reported on 05/24/2023 08/31/22   Carlus Pavlov, MD  Insulin Pen Needle 32G X 4 MM MISC Use 1x a day 04/03/21   Carlus Pavlov, MD  Lancets MISC Use 1x a day 04/07/18   Carlus Pavlov, MD  lidocaine (LIDODERM) 5 % Place 1 patch onto the skin daily.    [provider]  metFORMIN (GLUCOPHAGE-XR) 500 MG 24 hr tablet Take 2 tablets (1,000 mg total) by mouth 2 (two) times daily with a meal. 05/24/23   Carlus Pavlov, MD  nebivolol (BYSTOLIC) 10 MG tablet Take 1 tablet (10 mg total) by mouth daily. 07/15/20   Doreene Nest, NP  ondansetron (ZOFRAN-ODT) 8 MG disintegrating tablet PLACE 1 TABLET ON THE TONGUE TWICE A DAY AS NEEDED FOR NAUSEA OR VOMITING 07/15/20    Doreene Nest, NP  telmisartan (MICARDIS) 40 MG tablet Take 1 tablet (40 mg total) by mouth daily. For blood pressure. 07/15/20   Doreene Nest, NP  tirzepatide [facility]) 5 MG/0.5ML Pen Inject 10 mg into the skin once a week. 05/24/23   Carlus Pavlov, MD  venlafaxine XR (EFFEXOR-XR) 37.5 MG 24 hr capsule Take 37.5 mg by mouth daily. 11/18/20   [provider]    Physical Exam: BP 107/61 (BP Location: Left Arm)   Pulse 84   Temp 98.1 F (36.7 C) (Oral)   Resp 17   Ht 5' (1.524 m)   Wt 61.2 kg   SpO2 100%   BMI 26.37 kg/m   General: 57 y.o. year-old female well developed well nourished in no acute distress.  Alert and oriented x3. Cardiovascular: Regular rate and rhythm with no rubs or gallops.  No thyromegaly or JVD noted.  No lower extremity edema. 2/4 pulses in all 4 extremities. Respiratory: Clear to auscultation with no wheezes or rales. Good inspiratory effort. Abdomen: Soft nontender nondistended with normal bowel sounds x4 quadrants. Muskuloskeletal: No cyanosis, clubbing or edema noted bilaterally Neuro: CN II-XII intact, strength, sensation, reflexes Skin: No ulcerative lesions noted or rashes Psychiatry: Judgement and insight appear normal. Mood is appropriate for condition and setting          Labs on Admission:  Basic Metabolic Panel: Recent Labs  Lab 05/24/23 1202 05/29/23 1833  NA 138  138 127*  K 3.6  3.6 3.5  CL 108  108 95*  CO2 20  20 16*  GLUCOSE 100*  100* 94  BUN 42*  42* 55*  CREATININE 1.88*  1.88* 5.51*  CALCIUM 10.0  10.0 9.0  MG  --  1.6*   Liver Function Tests: Recent Labs  Lab 05/24/23 1202 05/29/23 1833  AST 20  20 17   ALT 18  18 17   ALKPHOS  --  57  BILITOT 0.4  0.4 0.5  PROT 7.6  7.6 7.2  ALBUMIN  --  4.3   Recent Labs  Lab 05/29/23 1833  LIPASE 152*   No results for input(s): "AMMONIA" in the last 168 hours. CBC: Recent Labs  Lab 05/29/23 1833  WBC 7.5  HGB 10.9*  HCT 31.1*  MCV 85.9   PLT 219   Cardiac Enzymes: No results for input(s): "CKTOTAL", "CKMB", "CKMBINDEX", "TROPONINI" in the last 168 hours.  BNP (last 3 results) No results for input(s): "BNP" in the last 8760 hours.  ProBNP (last 3 results) No results for input(s): "PROBNP" in the last 8760 hours.  CBG: No results for input(s): "GLUCAP" in the last 168 hours.  Radiological Exams on Admission: US Renal Result Date: 05/29/2023 CLINICAL DATA:  AKI EXAM: RENAL / URINARY TRACT ULTRASOUND COMPLETE COMPARISON:  None Available. FINDINGS: The right kidney measured 10.4 cm and the left kidney measured 10.2 cm. The kidneys demonstrate normal echogenicity. No renal parenchymal lesions are identified. No shadowing stones are seen. No hydronephrosis. Bladder: Appears normal for degree of bladder distention. IMPRESSION: Unremarkable examination of the kidneys and bladder. Electronically Signed   By: Layla Maw M.D.   On: 05/29/2023 20:17    EKG: I independently viewed the EKG done and my findings are as followed: Sinus rhythm rate of 82.  Nonspecific ST segment.  QTc 442.  Assessment/Plan Present on Admission:  ARF (acute renal failure) (HCC)  Principal Problem:   ARF (acute renal failure) (HCC)  AKI, prerenal in setting of poor intake, on Mounjaro Presented with creatinine 5.51 with GFR of 9, from creatinine 1.0 and GFR greater than 60 at baseline Reports poor appetite, also reports greater than 100 pounds weight loss in the last 1 to 1&1/2 years, suspect poor appetite may be a side effect of Mounjaro Hold off home [place], started in early January 2024 Renal ultrasound unremarkable Continue IV fluid hydration NS 100 cc/h x 1 day Avoid nephrotoxic agents, dehydration, and hypotension Hold off home telmisartan Closely monitor urine output with strict I's and Os Repeat renal function panel in the morning, if worsening, consult nephrology.  High anion gap metabolic acidosis Serum bicarb 16, anion gap  16. Continue IV fluid hydration Obtain lactic acid level and VBG  History of type 2 diabetes, now controlled Hemoglobin A1c 5.2 on 05/24/23. Hemoglobin A1c previously 9.5 in 2022  Hold off home Mounjaro and other hypoglycemics CBG twice daily, avoid hypoglycemia  Diabetes  polyneuropathy Hold off gabapentin for now due to severe AKI with GFR of 9. Monitor for now  History of SVT Rate is currently controlled. BPs are soft Hold off home Bystolic to avoid hypotension. Closely monitor on telemetry  Chronic anxiety/depression Resume home Effexor.  Generalized weakness fatigue PT OT assessment Early mobilization as tolerated Fall precautions  History of CVA Resume home aspirin for secondary CVA prevention The patient is not on statin prior to admission PT OT evaluation Fall precautions   Critical care time: 65-minutes.   DVT prophylaxis: Subcu heparin 3 times daily  Code Status: Full code  Family Communication: The patient's husband at bedside.  Disposition Plan: Admitted to progressive care unit.  Consults called: PT OT  Admission status: Inpatient status.   Status is: Inpatient The patient requires at least 2 midnights for further evaluation and treatment of present condition.   Darlin Drop MD [facility] Pager [phone]  If 7PM-7AM, please contact night-coverage [email or web address] Password [email or web address]  05/30/2023, 12:14 AM

## 2023-05-31 ENCOUNTER — Encounter: Payer: Self-pay | Admitting: Internal Medicine

## 2023-05-31 DIAGNOSIS — N179 Acute kidney failure, unspecified: Secondary | ICD-10-CM | POA: Diagnosis not present

## 2023-05-31 LAB — CBC
HCT: 24.9 % — ABNORMAL LOW (ref 36.0–46.0)
Hemoglobin: 8.7 g/dL — ABNORMAL LOW (ref 12.0–15.0)
MCH: 30.1 pg (ref 26.0–34.0)
MCHC: 34.9 g/dL (ref 30.0–36.0)
MCV: 86.2 fL (ref 80.0–100.0)
Platelets: 158 10*3/uL (ref 150–400)
RBC: 2.89 MIL/uL — ABNORMAL LOW (ref 3.87–5.11)
RDW: 12.6 % (ref 11.5–15.5)
WBC: 4.6 10*3/uL (ref 4.0–10.5)
nRBC: 0 % (ref 0.0–0.2)

## 2023-05-31 LAB — URINE CULTURE
Culture: <10000 — AB
Special Requests: NORMAL

## 2023-05-31 LAB — GLUCOSE, CAPILLARY
Glucose-Capillary: 86 mg/dL (ref 70–99)
Glucose-Capillary: 126 mg/dL — ABNORMAL HIGH (ref 70–99)
Glucose-Capillary: 127 mg/dL — ABNORMAL HIGH (ref 70–99)
Glucose-Capillary: 117 mg/dL — ABNORMAL HIGH (ref 70–99)

## 2023-05-31 LAB — BLOOD GAS, VENOUS
Acid-base deficit: 7.9 mmol/L — ABNORMAL HIGH (ref 0.0–2.0)
Bicarbonate: 17.7 mmol/L — ABNORMAL LOW (ref 20.0–28.0)
Drawn by: 60076
O2 Saturation: 64 %
Patient temperature: 36.6
pCO2, Ven: 35 mm[Hg] — ABNORMAL LOW (ref 44–60)
pH, Ven: 7.31 (ref 7.25–7.43)
pO2, Ven: 34 mm[Hg] (ref 32–45)

## 2023-05-31 LAB — BASIC METABOLIC PANEL
Anion gap: 4 — ABNORMAL LOW (ref 5–15)
BUN: 29 mg/dL — ABNORMAL HIGH (ref 6–20)
CO2: 15 mmol/L — ABNORMAL LOW (ref 22–32)
Calcium: 8 mg/dL — ABNORMAL LOW (ref 8.9–10.3)
Chloride: 115 mmol/L — ABNORMAL HIGH (ref 98–111)
Creatinine, Ser: 2.06 mg/dL — ABNORMAL HIGH (ref 0.44–1.00)
GFR, Estimated: 28 mL/min — ABNORMAL LOW (ref >60)
Glucose, Bld: 92 mg/dL (ref 70–99)
Potassium: 4.2 mmol/L (ref 3.5–5.1)
Sodium: 134 mmol/L — ABNORMAL LOW (ref 135–145)

## 2023-05-31 LAB — LACTIC ACID, PLASMA: Lactic Acid, Venous: 0.8 mmol/L (ref 0.5–1.9)

## 2023-05-31 LAB — MAGNESIUM: Magnesium: 2 mg/dL (ref 1.7–2.4)

## 2023-05-31 LAB — HIV ANTIBODY (ROUTINE TESTING W REFLEX): HIV Screen 4th Generation wRfx: NONREACTIVE

## 2023-05-31 LAB — PHOSPHORUS: Phosphorus: 2 mg/dL — ABNORMAL LOW (ref 2.5–4.6)

## 2023-05-31 MED ORDER — COSYNTROPIN 0.25 MG IJ SOLR
0.2500 mg | Freq: Once | INTRAMUSCULAR | Status: AC
  Administered 2023-06-01: 0.25 mg via INTRAVENOUS
  Filled 2023-05-31: qty 0.25

## 2023-05-31 MED ORDER — SODIUM BICARBONATE 650 MG PO TABS
650.0000 mg | ORAL_TABLET | Freq: Three times a day (TID) | ORAL | Status: DC
  Administered 2023-05-31: 650 mg via ORAL
  Filled 2023-05-31: qty 1

## 2023-05-31 MED ORDER — K PHOS MONO-SOD PHOS DI & MONO 155-852-130 MG PO TABS
500.0000 mg | ORAL_TABLET | Freq: Four times a day (QID) | ORAL | Status: DC
  Administered 2023-05-31 – 2023-06-01 (×5): 500 mg via ORAL
  Filled 2023-05-31 (×5): qty 2

## 2023-05-31 MED ORDER — SODIUM BICARBONATE 8.4 % IV SOLN
INTRAVENOUS | Status: DC
  Filled 2023-05-31 (×2): qty 1000

## 2023-05-31 NOTE — Progress Notes (Signed)
OT Screen Note  Patient Details Name: Melissa [name] MRN: [id number] DOB: [birth year]   Cancelled Treatment:    Reason Eval/Treat Not Completed: OT screened, no needs identified, will sign off Patient is fully independent and does not have any concerns. OT will sign off at this time; please re-consult if further acute OT needs arise.   Pollyann Glen E. Nature Vogelsang, OTR/L Acute Rehabilitation Services [phone]   Cherlyn Cushing 05/31/2023, 8:26 AM

## 2023-05-31 NOTE — Plan of Care (Signed)
  Problem: Health Behavior/Discharge Planning: Goal: Ability to manage health-related needs will improve Outcome: Progressing   Problem: Clinical Measurements: Goal: Will remain free from infection Outcome: Progressing   Problem: Nutrition: Goal: Adequate nutrition will be maintained Outcome: Progressing

## 2023-05-31 NOTE — Plan of Care (Signed)

## 2023-05-31 NOTE — TOC CM/SW Note (Signed)
Transition of Care [facility]) - Inpatient Brief Assessment   Patient Details  Name: Melissa [name] MRN: [id number] Date of Birth: Nov 14, 1965  Transition of Care [facility] CM/SW Contact:    Mearl Latin, LCSW Phone Number: 05/31/2023, 9:25 AM   Clinical Narrative: Patient admitted from home with spouse undergoing workup for renal failure. No current TOC needs identified at this time but please place consult if needs arise.    Transition of Care Asessment: Insurance and Status: Insurance coverage has been reviewed Patient has primary care physician: Yes Home environment has been reviewed: From home Prior level of function:: Independent Prior/Current Home Services: No current home services Social Drivers of Health Review: SDOH reviewed no interventions necessary Readmission risk has been reviewed: Yes Transition of care needs: no transition of care needs at this time

## 2023-05-31 NOTE — Progress Notes (Addendum)
PROGRESS NOTE    Melissa [name]  XBM:[id number] DOB: 12/17/1965 DOA: 05/29/2023 PCP: Melissa [name]   Chief Complaint  Patient presents with   Fatigue    Brief Narrative:   Melissa [name] is a 57 y.o. female with medical history significant for type 2 diabetes on Mounjaro prescribed by her endocrinologist (Since beginning of January 2024), previous obesity (lost > 100 lbs in 1 year), diabetic polyneuropathy, SVT on Bystolic, Bell's palsy, history of CVA, history of loose stools post cholecystectomy, who initially presented to [facility] ED with complaints of 2 weeks of poor appetite, poor oral intake, generalized weakness and fatigue.  No reported subjective fevers or chills.  No reported cardiopulmonary symptoms.   In the ED, hypotensive, responded well to IV fluid.  States she does not have a history of hypertension and was placed on Bystolic for tachycardia and telmisartan for kidneys health.     Lab work was notable for severely elevated creatinine > 5.5, above normal baseline, high anion gap metabolic acidosis with serum bicarb of 16, hyponatremia 127.  The patient had a renal ultrasound done in the ED which was unremarkable.  She received 1 L IV fluid bolus in the ED as well as IV Zofran.  TRH, hospitalist service, was asked to admit.   ED Course: Temperature 98.1.  BP 89/56, pulse 80, respiratory 18, O2 saturation 100% on room air.  Assessment & Plan:   Principal Problem:   ARF (acute renal failure) (HCC)    AKI -Prerenal, volume depletion and dehydration, with hypotension, in the setting of poor oral intake while using Mounjaro, and she is on antihypertensive medication including telmisartan and Bystolic. -renal ultrasound with no acute findings -urine sodium 22 -Continue with IV fluids, function much improved, peaked at 5.5, creatinine this morning at 2.09, continue with IV fluids and avoid nephrotoxic medication   High anion gap metabolic acidosis Spite aggressive  IV hydration, and oral bicarb replacement rate remains low, so we will start on bicarb drip, continue with IV fluids Lactic acid within normal limit VBG showing metabolic acidosis with compensatory respiratory alkalosis  Hypertension -Pressure remains soft despite aggressive IV hydration, her a.m. cortisol is 8, even though it is not within the normal range, on the lower side which I would not expect someone in her presentation with hypotension, as well given she is having metabolic acidosis I will obtain consent troponin test to rule out abdomen sufficiency especially with her metabolic  acidosis.  History of type 2 diabetes, now controlled Hemoglobin A1c 5.2 on 05/24/23. Hemoglobin A1c previously 9.5 in 2022  Hold off home Mounjaro and other hypoglycemics Monitor CBG without sliding scale coverage   Diabetes polyneuropathy Hold off gabapentin for now due to severe AKI with GFR of 9. Monitor for now   History of SVT Rate is currently controlled. BPs are soft Hold off home Bystolic to avoid hypotension. Closely monitor on telemetry   Chronic anxiety/depression Resume home Effexor.   Generalized weakness fatigue PT OT assessment Early mobilization as tolerated Fall precautions   History of CVA Resume home aspirin for secondary CVA prevention The patient is not on statin prior to admission PT OT evaluation Fall precautions  Hyponatremia Hypokalemia Hypomagnesemia -Continue to replace aggressively and monitor closely, continue with IV fluids     DVT prophylaxis: Cisne heparin Code Status: Full code Family Communication: Discussed with daughter at bedside Disposition:   Status is: Inpatient    Consultants:  None  Subjective: She reports generalized weakness  and fatigue, appetite has improved.  Objective: Vitals:   05/31/23 0457 05/31/23 0600 05/31/23 0800 05/31/23 1119  BP:  (!) 84/60    Pulse: 81 77    Resp: 18 15    Temp: 97.8 F (36.6 C)  97.7 F (36.5 C)  97.7 F (36.5 C)  TempSrc: Oral  Oral Oral  SpO2: 97% 95%    Weight:      Height:        Intake/Output Summary (Last 24 hours) at 05/31/2023 1444 Last data filed at 05/31/2023 0600 Gross per 24 hour  Intake 1688.6 ml  Output --  Net 1688.6 ml   Filed Weights   05/29/23 1829 05/31/23 0400  Weight: 61.2 kg 66.2 kg    Examination:  Awake Alert, Oriented X 3, No new F.N deficits, Normal affect Symmetrical Chest wall movement, Good air movement bilaterally, CTAB RRR,No Gallops,Rubs or new Murmurs, No Parasternal Heave +ve B.Sounds, Abd Soft, No tenderness, No rebound - guarding or rigidity. No Cyanosis, Clubbing or edema, No new Rash or bruise     Data Reviewed: I have personally reviewed following labs and imaging studies  CBC: Recent Labs  Lab 05/29/23 1833 05/30/23 0550 05/31/23 0458  WBC 7.5 6.0 4.6  HGB 10.9* 9.3* 8.7*  HCT 31.1* 26.3* 24.9*  MCV 85.9 85.1 86.2  PLT 219 175 158    Basic Metabolic Panel: Recent Labs  Lab 05/29/23 1833 05/30/23 0550 05/30/23 1739 05/31/23 0458  NA 127* 127* 132* 134*  K 3.5 2.7* 4.7 4.2  CL 95* 103 108 115*  CO2 16* 16* 16* 15*  GLUCOSE 94 89 110* 92  BUN 55* 49* 37* 29*  CREATININE 5.51* 4.47* 2.90* 2.06*  CALCIUM 9.0 7.9* 8.4* 8.0*  MG 1.6* 1.6* 2.4 2.0  PHOS  --  4.3  --  2.0*    GFR: Estimated Creatinine Clearance: 26.2 mL/min (A) (by C-G formula based on SCr of 2.06 mg/dL (H)).  Liver Function Tests: Recent Labs  Lab 05/29/23 1833 05/30/23 0550  AST 17  --   ALT 17  --   ALKPHOS 57  --   BILITOT 0.5  --   PROT 7.2  --   ALBUMIN 4.3 3.2*    CBG: Recent Labs  Lab 05/30/23 0146 05/30/23 2117 05/31/23 0808 05/31/23 1125  GLUCAP 76 82 86 126*     Recent Results (from the past 240 hours)  Urine Culture (for pregnant, neutropenic or urologic patients or patients with an indwelling urinary catheter)     Status: Abnormal   Collection Time: 05/30/23 12:51 AM   Specimen: Urine, Clean Catch  Result  Value Ref Range Status   Specimen Description URINE, CLEAN CATCH  Final   Special Requests Normal  Final   Culture (A)  Final    <10,000 COLONIES/mL INSIGNIFICANT GROWTH Performed at [facility], [address], [place], [place] [address]    Report Status 05/31/2023 FINAL  Final         Radiology Studies: US Renal Result Date: 05/29/2023 CLINICAL DATA:  AKI EXAM: RENAL / URINARY TRACT ULTRASOUND COMPLETE COMPARISON:  None Available. FINDINGS: The right kidney measured 10.4 cm and the left kidney measured 10.2 cm. The kidneys demonstrate normal echogenicity. No renal parenchymal lesions are identified. No shadowing stones are seen. No hydronephrosis. Bladder: Appears normal for degree of bladder distention. IMPRESSION: Unremarkable examination of the kidneys and bladder. Electronically Signed   By: Layla Maw M.D.   On: 05/29/2023 20:17  Scheduled Meds:  aspirin  81 mg Oral Daily   [START ON 06/01/2023] cosyntropin  0.25 mg Intravenous Once   heparin  5,000 Units Subcutaneous Q8H   phosphorus  500 mg Oral QID   venlafaxine XR  37.5 mg Oral Daily   Continuous Infusions:  cefTRIAXone (ROCEPHIN)  IV 1 g (05/31/23 0211)   sodium bicarbonate 150 mEq in dextrose 5 % 1,150 mL infusion     thiamine (VITAMIN B1) injection 500 mg (05/30/23 2336)     LOS: 2 days       Huey Bienenstock, [name]   To contact the attending provider between 7A-7P or the covering provider during after hours 7P-7A, please log into the web site [email or web address] and access using universal [place] password for that web site. If you do not have the password, please call the hospital operator.  05/31/2023, 2:44 PM

## 2023-06-01 DIAGNOSIS — N179 Acute kidney failure, unspecified: Secondary | ICD-10-CM | POA: Diagnosis not present

## 2023-06-01 LAB — ACTH STIMULATION, 3 TIME POINTS
Cortisol, 30 Min: 17.6 ug/dL
Cortisol, 60 Min: 18.5 ug/dL
Cortisol, Base: 10.2 ug/dL

## 2023-06-01 LAB — BASIC METABOLIC PANEL
Anion gap: 8 (ref 5–15)
BUN: 15 mg/dL (ref 6–20)
CO2: 22 mmol/L (ref 22–32)
Calcium: 8.1 mg/dL — ABNORMAL LOW (ref 8.9–10.3)
Chloride: 111 mmol/L (ref 98–111)
Creatinine, Ser: 1.49 mg/dL — ABNORMAL HIGH (ref 0.44–1.00)
GFR, Estimated: 41 mL/min — ABNORMAL LOW (ref >60)
Glucose, Bld: 103 mg/dL — ABNORMAL HIGH (ref 70–99)
Potassium: 4 mmol/L (ref 3.5–5.1)
Sodium: 141 mmol/L (ref 135–145)

## 2023-06-01 LAB — CBC
HCT: 25.9 % — ABNORMAL LOW (ref 36.0–46.0)
Hemoglobin: 9.1 g/dL — ABNORMAL LOW (ref 12.0–15.0)
MCH: 30.2 pg (ref 26.0–34.0)
MCHC: 35.1 g/dL (ref 30.0–36.0)
MCV: 86 fL (ref 80.0–100.0)
Platelets: 163 10*3/uL (ref 150–400)
RBC: 3.01 MIL/uL — ABNORMAL LOW (ref 3.87–5.11)
RDW: 13 % (ref 11.5–15.5)
WBC: 4.8 10*3/uL (ref 4.0–10.5)
nRBC: 0 % (ref 0.0–0.2)

## 2023-06-01 LAB — PHOSPHORUS: Phosphorus: 2.8 mg/dL (ref 2.5–4.6)

## 2023-06-01 LAB — MAGNESIUM: Magnesium: 1.3 mg/dL — ABNORMAL LOW (ref 1.7–2.4)

## 2023-06-01 LAB — GLUCOSE, CAPILLARY: Glucose-Capillary: 112 mg/dL — ABNORMAL HIGH (ref 70–99)

## 2023-06-01 MED ORDER — ASPIRIN 81 MG PO CHEW: 81.0000 mg | CHEWABLE_TABLET | Freq: Every day | ORAL | Status: AC

## 2023-06-01 MED ORDER — THIAMINE HCL 100 MG PO TABS: 100.0000 mg | ORAL_TABLET | Freq: Every day | ORAL | Status: AC

## 2023-06-01 MED ORDER — MAGNESIUM SULFATE 4 GM/100ML IV SOLN
4.0000 g | Freq: Once | INTRAVENOUS | Status: AC
  Administered 2023-06-01: 4 g via INTRAVENOUS
  Filled 2023-06-01: qty 100

## 2023-06-01 MED ORDER — CYANOCOBALAMIN 500 MCG PO TABS: 500.0000 ug | ORAL_TABLET | Freq: Every day | ORAL | Status: AC

## 2023-06-01 MED ORDER — VENLAFAXINE HCL ER 37.5 MG PO CP24: 37.5000 mg | ORAL_CAPSULE | Freq: Every day | ORAL | Status: AC

## 2023-06-01 NOTE — TOC Transition Note (Signed)
Transition of Care [facility]) - Discharge Note   Patient Details  Name: Melissa [name] MRN: [id number] Date of Birth: [birth year]  [facility] CM/SW Contact:  Gordy Clement, RN Phone Number: 06/01/2023, 11:05 AM   Clinical Narrative:     Patient to DC to home today  No TOC needs identified  Family to transport           Patient Goals and CMS Choice            Discharge Placement                       Discharge Plan and Services Additional resources added to the After Visit Summary for                                       Social Drivers of Health (SDOH) Interventions SDOH Screenings   Food Insecurity: No Food Insecurity (05/30/2023)  Housing: Low Risk  (05/30/2023)  Transportation Needs: No Transportation Needs (05/30/2023)  Utilities: Not At Risk (05/30/2023)  Tobacco Use: Low Risk  (05/24/2023)     Readmission Risk Interventions     No data to display

## 2023-06-01 NOTE — Plan of Care (Signed)
  Problem: Education: Goal: Knowledge of General Education information will improve Description: Including pain rating scale, medication(s)/side effects and non-pharmacologic comfort measures Outcome: Progressing   Problem: Health Behavior/Discharge Planning: Goal: Ability to manage health-related needs will improve Outcome: Progressing   Problem: Clinical Measurements: Goal: Ability to maintain clinical measurements within normal limits will improve Outcome: Progressing Goal: Will remain free from infection Outcome: Progressing   Problem: Elimination: Goal: Will not experience complications related to bowel motility Outcome: Progressing Goal: Will not experience complications related to urinary retention Outcome: Progressing

## 2023-06-01 NOTE — Discharge Summary (Signed)
Physician Discharge Summary  Melissa [name] VZD:[id number] DOB: September 13, 1965 DOA: 05/29/2023  PCP: Bobbye Morton, MD  Admit date: 05/29/2023 Discharge date: 06/01/2023  Admitted From: (Home) Disposition:  (Home )  Recommendations for Outpatient Follow-up:  Follow up with PCP in 1-2 weeks Please obtain BMP/CBC in one week Medications, blood pressure medication all has been held given stable blood pressure and blood glucose during hospital stay, they can be reassessed and readdressed as an outpatient if needed  Diet recommendation: Heart Healthy / Carb Modified  Brief/Interim Summary: Melissa [name] is a 57 y.o. female with medical history significant for type 2 diabetes on Mounjaro prescribed by her endocrinologist (Since beginning of January 2024), previous obesity (lost > 100 lbs in 1 year), diabetic polyneuropathy, SVT on Bystolic, Bell's palsy, history of CVA, history of loose stools post cholecystectomy, who initially presented to [facility] ED with complaints of 2 weeks of poor appetite, poor oral intake, generalized weakness and fatigue.  No reported subjective fevers or chills.  No reported cardiopulmonary symptoms.   In the ED, hypotensive, responded well to IV fluid.  States she does not have a history of hypertension and was placed on Bystolic for tachycardia and telmisartan for kidneys health.     Lab work was notable for severely elevated creatinine > 5.5, above normal baseline, high anion gap metabolic acidosis with serum bicarb of 16, hyponatremia 127.  The patient had a renal ultrasound done in the ED which was unremarkable.  She received 1 L IV fluid bolus in the ED as well as IV Zofran.  TRH, hospitalist service, was asked to admit.   ED Course: Temperature 98.1.  BP 89/56, pulse 80, respiratory 18, O2 saturation 100% on room air.    AKI -Prerenal, volume depletion and dehydration, with hypotension, in the setting of poor oral intake while using Mounjaro, and she is on  antihypertensive medication including telmisartan and Bystolic. -renal ultrasound with no acute findings -urine sodium 22 -She was treated with IV fluids, required aggressive IV fluid hydration, her creatinine peaked at 5.5, her baseline creatinine is around 1, this has much improved at time of discharge, creatinine is 1.49 at time of discharge  -Her blood pressure medications has been discontinued at time of discharge given her blood pressure has been acceptable to soft during hospital stay.   High anion gap metabolic acidosis Despite aggressive IV hydration, and oral bicarb supplement she remained with significant metabolic acidosis, so she was started on bicarb drip, where her bicarb has normalized at 22 at time of discharge. as well as started on oral supplement pite aggressive VBG showing metabolic acidosis with compensatory respiratory alkalosis   Hypotension -Pressure remains soft despite aggressive IV hydration, her a.m. cortisol is 8, even though it is not within the normal range, on the lower side which I would not expect someone in her presentation with hypotension, as well given she is having metabolic acidosis, consent troponin stimulation test has been obtained, was within normal limit. -Hypotension has resolved at time of discharge -Home medication Bystolic and lisinopril has been discontinued, can be resumed as an outpatient if felt needed and blood pressure started to increase.   History of type 2 diabetes, now controlled Hemoglobin A1c 5.2 on 05/24/23. Hemoglobin A1c previously 9.5 in 2022  Hold off home Mounjaro and metformin at time of discharge, she was directed to follow with her endocrinologist as an outpatient to see if it is needed to resume those meds.    Diabetes polyneuropathy [place]  held initially given AKI, it was resumed on discharge  History of SVT Rate is currently controlled. BPs are soft Hold off home Bystolic to avoid hypotension, patient with no evidence  of SVTs during hospital stay.   Chronic anxiety/depression Resume home Effexor.   Generalized weakness fatigue This has resolved, evaluated in the hallway today prior to discharge with no complaints   History of CVA Resume home aspirin for secondary CVA prevention The patient is not on statin prior to admission   Hyponatremia Hypokalemia Hypomagnesemia -Has been monitored closely and repleted  Discharge Diagnoses:  Principal Problem:   ARF (acute renal failure) (HCC) Active Problems:   GERD (gastroesophageal reflux disease)   Dyslipidemia   Bell's palsy   History of CVA (cerebrovascular accident)    Discharge Instructions  Discharge Instructions     Diet - low sodium heart healthy   Complete by: As directed    Discharge instructions   Complete by: As directed    Follow with Primary MD Street, Stephanie Coup, MD in 7 days   Get CBC, CMP,checked  by Primary MD next visit.    Activity: As tolerated with Full fall precautions use walker/cane & assistance as needed   Disposition Home    Diet: Heart Healthy ,carb modified   On your next visit with your primary care physician please Get Medicines reviewed and adjusted.   Please request your Prim.MD to go over all Hospital Tests and Procedure/Radiological results at the follow up, please get all Hospital records sent to your Prim MD by signing hospital release before you go home.   If you experience worsening of your admission symptoms, develop shortness of breath, life threatening emergency, suicidal or homicidal thoughts you must seek medical attention immediately by calling 911 or calling your MD immediately  if symptoms less severe.  You Must read complete instructions/literature along with all the possible adverse reactions/side effects for all the Medicines you take and that have been prescribed to you. Take any new Medicines after you have completely understood and accpet all the possible adverse reactions/side  effects.   Do not drive, operating heavy machinery, perform activities at heights, swimming or participation in water activities or provide baby sitting services if your were admitted for syncope or siezures until you have seen by Primary MD or a Neurologist and advised to do so again.  Do not drive when taking Pain medications.    Do not take more than prescribed Pain, Sleep and Anxiety Medications  Special Instructions: If you have smoked or chewed Tobacco  in the last 2 yrs please stop smoking, stop any regular Alcohol  and or any Recreational drug use.  Wear Seat belts while driving.   Please note  You were cared for by a hospitalist during your hospital stay. If you have any questions about your discharge medications or the care you received while you were in the hospital after you are discharged, you can call the unit and asked to speak with the hospitalist on call if the hospitalist that took care of you is not available. Once you are discharged, your primary care physician will handle any further medical issues. Please note that NO REFILLS for any discharge medications will be authorized once you are discharged, as it is imperative that you return to your primary care physician (or establish a relationship with a primary care physician if you do not have one) for your aftercare needs so that they can reassess your need for medications and monitor your  lab values.   Increase activity slowly   Complete by: As directed       Allergies as of 06/01/2023       Reactions   Amoxicillin Anaphylaxis, Other (See Comments)   Has patient had a PCN reaction causing immediate rash, facial/tongue/throat swelling, SOB or lightheadedness with hypotension: Yes Has patient had a PCN reaction causing severe rash involving mucus membranes or skin necrosis: No Has patient had a PCN reaction that required hospitalization No Has patient had a PCN reaction occurring within the last 10 years: No If all of  the above answers are "NO", then may proceed with Cephalosporin use. Had Ceftriaxone IM in 2021   Ciprofloxacin Anaphylaxis   Other reaction(s): anaphylaxis   Other Anaphylaxis   Pt states that she can only take Avelox, and Zithromax.     Penicillins Anaphylaxis, Other (See Comments)   Has patient had a PCN reaction causing immediate rash, facial/tongue/throat swelling, SOB or lightheadedness with hypotension: Yes Has patient had a PCN reaction causing severe rash involving mucus membranes or skin necrosis: No Has patient had a PCN reaction that required hospitalization No Has patient had a PCN reaction occurring within the last 10 years: No If all of the above answers are "NO", then may proceed with Cephalosporin use.   Sulfa Antibiotics Anaphylaxis, Other (See Comments)   Pt states she can take sulfa abx at this time   Erythromycin Other (See Comments)   GI upset with oral regimen over 10 years ago Other reaction(s): anaphylaxis   Vicodin [hydrocodone-acetaminophen] Itching        Medication List     STOP taking these medications    meloxicam 7.5 MG tablet Commonly known as: MOBIC   metFORMIN 500 MG 24 hr tablet Commonly known as: GLUCOPHAGE-XR   nebivolol 10 MG tablet Commonly known as: Bystolic   telmisartan 40 MG tablet Commonly known as: Micardis   tirzepatide 5 MG/0.5ML Pen Commonly known as: MOUNJARO   Toujeo Max SoloStar 300 UNIT/ML Solostar Pen Generic drug: insulin glargine (2 Unit Dial)       TAKE these medications    albuterol 108 (90 Base) MCG/ACT inhaler Commonly known as: VENTOLIN HFA Inhale 2 puffs into the lungs every 4 (four) hours as needed for wheezing or shortness of breath.   aspirin 81 MG chewable tablet Chew 1 tablet (81 mg total) by mouth daily.   blood glucose meter kit and supplies Kit Dispense based on patient and insurance preference. Use 1x a day. (FOR ICD-9 250.00, 250.01).   cyanocobalamin 500 MCG tablet Commonly known as:  VITAMIN B12 Take 1 tablet (500 mcg total) by mouth daily.   glucose blood test strip Commonly known as: FREESTYLE LITE Use as instructed, test blood sugar once a day   Insulin Pen Needle 32G X 4 MM Misc Use 1x a day   Lancets Misc Use 1x a day   Melatonin 10 MG Tabs Take 1 tablet by mouth at bedtime.   thiamine 100 MG tablet Commonly known as: VITAMIN B1 Take 1 tablet (100 mg total) by mouth daily.   venlafaxine XR 37.5 MG 24 hr capsule Commonly known as: EFFEXOR-XR Take 1 capsule (37.5 mg total) by mouth daily.        Allergies  Allergen Reactions   Amoxicillin Anaphylaxis and Other (See Comments)    Has patient had a PCN reaction causing immediate rash, facial/tongue/throat swelling, SOB or lightheadedness with hypotension: Yes Has patient had a PCN reaction causing severe rash involving  mucus membranes or skin necrosis: No Has patient had a PCN reaction that required hospitalization No Has patient had a PCN reaction occurring within the last 10 years: No If all of the above answers are "NO", then may proceed with Cephalosporin use.  Had Ceftriaxone IM in 2021   Ciprofloxacin Anaphylaxis    Other reaction(s): anaphylaxis   Other Anaphylaxis    Pt states that she can only take Avelox, and Zithromax.     Penicillins Anaphylaxis and Other (See Comments)    Has patient had a PCN reaction causing immediate rash, facial/tongue/throat swelling, SOB or lightheadedness with hypotension: Yes Has patient had a PCN reaction causing severe rash involving mucus membranes or skin necrosis: No Has patient had a PCN reaction that required hospitalization No Has patient had a PCN reaction occurring within the last 10 years: No If all of the above answers are "NO", then may proceed with Cephalosporin use.   Sulfa Antibiotics Anaphylaxis and Other (See Comments)    Pt states she can take sulfa abx at this time   Erythromycin Other (See Comments)    GI upset with oral regimen over 10  years ago Other reaction(s): anaphylaxis   Vicodin [Hydrocodone-Acetaminophen] Itching    Consultations: none   Procedures/Studies: US Renal Result Date: 05/29/2023 CLINICAL DATA:  AKI EXAM: RENAL / URINARY TRACT ULTRASOUND COMPLETE COMPARISON:  None Available. FINDINGS: The right kidney measured 10.4 cm and the left kidney measured 10.2 cm. The kidneys demonstrate normal echogenicity. No renal parenchymal lesions are identified. No shadowing stones are seen. No hydronephrosis. Bladder: Appears normal for degree of bladder distention. IMPRESSION: Unremarkable examination of the kidneys and bladder. Electronically Signed   By: Layla Maw M.D.   On: 05/29/2023 20:17      Subjective:  Reports good appetite, no dizziness, no weakness, she is eager to go home today Discharge Exam: Vitals:   06/01/23 0450 06/01/23 0800  BP: 107/69 (!) 129/90  Pulse: 89 86  Resp: 16 16  Temp: 97.9 F (36.6 C) 98.5 F (36.9 C)  SpO2: 97% 100%   Vitals:   05/31/23 2352 06/01/23 0450 06/01/23 0500 06/01/23 0800  BP: 99/70 107/69  (!) 129/90  Pulse: 83 89  86  Resp: 16 16  16   Temp: 98.2 F (36.8 C) 97.9 F (36.6 C)  98.5 F (36.9 C)  TempSrc: Oral Oral  Oral  SpO2: 98% 97%  100%  Weight:   66.5 kg   Height:        General: Pt is alert, awake, not in acute distress Cardiovascular: RRR, S1/S2 +, no rubs, no gallops Respiratory: CTA bilaterally, no wheezing, no rhonchi Abdominal: Soft, NT, ND, bowel sounds + Extremities: no edema, no cyanosis    The results of significant diagnostics from this hospitalization (including imaging, microbiology, ancillary and laboratory) are listed below for reference.     Microbiology: Recent Results (from the past 240 hours)  Urine Culture (for pregnant, neutropenic or urologic patients or patients with an indwelling urinary catheter)     Status: Abnormal   Collection Time: 05/30/23 12:51 AM   Specimen: Urine, Clean Catch  Result Value Ref Range  Status   Specimen Description URINE, CLEAN CATCH  Final   Special Requests Normal  Final   Culture (A)  Final    <10,000 COLONIES/mL INSIGNIFICANT GROWTH Performed at [facility], [address], [place], [place] [address]    Report Status 05/31/2023 FINAL  Final     Labs: BNP (last  3 results) No results for input(s): "BNP" in the last 8760 hours. Basic Metabolic Panel: Recent Labs  Lab 05/29/23 1833 05/30/23 0550 05/30/23 1739 05/31/23 0458 06/01/23 0529  NA 127* 127* 132* 134* 141  K 3.5 2.7* 4.7 4.2 4.0  CL 95* 103 108 115* 111  CO2 16* 16* 16* 15* 22  GLUCOSE 94 89 110* 92 103*  BUN 55* 49* 37* 29* 15  CREATININE 5.51* 4.47* 2.90* 2.06* 1.49*  CALCIUM 9.0 7.9* 8.4* 8.0* 8.1*  MG 1.6* 1.6* 2.4 2.0 1.3*  PHOS  --  4.3  --  2.0* 2.8   Liver Function Tests: Recent Labs  Lab 05/29/23 1833 05/30/23 0550  AST 17  --   ALT 17  --   ALKPHOS 57  --   BILITOT 0.5  --   PROT 7.2  --   ALBUMIN 4.3 3.2*   Recent Labs  Lab 05/29/23 1833  LIPASE 152*   No results for input(s): "AMMONIA" in the last 168 hours. CBC: Recent Labs  Lab 05/29/23 1833 05/30/23 0550 05/31/23 0458 06/01/23 0529  WBC 7.5 6.0 4.6 4.8  HGB 10.9* 9.3* 8.7* 9.1*  HCT 31.1* 26.3* 24.9* 25.9*  MCV 85.9 85.1 86.2 86.0  PLT 219 175 158 163   Cardiac Enzymes: No results for input(s): "CKTOTAL", "CKMB", "CKMBINDEX", "TROPONINI" in the last 168 hours. BNP: Invalid input(s): "POCBNP" CBG: Recent Labs  Lab 05/31/23 0808 05/31/23 1125 05/31/23 1908 05/31/23 2123 06/01/23 0802  GLUCAP 86 126* 127* 117* 112*   D-Dimer No results for input(s): "DDIMER" in the last 72 hours. Hgb A1c No results for input(s): "HGBA1C" in the last 72 hours. Lipid Profile No results for input(s): "CHOL", "HDL", "LDLCALC", "TRIG", "CHOLHDL", "LDLDIRECT" in the last 72 hours. Thyroid function studies No results for input(s): "TSH", "T4TOTAL", "T3FREE", "THYROIDAB" in the last 72 hours.  Invalid  input(s): "FREET3" Anemia work up Recent Labs    05/30/23 0550  VITAMINB12 374   Urinalysis    Component Value Date/Time   COLORURINE YELLOW 05/29/2023 1832   APPEARANCEUR HAZY (A) 05/29/2023 1832   LABSPEC 1.011 05/29/2023 1832   PHURINE 5.0 05/29/2023 1832   GLUCOSEU NEGATIVE 05/29/2023 1832   HGBUR TRACE (A) 05/29/2023 1832   BILIRUBINUR NEGATIVE 05/29/2023 1832   KETONESUR NEGATIVE 05/29/2023 1832   PROTEINUR 30 (A) 05/29/2023 1832   UROBILINOGEN 0.2 06/15/2010 1254   NITRITE NEGATIVE 05/29/2023 1832   LEUKOCYTESUR NEGATIVE 05/29/2023 1832   Sepsis Labs Recent Labs  Lab 05/29/23 1833 05/30/23 0550 05/31/23 0458 06/01/23 0529  WBC 7.5 6.0 4.6 4.8   Microbiology Recent Results (from the past 240 hours)  Urine Culture (for pregnant, neutropenic or urologic patients or patients with an indwelling urinary catheter)     Status: Abnormal   Collection Time: 05/30/23 12:51 AM   Specimen: Urine, Clean Catch  Result Value Ref Range Status   Specimen Description URINE, CLEAN CATCH  Final   Special Requests Normal  Final   Culture (A)  Final    <10,000 COLONIES/mL INSIGNIFICANT GROWTH Performed at [facility], [address], [place], [place] [address]    Report Status 05/31/2023 FINAL  Final     Time coordinating discharge: Over 30 minutes  SIGNED:   Huey Bienenstock, MD  [facility] 06/01/2023, 4:14 PM Pager   If 7PM-7AM, please contact night-coverage [email or web address] Password [email or web address]

## 2023-06-01 NOTE — Discharge Instructions (Signed)
Follow with Primary MD Street, Stephanie Coup, MD in 7 days   Get CBC, CMP,checked  by Primary MD next visit.    Activity: As tolerated with Full fall precautions use walker/cane & assistance as needed   Disposition Home    Diet: Heart Healthy ,carb modified   On your next visit with your primary care physician please Get Medicines reviewed and adjusted.   Please request your Prim.MD to go over all Hospital Tests and Procedure/Radiological results at the follow up, please get all Hospital records sent to your Prim MD by signing hospital release before you go home.   If you experience worsening of your admission symptoms, develop shortness of breath, life threatening emergency, suicidal or homicidal thoughts you must seek medical attention immediately by calling 911 or calling your MD immediately  if symptoms less severe.  You Must read complete instructions/literature along with all the possible adverse reactions/side effects for all the Medicines you take and that have been prescribed to you. Take any new Medicines after you have completely understood and accpet all the possible adverse reactions/side effects.   Do not drive, operating heavy machinery, perform activities at heights, swimming or participation in water activities or provide baby sitting services if your were admitted for syncope or siezures until you have seen by Primary MD or a Neurologist and advised to do so again.  Do not drive when taking Pain medications.    Do not take more than prescribed Pain, Sleep and Anxiety Medications  Special Instructions: If you have smoked or chewed Tobacco  in the last 2 yrs please stop smoking, stop any regular Alcohol  and or any Recreational drug use.  Wear Seat belts while driving.   Please note  You were cared for by a hospitalist during your hospital stay. If you have any questions about your discharge medications or the care you received while you were in the hospital  after you are discharged, you can call the unit and asked to speak with the hospitalist on call if the hospitalist that took care of you is not available. Once you are discharged, your primary care physician will handle any further medical issues. Please note that NO REFILLS for any discharge medications will be authorized once you are discharged, as it is imperative that you return to your primary care physician (or establish a relationship with a primary care physician if you do not have one) for your aftercare needs so that they can reassess your need for medications and monitor your lab values.

## 2023-06-04 LAB — VITAMIN B1: Vitamin B1 (Thiamine): 99.6 nmol/L (ref 66.5–200.0)

## 2023-09-27 ENCOUNTER — Ambulatory Visit: Payer: TRICARE For Life (TFL) | Admitting: Internal Medicine

## 2023-09-27 ENCOUNTER — Encounter: Payer: Self-pay | Admitting: Internal Medicine

## 2023-09-27 VITALS — BP 120/70 | HR 98 | Ht 60.0 in | Wt 173.6 lb

## 2023-09-27 DIAGNOSIS — E66811 Obesity, class 1: Secondary | ICD-10-CM

## 2023-09-27 DIAGNOSIS — Z7984 Long term (current) use of oral hypoglycemic drugs: Secondary | ICD-10-CM | POA: Diagnosis not present

## 2023-09-27 DIAGNOSIS — E785 Hyperlipidemia, unspecified: Secondary | ICD-10-CM

## 2023-09-27 DIAGNOSIS — E119 Type 2 diabetes mellitus without complications: Secondary | ICD-10-CM | POA: Diagnosis not present

## 2023-09-27 LAB — POCT GLYCOSYLATED HEMOGLOBIN (HGB A1C): Hemoglobin A1C: 6.2 % — AB (ref 4.0–5.6)

## 2023-09-27 MED ORDER — DEXCOM G7 SENSOR MISC: 3.0000 | Status: DC

## 2023-09-27 NOTE — Patient Instructions (Addendum)
 Please continue off Metformin and Mounjaro.  Restart the CGM.  Please come back for a follow-up appointment in 4 months.

## 2023-09-27 NOTE — Progress Notes (Signed)
 Patient ID: Melissa [name], female   DOB: Feb 22, 1966, 58 y.o.   MRN: [id number]   HPI: Melissa [name] is a 58 y.o.-year-old female, returning for f/u for DM2, dx in 1997, insulin-dependent until recently, controlled, without long-term complications. Last OV 4 months ago.  Interim history: No increased urination, blurry vision, chest pain.   She has significant weight loss but mentioned  that this was partly due to dysgeusia not decreased appetite necessarily - everything was tasting bitter even since her Bell's palsy 02/2022.  However, she was admitted with AKI in the creatinine above 5 along with hyponatremia and a high lipase on 05/29/2023.  A cosyntropin stimulation test was normal at that time.  She was taken off metformin and Mounjaro.  She continues off these.  Since then, she started eating and drinking better.  She gained approximately 30 pounds back.  She feels much better. 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  She had many labs performed recently, she is expecting the results and will send them to me.  She started Chromium, berberine. Plans to start ALA at [facility].  Reviewed HbA1c levels: Lab Results  Component Value Date   HGBA1C 5.2 05/24/2023   HGBA1C 5.1 01/04/2023   HGBA1C 5.0 04/23/2022  08/24/2022: HbA1c 5.3% 1997: HbA1c 11%  She is on: - Metformin ER  1000 mg 2x a day with meals >> OFF - Trulicity 1.5 >> 3 >> 4.5 mg weekly >> Mounjaro 5 >> 10 >> 5 mg weekly >> OFF - >> off - Previously on JanuMet. She had nausea with metformin.  Pt was checking sugars 4x a day with the Dexcom G7:  Previously:   Lowest sugar was 53 (but with glucometer: 80s)  >> 61 >> 75 >> 90s; she has hypoglycemia awareness in the 70s. Highest sugar was 380 (steroids) >> 200 >> 190 >> 200  Glucometer: RelioN >> Freestyle lite  -No CKD; last BUN/creatinine:  Lab Results  Component Value Date   BUN 15 06/01/2023   BUN 29 (H) 05/31/2023   CREATININE 1.49 (H)  06/01/2023   CREATININE 2.06 (H) 05/31/2023   Lab Results  Component Value Date   MICRALBCREAT 9 05/24/2023   MICRALBCREAT 1.3 09/15/2021  On telmisartan 40.  -+ HL; last set of lipids: Lab Results  Component Value Date   CHOL 145 05/24/2023   HDL 48 (L) 05/24/2023   LDLCALC 71 05/24/2023   LDLDIRECT 125.0 06/08/2019   TRIG 183 (H) 05/24/2023   CHOLHDL 3.0 05/24/2023  She could not tolerate statins and Zetia in the past.  She has fatty liver and a history of transaminitis, But latest LFTs were normal: Lab Results  Component Value Date   ALT 17 05/29/2023   AST 17 05/29/2023   ALKPHOS 57 05/29/2023   BILITOT 0.5 05/29/2023   - last eye exam was in 2025: No DR. + cataract.  - no numbness and tingling in her feet.  Last foot exam 05/24/2023.  Pt is adopted - ? FH of DM.  08/24/2022: TSH 1.277  She had COVID-19 in 06/2020.  She had prednisone at that time and sugars increased to the 300s. She lost 55 lbs in 2019 ([employer]), then gained back 40 pounds. She mentions an allergy to almost all antibiotics, but she can take Levaquin, Z-Pack, Avelox, doxycycline. She was in the emergency room with rectal bleeding 01/29/2021.  She had a CT scan of the abdomen which showed diverticulosis, but without diverticulitis.  She had  abdominal skin removal 09/2022.  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  ROS: + See HPI  I reviewed pt's medications, allergies, PMH, social hx, family hx, and changes were documented in the history of present illness. Otherwise, unchanged from my initial visit note.  Past Medical History:  Diagnosis Date   Asthma    Bell's palsy    Complication of anesthesia    Diabetes mellitus without complication (HCC)    Type II   Dysrhythmia    Tachycardia   GERD (gastroesophageal reflux disease)    Pneumonia    2014- '[facility]alking"   Shortness of breath dyspnea    with exertion   Stroke [facility])    Vertigo    Past Surgical History:  Procedure Laterality Date    CHOLECYSTECTOMY  1997   COLONOSCOPY  2015   CYSTOSCOPY     age 58- stretched stem of bladder   EYE SURGERY     Baby- Strabismus   SHOULDER ARTHROSCOPY Left 11/20/2014   Procedure: ARTHROSCOPY SHOULDER WITH MUA, ROTATOR INTERVAL RELEASE;  Surgeon: Jasmine Mesi, MD;  Location: [facility] OR;  Service: Orthopedics;  Laterality: Left;  LEFT SHOULDER MUA, DOA, ROTATOR INTERVAL RELEASE.   TONSILLECTOMY  2010   Social History   Socioeconomic History   Marital status: Married    Spouse name: Not on file   Number of children: 1   Years of education: Not on file   Highest education level: Not on file  Occupational History     Tobacco Use   Smoking status: Never Smoker   Smokeless tobacco: Never Used  Substance and Sexual Activity   Alcohol use: Yes    Alcohol/week: 0.0 oz    Comment: rare   Drug use: No      Social History Narrative   Married.   1 daughter.   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    Current Outpatient Medications on File Prior to Visit  Medication Sig Dispense Refill   albuterol (VENTOLIN HFA) 108 (90 Base) MCG/ACT inhaler Inhale 2 puffs into the lungs every 4 (four) hours as needed for wheezing or shortness of breath. 18 g 0   aspirin 81 MG chewable tablet Chew 1 tablet (81 mg total) by mouth daily.     blood glucose meter kit and supplies KIT Dispense based on patient and insurance preference. Use 1x a day. (FOR ICD-9 250.00, 250.01). 1 each 0   cyanocobalamin (VITAMIN B12) 500 MCG tablet Take 1 tablet (500 mcg total) by mouth daily.     glucose blood (FREESTYLE LITE) test strip Use as instructed, test blood sugar once a day 100 each 12   Insulin Pen Needle 32G X 4 MM MISC Use 1x a day 100 each 3   Lancets MISC Use 1x a day 100 each 3   Melatonin 10 MG TABS Take 1 tablet by mouth at bedtime.     thiamine (VITAMIN B1) 100 MG tablet Take 1 tablet (100 mg total) by mouth daily.     venlafaxine XR (EFFEXOR-XR) 37.5 MG 24 hr capsule Take 1  capsule (37.5 mg total) by mouth daily.     No current facility-administered medications on file prior to visit.   Allergies  Allergen Reactions   Amoxicillin Anaphylaxis and Other (See Comments)    Has patient had a PCN reaction causing immediate rash, facial/tongue/throat swelling, SOB or lightheadedness with hypotension: Yes Has patient had a PCN reaction causing severe rash involving mucus  membranes or skin necrosis: No Has patient had a PCN reaction that required hospitalization No Has patient had a PCN reaction occurring within the last 10 years: No If all of the above answers are "NO", then [name] proceed with Cephalosporin use.  Had Ceftriaxone IM in 2021   Ciprofloxacin Anaphylaxis    Other reaction(s): anaphylaxis   Other Anaphylaxis    Pt states that she can only take Avelox, and Zithromax.     Penicillins Anaphylaxis and Other (See Comments)    Has patient had a PCN reaction causing immediate rash, facial/tongue/throat swelling, SOB or lightheadedness with hypotension: Yes Has patient had a PCN reaction causing severe rash involving mucus membranes or skin necrosis: No Has patient had a PCN reaction that required hospitalization No Has patient had a PCN reaction occurring within the last 10 years: No If all of the above answers are "NO", then [name] proceed with Cephalosporin use.   Sulfa Antibiotics Anaphylaxis and Other (See Comments)    Pt states she can take sulfa abx at this time   Erythromycin Other (See Comments)    GI upset with oral regimen over 10 years ago Other reaction(s): anaphylaxis   Vicodin [Hydrocodone-Acetaminophen] Itching   Family History  Adopted: Yes  Problem Relation Age of Onset   Other Other        adopted   PE: BP 120/70   Pulse 98   Ht 5' (1.524 m)   Wt 173 lb 9.6 oz (78.7 kg)   SpO2 98%   BMI 33.90 kg/m  Wt Readings from Last 30 Encounters:  09/27/23 173 lb 9.6 oz (78.7 kg)  06/01/23 146 lb 9.7 oz (66.5 kg)  05/24/23 136 lb (61.7 kg)   01/04/23 161 lb 9.6 oz (73.3 kg)  08/31/22 176 lb 6.4 oz (80 kg)  05/20/22 181 lb 6.4 oz (82.3 kg)  04/23/22 182 lb 1.6 oz (82.6 kg)  04/20/22 180 lb 3.2 oz (81.7 kg)  09/15/21 210 lb 3.2 oz (95.3 kg)  04/03/21 210 lb 3.2 oz (95.3 kg)  01/29/21 215 lb (97.5 kg)  11/18/20 242 lb 6.4 oz (110 kg)  08/12/20 234 lb 9.6 oz (106.4 kg)  07/15/20 236 lb 12.8 oz (107.4 kg)  01/26/20 223 lb 12.8 oz (101.5 kg)  01/16/20 228 lb 8 oz (103.6 kg)  01/15/20 (!) 217 lb (98.4 kg)  11/21/19 217 lb (98.4 kg)  06/08/19 218 lb 4 oz (99 kg)  03/22/19 203 lb (92.1 kg)  03/20/19 200 lb (90.7 kg)  03/13/19 203 lb 12 oz (92.4 kg)  12/12/18 211 lb (95.7 kg)  08/01/18 231 lb (104.8 kg)  07/07/18 238 lb (108 kg)  05/19/18 242 lb 12 oz (110.1 kg)  04/29/18 244 lb (110.7 kg)  04/07/18 242 lb (109.8 kg)  12/20/17 247 lb 9.6 oz (112.3 kg)  10/31/17 240 lb (108.9 kg)  Highest: 285 lbs during pregnancy  Constitutional: normal weight, in NAD Eyes:  EOMI, no exophthalmos ENT: no neck masses, no cervical lymphadenopathy Cardiovascular: tachycardia, RR, No MRG Respiratory: CTA B Musculoskeletal: no deformities Skin:no rashes Neurological: no tremor with outstretched hands  ASSESSMENT: 1. DM2, insulin-dependent, controlled, without long-term complications, but with hyperglycemia  Investigation for type 1 diabetes was negative: Component     Latest Ref Rng & Units 08/01/2018  C-Peptide     0.80 - 3.85 ng/mL 2.53  Islet Cell Ab     Neg:<1:1 Negative  Glucose, Plasma     65 - 99 mg/dL 829 (H)  ZNT8 Antibodies  U/mL <15  Glutamic Acid Decarb Ab     <5 IU/mL <5   2.  Hyperlipidemia  3.  Obesity class I  PLAN:  1. Patient with longstanding, previously uncontrolled type 2 diabetes, with significant improvement in control on a GLP-1/GIP receptor agonist and metformin, and with significant weight loss in the last few years.  At last visit, HbA1c was excellent, and due to the significant weight loss, I  recommended to decrease the Mounjaro dose from 10 to 5 mg weekly.  We continued the metformin dose.  Since then, she had an admission on 05/29/2019 for for dehydration, AKI, with a very high creatinine.  Of note, she also had high lipase at 152.  This followed 2 weeks of very poor diet.  At last visit she mentioned that she was not able to eat well due to dysgeusia (everything was tasting bitter but not necessarily to [facility]).  She is currently off her diabetic medications. CGM interpretation: -At today's visit, we reviewed her CGM downloads from 2 mo ago (she stopped the sensor since, but mentions that the sugars are very similar now): It appears that 81% of values are in target range (goal >70%), while 19% are higher than 180 (goal <25%), and 0% are lower than 70 (goal <4%).  The calculated average blood sugar is 161.  The projected HbA1c for the next 3 months (GMI) is 7.2%. -Reviewing the CGM trends, sugars appear to be fluctuating in the upper half of the target range with occasional hyperglycemic spikes in the 200s.  As of now, she is adjusting her diet and she also started some supplements that can lower her blood sugars (berberine, chromium, and will start ALA).  I suggested to continue without diabetic medications for now, but to restart the sensor.  I would like to see her back in approximately 4 months and at that time we [name] need to start back on the low-dose metformin.  She agrees with the plan. - I suggested to:  Patient Instructions  Please continue off Metformin and Mounjaro.  Restart the CGM.  Please come back for a follow-up appointment in 4 months.  - we checked her HbA1c: 6.2% (higher but still at goal) - advised to check sugars at different times of the day - 4x a day, rotating check times - advised for yearly eye exams >> she is UTD - return to clinic in 4 months  2.  Hyperlipidemia - Reviewed latest lipid panel from 05/2023: LDL at goal, triglycerides slightly high, HDL  slightly low: Lab Results  Component Value Date   CHOL 145 05/24/2023   HDL 48 (L) 05/24/2023   LDLCALC 71 05/24/2023   LDLDIRECT 125.0 06/08/2019   TRIG 183 (H) 05/24/2023   CHOLHDL 3.0 05/24/2023  - She previous intolerance to statins.  We tried Zetia but she developed muscle aches and stopped. - I previously recommended the "portfolio diet"  3.  Obesity class I -She was on Mounjaro, on which she lost almost 200 pounds.  However, her decreased appetite was so significant that she was admitted 05/29/2023 with AKI, dehydration, hyponatremia and a high lipase of 152.  This followed 2 weeks of very low p.o.  She was taken off metformin and Mounjaro.  For now, we will continue without these until her body recovers.  Continue to pay attention to the diet but more importantly, stay hydrated.  Carlus Pavlov, MD PhD [facility] End[facility]

## 2023-11-18 ENCOUNTER — Encounter: Payer: Self-pay | Admitting: Internal Medicine

## 2023-11-18 MED ORDER — DEXCOM G7 SENSOR MISC: 3 refills | Status: AC

## 2023-11-19 MED ORDER — METFORMIN HCL 1000 MG PO TABS: 1000.0000 mg | ORAL_TABLET | Freq: Two times a day (BID) | ORAL | 2 refills | Status: AC

## 2024-01-28 ENCOUNTER — Telehealth: Payer: Self-pay

## 2024-01-28 ENCOUNTER — Encounter: Payer: Self-pay | Admitting: Internal Medicine

## 2024-01-28 ENCOUNTER — Ambulatory Visit (INDEPENDENT_AMBULATORY_CARE_PROVIDER_SITE_OTHER): Admitting: Internal Medicine

## 2024-01-28 VITALS — BP 110/80 | HR 94 | Ht 60.0 in | Wt 173.0 lb

## 2024-01-28 DIAGNOSIS — E66811 Obesity, class 1: Secondary | ICD-10-CM | POA: Diagnosis not present

## 2024-01-28 DIAGNOSIS — E119 Type 2 diabetes mellitus without complications: Secondary | ICD-10-CM | POA: Diagnosis not present

## 2024-01-28 DIAGNOSIS — Z7984 Long term (current) use of oral hypoglycemic drugs: Secondary | ICD-10-CM

## 2024-01-28 DIAGNOSIS — E785 Hyperlipidemia, unspecified: Secondary | ICD-10-CM | POA: Diagnosis not present

## 2024-01-28 LAB — POCT GLYCOSYLATED HEMOGLOBIN (HGB A1C): Hemoglobin A1C: 6.8 % — AB (ref 4.0–5.6)

## 2024-01-28 MED ORDER — EMPAGLIFLOZIN 10 MG PO TABS: 10.0000 mg | ORAL_TABLET | Freq: Every day | ORAL | 3 refills | Status: AC

## 2024-01-28 NOTE — Progress Notes (Addendum)
 Patient ID: Greig LITTIE Sar, female   DOB: 1966/05/12, 58 y.o.   MRN: [id number]   HPI: COLLIN HENDLEY is a 58 y.o.-year-old female, returning for f/u for DM2, dx in 1997, insulin-dependent until recently, controlled, without long-term complications. Last OV 4 months ago.  Interim history: No increased urination, blurry vision, chest pain.   Last year, she had significant weight loss but mentioned  that this was partly due to dysgeusia not decreased appetite necessarily - everything was tasting bitter even since her Bell's palsy 02/2022. She was admitted with AKI with creatinine above 5 along with hyponatremia and a high lipase on 05/29/2023.  A cosyntropin stimulation test was normal at that time.  She was taken off metformin and Mounjaro.  She continues off these.  Since then, she started eating and drinking better.  Before last visit she gained approximately 30 pounds back and was feeling much better. 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  She was started on  supplements - now only on ALA. She had Ozone treatments >> now has joint pains.  Reviewed HbA1c levels: Lab Results  Component Value Date   HGBA1C 6.2 (A) 09/27/2023   HGBA1C 5.2 05/24/2023   HGBA1C 5.1 01/04/2023  08/24/2022: HbA1c 5.3% 1997: HbA1c 11%  She was previously on: - Metformin ER  1000 mg 2x a day with meals >> OFF - Trulicity 1.5 >> 3 >> 4.5 mg weekly >> Mounjaro 5 >> 10 >> 5 mg weekly >> OFF Previously on JanuMet. She had nausea with metformin. She was also previously on Toujeo and regular insulin.  Pt was checking sugars 4x a day with the Dexcom G7:  Previously:  Previously:   Lowest sugar was 53 (but with glucometer: 80s)  >> 61 >> 75 >> 90s; she has hypoglycemia awareness in the 70s. Highest sugar was 380 (steroids) >> 200 >> 190 >> 200  Glucometer: RelioN >> Freestyle lite  -No CKD; last BUN/creatinine:   Lab Results  Component Value Date   BUN 15 06/01/2023   BUN 29 (H)  05/31/2023   CREATININE 1.49 (H) 06/01/2023   CREATININE 2.06 (H) 05/31/2023   Lab Results  Component Value Date   MICRALBCREAT 9 05/24/2023  On telmisartan 40.  -+ HL; last set of lipids: Lab Results  Component Value Date   CHOL 145 05/24/2023   HDL 48 (L) 05/24/2023   LDLCALC 71 05/24/2023   LDLDIRECT 125.0 06/08/2019   TRIG 183 (H) 05/24/2023   CHOLHDL 3.0 05/24/2023  She could not tolerate statins and Zetia in the past.  She has fatty liver and a history of transaminitis, But latest LFTs were normal: Lab Results  Component Value Date   ALT 17 05/29/2023   AST 17 05/29/2023   ALKPHOS 57 05/29/2023   BILITOT 0.5 05/29/2023   - last eye exam was in 2025: No DR. + cataract.  - no numbness and tingling in her feet.  Last foot exam 05/24/2023.  Pt is adopted - ? FH of DM.  08/24/2022: TSH 1.277  She had COVID-19 in 06/2020.  She had prednisone at that time and sugars increased to the 300s. She lost 55 lbs in 2019 ([facility]), then gained back 40 pounds. She mentions an allergy to almost all antibiotics, but she can take Levaquin, Z-Pack, Avelox, doxycycline. She was in the emergency room with rectal bleeding 01/29/2021.  She had a CT scan of the abdomen which showed diverticulosis, but without diverticulitis.  She had  abdominal skin removal 09/2022.  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  ROS: + See HPI  I reviewed pt's medications, allergies, PMH, social hx, family hx, and changes were documented in the history of present illness. Otherwise, unchanged from my initial visit note.  Past Medical History:  Diagnosis Date   Asthma    Bell's palsy    Complication of anesthesia    Diabetes mellitus without complication (HCC)    Type II   Dysrhythmia    Tachycardia   GERD (gastroesophageal reflux disease)    Pneumonia    2014- 'Walking   Shortness of breath dyspnea    with exertion   Stroke [facility])    Vertigo    Past Surgical History:  Procedure Laterality Date    CHOLECYSTECTOMY  1997   COLONOSCOPY  2015   CYSTOSCOPY     age 38- stretched stem of bladder   EYE SURGERY     Baby- Strabismus   SHOULDER ARTHROSCOPY Left 11/20/2014   Procedure: ARTHROSCOPY SHOULDER WITH MUA, ROTATOR INTERVAL RELEASE;  Surgeon: Glendia Cordella Hutchinson, MD;  Location: [facility] OR;  Service: Orthopedics;  Laterality: Left;  LEFT SHOULDER MUA, DOA, ROTATOR INTERVAL RELEASE.   TONSILLECTOMY  2010   Social History   Socioeconomic History   Marital status: Married    Spouse name: Not on file   Number of children: 1   Years of education: Not on file   Highest education level: Not on file  Occupational History     Tobacco Use   Smoking status: Never Smoker   Smokeless tobacco: Never Used  Substance and Sexual Activity   Alcohol use: Yes    Alcohol/week: 0.0 oz    Comment: rare   Drug use: No      Social History Narrative   Married.   1 daughter.   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    Current Outpatient Medications on File Prior to Visit  Medication Sig Dispense Refill   albuterol (VENTOLIN HFA) 108 (90 Base) MCG/ACT inhaler Inhale 2 puffs into the lungs every 4 (four) hours as needed for wheezing or shortness of breath. (Patient not taking: Reported on 09/27/2023) 18 g 0   aspirin 81 MG chewable tablet Chew 1 tablet (81 mg total) by mouth daily. (Patient not taking: Reported on 09/27/2023)     blood glucose meter kit and supplies KIT Dispense based on patient and insurance preference. Use 1x a day. (FOR ICD-9 250.00, 250.01). 1 each 0   Continuous Glucose Sensor (DEXCOM G7 SENSOR) MISC Apply 1 sensor every 10 days 9 each 3   cyanocobalamin (VITAMIN B12) 500 MCG tablet Take 1 tablet (500 mcg total) by mouth daily.     glucose blood (FREESTYLE LITE) test strip Use as instructed, test blood sugar once a day 100 each 12   Insulin Pen Needle 32G X 4 MM MISC Use 1x a day 100 each 3   Lancets MISC Use 1x a day 100 each 3   Melatonin 10 MG TABS Take  1 tablet by mouth at bedtime.     metFORMIN (GLUCOPHAGE) 1000 MG tablet Take 1 tablet (1,000 mg total) by mouth 2 (two) times daily. 120 tablet 2   thiamine (VITAMIN B1) 100 MG tablet Take 1 tablet (100 mg total) by mouth daily.     venlafaxine XR (EFFEXOR-XR) 37.5 MG 24 hr capsule Take 1 capsule (37.5 mg total) by mouth daily.     No current  facility-administered medications on file prior to visit.   Allergies  Allergen Reactions   Amoxicillin Anaphylaxis and Other (See Comments)    Has patient had a PCN reaction causing immediate rash, facial/tongue/throat swelling, SOB or lightheadedness with hypotension: Yes Has patient had a PCN reaction causing severe rash involving mucus membranes or skin necrosis: No Has patient had a PCN reaction that required hospitalization No Has patient had a PCN reaction occurring within the last 10 years: No If all of the above answers are NO, then may proceed with Cephalosporin use.  Had Ceftriaxone IM in 2021   Ciprofloxacin Anaphylaxis    Other reaction(s): anaphylaxis   Other Anaphylaxis    Pt states that she can only take Avelox, and Zithromax.     Penicillins Anaphylaxis and Other (See Comments)    Has patient had a PCN reaction causing immediate rash, facial/tongue/throat swelling, SOB or lightheadedness with hypotension: Yes Has patient had a PCN reaction causing severe rash involving mucus membranes or skin necrosis: No Has patient had a PCN reaction that required hospitalization No Has patient had a PCN reaction occurring within the last 10 years: No If all of the above answers are NO, then may proceed with Cephalosporin use.   Sulfa Antibiotics Anaphylaxis and Other (See Comments)    Pt states she can take sulfa abx at this time   Erythromycin Other (See Comments)    GI upset with oral regimen over 10 years ago Other reaction(s): anaphylaxis   Vicodin [Hydrocodone-Acetaminophen] Itching   Family History  Adopted: Yes  Problem Relation  Age of Onset   Other Other        adopted   PE: BP 110/80   Pulse 94   Ht 5' (1.524 m)   Wt 173 lb (78.5 kg)   SpO2 96%   BMI 33.79 kg/m  Wt Readings from Last 30 Encounters:  01/28/24 173 lb (78.5 kg)  09/27/23 173 lb 9.6 oz (78.7 kg)  06/01/23 146 lb 9.7 oz (66.5 kg)  05/24/23 136 lb (61.7 kg)  01/04/23 161 lb 9.6 oz (73.3 kg)  08/31/22 176 lb 6.4 oz (80 kg)  05/20/22 181 lb 6.4 oz (82.3 kg)  04/23/22 182 lb 1.6 oz (82.6 kg)  04/20/22 180 lb 3.2 oz (81.7 kg)  09/15/21 210 lb 3.2 oz (95.3 kg)  04/03/21 210 lb 3.2 oz (95.3 kg)  01/29/21 215 lb (97.5 kg)  11/18/20 242 lb 6.4 oz (110 kg)  08/12/20 234 lb 9.6 oz (106.4 kg)  07/15/20 236 lb 12.8 oz (107.4 kg)  01/26/20 223 lb 12.8 oz (101.5 kg)  01/16/20 228 lb 8 oz (103.6 kg)  01/15/20 (!) 217 lb (98.4 kg)  11/21/19 217 lb (98.4 kg)  06/08/19 218 lb 4 oz (99 kg)  03/22/19 203 lb (92.1 kg)  03/20/19 200 lb (90.7 kg)  03/13/19 203 lb 12 oz (92.4 kg)  12/12/18 211 lb (95.7 kg)  08/01/18 231 lb (104.8 kg)  07/07/18 238 lb (108 kg)  05/19/18 242 lb 12 oz (110.1 kg)  04/29/18 244 lb (110.7 kg)  04/07/18 242 lb (109.8 kg)  12/20/17 247 lb 9.6 oz (112.3 kg)  Highest: 285 lbs during pregnancy  Constitutional: normal weight, in NAD Eyes:  EOMI, no exophthalmos ENT: no neck masses, no cervical lymphadenopathy Cardiovascular: tachycardia, RR, No MRG Respiratory: CTA B Musculoskeletal: no deformities Skin:no rashes Neurological: no tremor with outstretched hands Diabetic Foot Exam - Simple   Simple Foot Form Diabetic Foot exam was performed with the following  findings: Yes 01/28/2024  9:04 AM  Visual Inspection No deformities, no ulcerations, no other skin breakdown bilaterally: Yes Sensation Testing Intact to touch and monofilament testing bilaterally: Yes Pulse Check Posterior Tibialis and Dorsalis pulse intact bilaterally: Yes Comments    ASSESSMENT: 1. DM2, non insulin-dependent, controlled, without long-term  complications, but with hyperglycemia  Investigation for type 1 diabetes was negative: Component     Latest Ref Rng & Units 08/01/2018  C-Peptide     0.80 - 3.85 ng/mL 2.53  Islet Cell Ab     Neg:<1:1 Negative  Glucose, Plasma     65 - 99 mg/dL 899 (H)  ZNT8 Antibodies     U/mL <15  Glutamic Acid Decarb Ab     <5 IU/mL <5   2.  Hyperlipidemia  3.  Obesity class I  PLAN:  1. Patient with longstanding, previously uncontrolled type 2 diabetes, with significant improvement in control on a GLP-1/GIP receptor agonist and metformin, with drastic weight loss in the last several years.  However, after a period of very poor oral intake, she had an admission for dehydration, AKI, and a high lipase so she is now both Mounjaro and metformin. - At last visit, HbA1c was only slightly higher, at 6.2% and sugars were still at goal but fluctuating in the upper half of the target range with only occasional hyperglycemic spikes in the 200s.  She was adjusting her diet and she also started some supplements that could lower her blood sugars (containing berberine, chromium, and ALA).  We continued without diabetic medications. CGM interpretation: -At today's visit, we reviewed her CGM downloads: It appears that 76% of values are in target range (goal >70%), while 24% are higher than 180 (goal <25%), and 0% are lower than 70 (goal <4%).  The calculated average blood sugar is 153.  The projected HbA1c for the next 3 months (GMI) is 7.0%. -Reviewing the CGM trends, sugars appear to still be fluctuating within the higher range, mostly at goal last month, but slightly more fluctuating approximately 2 weeks ago, with occasional hyperglycemic spikes after breakfast and lunch.  We did discuss about the possibility of using metformin again, however, she is also worried about her kidney function and also weight gain.  In that case, I suggested an SGLT2 inhibitor, which can help with both.  Discussed about benefits and  possible side effects.  Advised her to stay well-hydrated while on this. - I suggested to:  Patient Instructions  Please start: - Jardiance 10 mg daily before b'fast  Please come back for a follow-up appointment in 4 months.  - we checked her HbA1c: 6.8% (lower) - advised to check sugars at different times of the day - 4x a day, rotating check times - advised for yearly eye exams >> she is UTD - will check annual labs today - return to clinic in 4 months  2.  Hyperlipidemia - Latest lipid panel from 05/2023 showed an LDL at goal, triglycerides slightly high, and HDL slightly low: Lab Results  Component Value Date   CHOL 145 05/24/2023   HDL 48 (L) 05/24/2023   LDLCALC 71 05/24/2023   LDLDIRECT 125.0 06/08/2019   TRIG 183 (H) 05/24/2023   CHOLHDL 3.0 05/24/2023  - She previously had intolerance to statins.  We tried Zetia but she developed muscle aches and had to stop. - I previously recommended the portfolio diet  3.  Obesity class I -She was on Mounjaro, which she lost almost 200 pounds.  However, her  p.o. intake was so low that she was admitted in 05/2023 with AKI, dehydration, hyponatremia, and had a high lipase of 152.  This followed 2 weeks of very low p.o.  She was taken off metformin and Mounjaro.  At last visit, we continued off these medications.  I advised her to stay very well-hydrated. - However, before last visit, she gained 27 pounds - At today's visit, weight is stable  Needs refills for CGM from [facility].  Component     Latest Ref Rng 01/28/2024  Sodium     135 - 146 mmol/L 138   Potassium     3.5 - 5.3 mmol/L 4.1   Chloride     98 - 110 mmol/L 101   CO2     20 - 32 mmol/L 28   Glucose     65 - 99 mg/dL 852 (H)   BUN     7 - 25 mg/dL 18   Creatinine     9.49 - 1.03 mg/dL 8.69 (H)   Total Bilirubin     0.2 - 1.2 mg/dL 0.5   AST     10 - 35 U/L 25   ALT     6 - 29 U/L 23   Total Protein     6.1 - 8.1 g/dL 7.3   Calcium     8.6 - 10.4 mg/dL  9.5   Cholesterol     <200 mg/dL 774 (H)   Triglycerides     <150 mg/dL 897   HDL Cholesterol     > OR = 50 mg/dL 72   Total CHOL/HDL Ratio     <5.0 (calc) 3.1   Hemoglobin A1C     4.0 - 5.6 % 6.8 !   eGFR     > OR = 60 mL/min/1.22m2 49 (L)   BUN/Creatinine Ratio     6 - 22 (calc) 14   Albumin MSPROF     3.6 - 5.1 g/dL 4.4   Globulin     1.9 - 3.7 g/dL (calc) 2.9   AG Ratio     1.0 - 2.5 (calc) 1.5   Alkaline phosphatase (APISO)     37 - 153 U/L 98   Creatinine, Urine     20 - 275 mg/dL 866   Microalb, Ur     mg/dL 0.3   MICROALB/CREAT RATIO     <30 mg/g creat 2   LDL Cholesterol (Calc)     mg/dL (calc) 866 (H)   Non-HDL Cholesterol (Calc)     <130 mg/dL (calc) 846 (H)   UACR is not elevated but GFR is slightly lower.  We are starting Farxiga.  LDL is also elevated.  She would absolutely benefit from a statin.  Will discuss with her if she wants to try a low-dose of Crestor once a week.  Lela Fendt, MD PhD [facility] Endocrinology

## 2024-01-28 NOTE — Telephone Encounter (Signed)
 MD requested patient's sensors be ordered on [employer] to her DME supply company, however due to patient not qualifying based on certain questions, the app would not proceed with allowing the order to be placed. MD made aware. Patient called and sent Mychart message to determine if she would like the order sent to a local pharmacy to see if order can placed that way. Awaiting to hear from patient. VM left

## 2024-01-28 NOTE — Patient Instructions (Addendum)
 Please start: - Jardiance  10 mg daily before b'fast  Please come back for a follow-up appointment in 4 months.

## 2024-01-29 LAB — LIPID PANEL W/REFLEX DIRECT LDL
Cholesterol: 225 mg/dL — ABNORMAL HIGH (ref <200)
HDL: 72 mg/dL (ref >=50)
LDL Cholesterol (Calc): 133 mg/dL — ABNORMAL HIGH
Non-HDL Cholesterol (Calc): 153 mg/dL — ABNORMAL HIGH (ref <130)
Total CHOL/HDL Ratio: 3.1 (calc) (ref <5.0)
Triglycerides: 102 mg/dL (ref <150)

## 2024-01-29 LAB — COMPREHENSIVE METABOLIC PANEL WITH GFR
AG Ratio: 1.5 (calc) (ref 1.0–2.5)
ALT: 23 U/L (ref 6–29)
AST: 25 U/L (ref 10–35)
Albumin: 4.4 g/dL (ref 3.6–5.1)
Alkaline phosphatase (APISO): 98 U/L (ref 37–153)
BUN/Creatinine Ratio: 14 (calc) (ref 6–22)
BUN: 18 mg/dL (ref 7–25)
CO2: 28 mmol/L (ref 20–32)
Calcium: 9.5 mg/dL (ref 8.6–10.4)
Chloride: 101 mmol/L (ref 98–110)
Creat: 1.3 mg/dL — ABNORMAL HIGH (ref 0.50–1.03)
Globulin: 2.9 g/dL (ref 1.9–3.7)
Glucose, Bld: 147 mg/dL — ABNORMAL HIGH (ref 65–99)
Potassium: 4.1 mmol/L (ref 3.5–5.3)
Sodium: 138 mmol/L (ref 135–146)
Total Bilirubin: 0.5 mg/dL (ref 0.2–1.2)
Total Protein: 7.3 g/dL (ref 6.1–8.1)
eGFR: 49 mL/min/1.73m2 — ABNORMAL LOW (ref >=60)

## 2024-01-29 LAB — MICROALBUMIN / CREATININE URINE RATIO
Creatinine, Urine: 133 mg/dL (ref 20–275)
Microalb Creat Ratio: 2 mg/g{creat} (ref <30)
Microalb, Ur: 0.3 mg/dL

## 2024-01-30 ENCOUNTER — Ambulatory Visit: Payer: Self-pay | Admitting: Internal Medicine

## 2024-06-05 ENCOUNTER — Other Ambulatory Visit: Payer: Self-pay | Admitting: Internal Medicine

## 2024-06-05 ENCOUNTER — Encounter: Payer: Self-pay | Admitting: Internal Medicine

## 2024-06-05 ENCOUNTER — Ambulatory Visit: Admitting: Internal Medicine

## 2024-06-05 VITALS — BP 120/80 | HR 101 | Ht 60.0 in | Wt 209.8 lb

## 2024-06-05 DIAGNOSIS — Z7984 Long term (current) use of oral hypoglycemic drugs: Secondary | ICD-10-CM

## 2024-06-05 DIAGNOSIS — E66813 Obesity, class 3: Secondary | ICD-10-CM

## 2024-06-05 DIAGNOSIS — E785 Hyperlipidemia, unspecified: Secondary | ICD-10-CM

## 2024-06-05 DIAGNOSIS — E1165 Type 2 diabetes mellitus with hyperglycemia: Secondary | ICD-10-CM

## 2024-06-05 DIAGNOSIS — Z794 Long term (current) use of insulin: Secondary | ICD-10-CM

## 2024-06-05 DIAGNOSIS — E66811 Obesity, class 1: Secondary | ICD-10-CM

## 2024-06-05 LAB — POCT GLYCOSYLATED HEMOGLOBIN (HGB A1C): Hemoglobin A1C: 7.1 % — AB (ref 4.0–5.6)

## 2024-06-05 MED ORDER — TOUJEO MAX SOLOSTAR 300 UNIT/ML ~~LOC~~ SOPN: 12.0000 [IU] | PEN_INJECTOR | Freq: Every day | SUBCUTANEOUS | 3 refills | Status: AC

## 2024-06-05 MED ORDER — INSULIN PEN NEEDLE 32G X 4 MM MISC: 3 refills | Status: DC

## 2024-06-05 MED ORDER — TOUJEO MAX SOLOSTAR 300 UNIT/ML ~~LOC~~ SOPN: 12.0000 [IU] | PEN_INJECTOR | Freq: Every day | SUBCUTANEOUS | 3 refills | Status: DC

## 2024-06-05 NOTE — Patient Instructions (Addendum)
 Please continue: - Jardiance  10 mg daily before b'fast  Start: - Toujeo  12 units at night - increase the dose by 2-4 units every 2-4 days until sugars in am are <130  Please come back for a follow-up appointment in 3 months.

## 2024-06-05 NOTE — Progress Notes (Signed)
 Patient ID: Melissa [name], female   DOB: 02/29/1966, 58 y.o.   MRN: [id number]   HPI: Melissa [name] is a 58 y.o.-year-old female, returning for f/u for DM2, dx in 1997, insulin -dependent, controlled, without long-term complications. Last OV 4 months ago.  Interim history: No increased urination, blurry vision, chest pain.   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  She was started on  supplements - now only on ALA. She had Ozone treatments >> developed joint pains. Sugars are higher - now on Prednisone  for 2 weeks for back pain/radiculopathy/iliotibial band sd.  She finished the course last night.  This helped.  Reviewed HbA1c levels: 03/2024 reportedly: HbA1c 6.2% Lab Results  Component Value Date   HGBA1C 6.8 (A) 01/28/2024   HGBA1C 6.2 (A) 09/27/2023   HGBA1C 5.2 05/24/2023  08/24/2022: HbA1c 5.3% 1997: HbA1c 11%  She was previously on: - Metformin  ER  1000 mg 2x a day with meals >> OFF - Trulicity  1.5 >> 3 >> 4.5 mg weekly >> Mounjaro  5 >> 10 >> 5 mg weekly >> OFF Previously on JanuMet . She had nausea with metformin . She was also previously on Toujeo  and regular insulin . In 2024, she had significant weight loss but mentioned  that this was partly due to dysgeusia not decreased appetite necessarily - everything was tasting bitter even since her Bell's palsy 02/2022. She was admitted with AKI with creatinine above 5 along with hyponatremia and a high lipase on 05/29/2023.  A cosyntropin  stimulation test was normal at that time.  She was taken off metformin  and Mounjaro . Since then, she started eating and drinking better >. gained approximately 30 pounds back and was feeling much better.   At last visit we started:  - Jardiance  10 mg before breakfast  Pt was checking sugars 4x a day with the Dexcom G7:  Previously:  Previously:  Lowest sugar was 61 >> 75 >> 90s; she has hypoglycemia awareness in the 70s. Highest sugar was 380 (steroids) >> ... 200 >>  300s  Glucometer: RelioN >> Freestyle lite  -No CKD; last BUN/creatinine:  Lab Results  Component Value Date   BUN 18 01/28/2024   BUN 15 06/01/2023   CREATININE 1.30 (H) 01/28/2024   CREATININE 1.49 (H) 06/01/2023   Lab Results  Component Value Date   MICRALBCREAT 2 01/28/2024   MICRALBCREAT 9 05/24/2023  On telmisartan  40.  -+ HL; last set of lipids: Lab Results  Component Value Date   CHOL 225 (H) 01/28/2024   HDL 72 01/28/2024   LDLCALC 133 (H) 01/28/2024   LDLDIRECT 125.0 06/08/2019   TRIG 102 01/28/2024   CHOLHDL 3.1 01/28/2024  She could not tolerate statins and Zetia  in the past.  She declined starting a low-dose cholesterol once a week.  She has fatty liver and a history of transaminitis, But latest LFTs were normal: Lab Results  Component Value Date   ALT 23 01/28/2024   AST 25 01/28/2024   ALKPHOS 57 05/29/2023   BILITOT 0.5 01/28/2024   - last eye exam was in 2025: No DR. + cataract.  - no numbness and tingling in her feet.  Last foot exam 01/28/2024.  Pt is adopted - ? FH of DM.  08/24/2022: TSH 1.277  She had COVID-19 in 06/2020.  She had prednisone  at that time and sugars increased to the 300s. She lost 55 lbs in 2019 ([employer]), then gained back 40 pounds. She mentions an allergy to almost all antibiotics, but  she can take Levaquin, Z-Pack, Avelox, doxycycline . She was in the emergency room with rectal bleeding 01/29/2021.  She had a CT scan of the abdomen which showed diverticulosis, but without diverticulitis.  She had abdominal skin removal 09/2022.  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  ROS: + See HPI  I reviewed pt's medications, allergies, PMH, social hx, family hx, and changes were documented in the history of present illness. Otherwise, unchanged from my initial visit note.  Past Medical History:  Diagnosis Date   Asthma    Bell's palsy    Complication of anesthesia    Diabetes mellitus without complication (HCC)    Type II    Dysrhythmia    Tachycardia   GERD (gastroesophageal reflux disease)    Pneumonia    2014- 'Walking   Shortness of breath dyspnea    with exertion   Stroke [facility])    Vertigo    Past Surgical History:  Procedure Laterality Date   CHOLECYSTECTOMY  1997   COLONOSCOPY  2015   CYSTOSCOPY     age 46- stretched stem of bladder   EYE SURGERY     Baby- Strabismus   SHOULDER ARTHROSCOPY Left 11/20/2014   Procedure: ARTHROSCOPY SHOULDER WITH MUA, ROTATOR INTERVAL RELEASE;  Surgeon: Glendia Cordella Hutchinson, MD;  Location: [facility] OR;  Service: Orthopedics;  Laterality: Left;  LEFT SHOULDER MUA, DOA, ROTATOR INTERVAL RELEASE.   TONSILLECTOMY  2010   Social History   Socioeconomic History   Marital status: Married    Spouse name: Not on file   Number of children: 1   Years of education: Not on file   Highest education level: Not on file  Occupational History     Tobacco Use   Smoking status: Never Smoker   Smokeless tobacco: Never Used  Substance and Sexual Activity   Alcohol  use: Yes    Alcohol /week: 0.0 oz    Comment: rare   Drug use: No      Social History Narrative   Married.   1 daughter.   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    Current Outpatient Medications on File Prior to Visit  Medication Sig Dispense Refill   albuterol  (VENTOLIN  HFA) 108 (90 Base) MCG/ACT inhaler Inhale 2 puffs into the lungs every 4 (four) hours as needed for wheezing or shortness of breath. (Patient not taking: Reported on 09/27/2023) 18 g 0   aspirin  81 MG chewable tablet Chew 1 tablet (81 mg total) by mouth daily. (Patient not taking: Reported on 09/27/2023)     blood glucose meter kit and supplies KIT Dispense based on patient and insurance preference. Use 1x a day. (FOR ICD-9 250.00, 250.01). 1 each 0   Continuous Glucose Sensor (DEXCOM G7 SENSOR) MISC Apply 1 sensor every 10 days 9 each 3   cyanocobalamin  (VITAMIN B12) 500 MCG tablet Take 1 tablet (500 mcg total) by mouth daily.      empagliflozin  (JARDIANCE ) 10 MG TABS tablet Take 1 tablet (10 mg total) by mouth daily before breakfast. 90 tablet 3   glucose blood (FREESTYLE LITE) test strip Use as instructed, test blood sugar once a day 100 each 12   Insulin  Pen Needle 32G X 4 MM MISC Use 1x a day 100 each 3   Lancets MISC Use 1x a day 100 each 3   Melatonin 10 MG TABS Take 1 tablet by mouth at bedtime.     metFORMIN  (GLUCOPHAGE ) 1000 MG tablet Take 1  tablet (1,000 mg total) by mouth 2 (two) times daily. 120 tablet 2   thiamine  (VITAMIN B1) 100 MG tablet Take 1 tablet (100 mg total) by mouth daily.     venlafaxine  XR (EFFEXOR -XR) 37.5 MG 24 hr capsule Take 1 capsule (37.5 mg total) by mouth daily.     No current facility-administered medications on file prior to visit.   Allergies  Allergen Reactions   Amoxicillin  Anaphylaxis and Other (See Comments)    Has patient had a PCN reaction causing immediate rash, facial/tongue/throat swelling, SOB or lightheadedness with hypotension: Yes Has patient had a PCN reaction causing severe rash involving mucus membranes or skin necrosis: No Has patient had a PCN reaction that required hospitalization No Has patient had a PCN reaction occurring within the last 10 years: No If all of the above answers are NO, then may proceed with Cephalosporin use.  Had Ceftriaxone  IM in 2021   Ciprofloxacin Anaphylaxis    Other reaction(s): anaphylaxis   Other Anaphylaxis    Pt states that she can only take Avelox, and Zithromax .     Penicillins Anaphylaxis and Other (See Comments)    Has patient had a PCN reaction causing immediate rash, facial/tongue/throat swelling, SOB or lightheadedness with hypotension: Yes Has patient had a PCN reaction causing severe rash involving mucus membranes or skin necrosis: No Has patient had a PCN reaction that required hospitalization No Has patient had a PCN reaction occurring within the last 10 years: No If all of the above answers are NO, then may  proceed with Cephalosporin use.   Sulfa Antibiotics Anaphylaxis and Other (See Comments)    Pt states she can take sulfa abx at this time   Erythromycin  Other (See Comments)    GI upset with oral regimen over 10 years ago Other reaction(s): anaphylaxis   Vicodin [Hydrocodone -Acetaminophen ] Itching   Family History  Adopted: Yes  Problem Relation Age of Onset   Other Other        adopted   PE: BP 120/80   Pulse (!) 101   Ht 5' (1.524 m)   Wt 209 lb 12.8 oz (95.2 kg)   SpO2 97%   BMI 40.97 kg/m  Wt Readings from Last 30 Encounters:  06/05/24 209 lb 12.8 oz (95.2 kg)  01/28/24 173 lb (78.5 kg)  09/27/23 173 lb 9.6 oz (78.7 kg)  06/01/23 146 lb 9.7 oz (66.5 kg)  05/24/23 136 lb (61.7 kg)  01/04/23 161 lb 9.6 oz (73.3 kg)  08/31/22 176 lb 6.4 oz (80 kg)  05/20/22 181 lb 6.4 oz (82.3 kg)  04/23/22 182 lb 1.6 oz (82.6 kg)  04/20/22 180 lb 3.2 oz (81.7 kg)  09/15/21 210 lb 3.2 oz (95.3 kg)  04/03/21 210 lb 3.2 oz (95.3 kg)  01/29/21 215 lb (97.5 kg)  11/18/20 242 lb 6.4 oz (110 kg)  08/12/20 234 lb 9.6 oz (106.4 kg)  07/15/20 236 lb 12.8 oz (107.4 kg)  01/26/20 223 lb 12.8 oz (101.5 kg)  01/16/20 228 lb 8 oz (103.6 kg)  01/15/20 (!) 217 lb (98.4 kg)  11/21/19 217 lb (98.4 kg)  06/08/19 218 lb 4 oz (99 kg)  03/22/19 203 lb (92.1 kg)  03/20/19 200 lb (90.7 kg)  03/13/19 203 lb 12 oz (92.4 kg)  12/12/18 211 lb (95.7 kg)  08/01/18 231 lb (104.8 kg)  07/07/18 238 lb (108 kg)  05/19/18 242 lb 12 oz (110.1 kg)  04/29/18 244 lb (110.7 kg)  04/07/18 242 lb (109.8 kg)  Highest: 285 lbs during pregnancy  Constitutional: normal weight, in NAD Eyes:  EOMI, no exophthalmos ENT: no neck masses, no cervical lymphadenopathy Cardiovascular: tachycardia, RR, No MRG Respiratory: CTA B Musculoskeletal: no deformities Skin:no rashes Neurological: no tremor with outstretched hands  ASSESSMENT: 1. DM2, non insulin -dependent, controlled, without long-term complications, but with  hyperglycemia  Investigation for type 1 diabetes was negative: Component     Latest Ref Rng & Units 08/01/2018  C-Peptide     0.80 - 3.85 ng/mL 2.53  Islet Cell Ab     Neg:<1:1 Negative  Glucose, Plasma     65 - 99 mg/dL 899 (H)  ZNT8 Antibodies     U/mL <15  Glutamic Acid Decarb Ab     <5 IU/mL <5   2.  Hyperlipidemia  3.  Obesity class 3  PLAN:  1. Patient with longstanding, previously uncontrolled type 2 diabetes, with significant improvement in control on a GLP-1/GIP receptor agonist and metformin , with drastic weight loss in the last several years.  However, after a period of very poor oral intake, she had an admission for dehydration, AKI, and a high lipase so she came off both Mounjaro  and metformin .  Sugars remained fairly well-controlled afterwards, with an HbA1c of 6.2% on berberine, chromium, ALA.  We continued without diabetic medications, but at last visit, HbA1c was higher, at 6.8% and sugars were fluctuating higher in the target range, or fluctuating, with occasional hyperglycemic spikes after breakfast and lunch.  We discussed about the possibility of using metformin  again but she was worried about her kidney function and also weight gain.  She agreed to start an SGLT2 inhibitor low-dose after discussion about benefits and risks.  I advised her to stay well-hydrated. CGM interpretation: -At today's visit, we reviewed her CGM downloads: It appears that 26% of values are in target range (goal >70%), while 74% are higher than 180 (goal <25%), and 0% are lower than 70 (goal <4%).  The calculated average blood sugar is 209.  The projected HbA1c for the next 3 months (GMI) is 8.3%. -Reviewing the CGM trends, sugars appear to be much higher than before, increasing after lunch and dinner and remaining elevated particularly throughout the night with a peak around 3 AM.  The increase in blood sugars is most likely related to her recent steroid course, since she had an HbA1c of 6.2%,  lower, approximately 2 months ago, before starting steroids.  Since she just finished a course, as the sugars are elevated, did not recommend mealtime insulin  but I did recommend long-acting insulin .  She was on Toujeo  before which we will try to restart.  She is aware about the risk of hypoglycemia while on insulin  and we discussed about trying to start at a low dose and increase it slightly, only if needed.  I advised her when to stop increasing the dose.  We are planning to stop this when sugars improve and hopefully she can stay off prednisone .  For now, we will continue Jardiance . -At today's visit she mentions that she is interested in restarting a low-dose Mounjaro .  We discussed about improving her blood sugars first and then trying the 2.5 mg dose and increasing it very slightly, and not up to 10 mg weekly, which caused problems with dehydration and high lipase in the past.  Of note, reportedly and her lipase level was normal at last visit with PCP she was sent the results to me.  - I suggested to:  Patient Instructions  Please continue: -  Jardiance  10 mg daily before b'fast  Start: - Toujeo  12 units at night - increase the dose by 2-4 units every 2-4 days until sugars in am are <130  Please come back for a follow-up appointment in 3 months.  - we checked her HbA1c: 7.1% (higher) - advised to check sugars at different times of the day - 4x a day, rotating check times - advised for yearly eye exams >> she is UTD - return to clinic in 3 months  2.  Hyperlipidemia - Latest lipid panel from 01/2024 showed an elevated LDL, otherwise fractions at goal: Lab Results  Component Value Date   CHOL 225 (H) 01/28/2024   HDL 72 01/28/2024   LDLCALC 133 (H) 01/28/2024   LDLDIRECT 125.0 06/08/2019   TRIG 102 01/28/2024   CHOLHDL 3.1 01/28/2024  - She previously had intolerance to statins.  We tried Zetia  but she developed muscle aches and had to stop.  At last visit I again recommended a low-dose  statin (Crestor) once a week.  She did not start this. - I previously recommended the portfolio diet  3.  Obesity class 3 -At last visit, she had obesity class I but this progressed significantly -She was on Mounjaro , which she lost almost 200 pounds.  However, her p.o. intake was so low that she was admitted in 05/2023 with AKI, dehydration, hyponatremia, and had a high lipase of 152.  This followed 2 weeks of very low p.o.  She was taken off metformin  and Mounjaro .  We then continued off these medications.  I advised her to stay very well-hydrated. - Weight was stable at last visit, she previously gained 27 pounds - since last OV, her weight increased significantly, by 30 pounds, most likely the effect of her recent steroid course but also having to come off the GLP-1/GIP receptor agonist - Plan to start Mounjaro  at next visit, with close supervision due to previous side effects.  At today's visit we discussed why this would not be indicated quite right now, when she just came off steroids and her sugars are quite elevated.  Lela Fendt, MD PhD [facility]

## 2024-06-16 ENCOUNTER — Other Ambulatory Visit: Payer: Self-pay

## 2024-06-16 DIAGNOSIS — E1165 Type 2 diabetes mellitus with hyperglycemia: Secondary | ICD-10-CM

## 2024-06-16 MED ORDER — INSULIN PEN NEEDLE 32G X 4 MM MISC: 3 refills | Status: AC

## 2024-06-23 ENCOUNTER — Encounter: Payer: Self-pay | Admitting: Internal Medicine

## 2024-09-18 ENCOUNTER — Ambulatory Visit: Admitting: Internal Medicine
